# Patient Record
Sex: Female | Born: 1965 | Race: White | Hispanic: No | Marital: Married | Smoking: Never smoker
Health system: Southern US, Community
[De-identification: ages and names within clinical notes are randomized; demographics above are authoritative.]

## PROBLEM LIST (undated history)

## (undated) DIAGNOSIS — E042 Nontoxic multinodular goiter: Secondary | ICD-10-CM

## (undated) DIAGNOSIS — K219 Gastro-esophageal reflux disease without esophagitis: Secondary | ICD-10-CM

## (undated) DIAGNOSIS — R7303 Prediabetes: Secondary | ICD-10-CM

## (undated) DIAGNOSIS — G4730 Sleep apnea, unspecified: Secondary | ICD-10-CM

## (undated) DIAGNOSIS — Z8719 Personal history of other diseases of the digestive system: Secondary | ICD-10-CM

## (undated) DIAGNOSIS — G629 Polyneuropathy, unspecified: Secondary | ICD-10-CM

## (undated) DIAGNOSIS — Z8619 Personal history of other infectious and parasitic diseases: Secondary | ICD-10-CM

## (undated) DIAGNOSIS — R112 Nausea with vomiting, unspecified: Secondary | ICD-10-CM

## (undated) HISTORY — DX: Sleep apnea, unspecified: G47.30

## (undated) HISTORY — DX: Nontoxic multinodular goiter: E04.2

## (undated) HISTORY — DX: Prediabetes: R73.03

## (undated) HISTORY — PX: TONSILLECTOMY AND ADENOIDECTOMY: SUR1326

## (undated) HISTORY — DX: Personal history of other infectious and parasitic diseases: Z86.19

## (undated) HISTORY — PX: OTHER SURGICAL HISTORY: SHX169

## (undated) HISTORY — PX: SALIVARY GLAND SURGERY: SHX768

---

## 1997-01-28 HISTORY — PX: URETHRAL SLING: SHX2621

## 1997-07-15 ENCOUNTER — Ambulatory Visit (HOSPITAL_COMMUNITY): Admission: RE | Admit: 1997-07-15 | Discharge: 1997-07-16 | Payer: Self-pay | Admitting: Urology

## 1998-07-03 ENCOUNTER — Other Ambulatory Visit: Admission: RE | Admit: 1998-07-03 | Discharge: 1998-07-03 | Payer: Self-pay | Admitting: Gynecology

## 1999-07-04 ENCOUNTER — Other Ambulatory Visit: Admission: RE | Admit: 1999-07-04 | Discharge: 1999-07-04 | Payer: Self-pay | Admitting: Gynecology

## 2000-07-07 ENCOUNTER — Other Ambulatory Visit: Admission: RE | Admit: 2000-07-07 | Discharge: 2000-07-07 | Payer: Self-pay | Admitting: Gynecology

## 2000-12-13 ENCOUNTER — Emergency Department (HOSPITAL_COMMUNITY): Admission: EM | Admit: 2000-12-13 | Discharge: 2000-12-13 | Payer: Self-pay | Admitting: Emergency Medicine

## 2001-10-15 ENCOUNTER — Other Ambulatory Visit: Admission: RE | Admit: 2001-10-15 | Discharge: 2001-10-15 | Payer: Self-pay | Admitting: Gynecology

## 2004-12-26 ENCOUNTER — Ambulatory Visit (HOSPITAL_COMMUNITY): Admission: RE | Admit: 2004-12-26 | Discharge: 2004-12-26 | Payer: Self-pay | Admitting: *Deleted

## 2006-01-17 ENCOUNTER — Ambulatory Visit (HOSPITAL_COMMUNITY): Admission: RE | Admit: 2006-01-17 | Discharge: 2006-01-17 | Payer: Self-pay | Admitting: Obstetrics & Gynecology

## 2007-02-17 ENCOUNTER — Ambulatory Visit (HOSPITAL_COMMUNITY): Admission: RE | Admit: 2007-02-17 | Discharge: 2007-02-17 | Payer: Self-pay | Admitting: Obstetrics & Gynecology

## 2007-02-24 ENCOUNTER — Encounter: Admission: RE | Admit: 2007-02-24 | Discharge: 2007-02-24 | Payer: Self-pay | Admitting: Obstetrics & Gynecology

## 2007-06-27 ENCOUNTER — Ambulatory Visit: Payer: Self-pay | Admitting: Family Medicine

## 2008-02-26 ENCOUNTER — Ambulatory Visit (HOSPITAL_COMMUNITY): Admission: RE | Admit: 2008-02-26 | Discharge: 2008-02-26 | Payer: Self-pay | Admitting: Family Medicine

## 2008-03-25 ENCOUNTER — Encounter: Admission: RE | Admit: 2008-03-25 | Discharge: 2008-03-25 | Payer: Self-pay | Admitting: Otolaryngology

## 2008-06-24 ENCOUNTER — Ambulatory Visit: Payer: Self-pay | Admitting: Gynecology

## 2008-06-28 ENCOUNTER — Ambulatory Visit: Payer: Self-pay | Admitting: Gynecology

## 2008-07-12 ENCOUNTER — Ambulatory Visit: Payer: Self-pay | Admitting: Gynecology

## 2008-07-18 ENCOUNTER — Ambulatory Visit: Payer: Self-pay | Admitting: Gynecology

## 2008-07-18 HISTORY — PX: INTRAUTERINE DEVICE INSERTION: SHX323

## 2008-09-02 ENCOUNTER — Encounter: Payer: Self-pay | Admitting: Gynecology

## 2008-09-02 ENCOUNTER — Ambulatory Visit: Payer: Self-pay | Admitting: Gynecology

## 2008-09-02 ENCOUNTER — Other Ambulatory Visit: Admission: RE | Admit: 2008-09-02 | Discharge: 2008-09-02 | Payer: Self-pay | Admitting: Gynecology

## 2008-11-11 ENCOUNTER — Encounter: Payer: Self-pay | Admitting: Gynecology

## 2008-11-11 ENCOUNTER — Other Ambulatory Visit: Admission: RE | Admit: 2008-11-11 | Discharge: 2008-11-11 | Payer: Self-pay | Admitting: Gynecology

## 2008-11-11 ENCOUNTER — Ambulatory Visit: Payer: Self-pay | Admitting: Gynecology

## 2008-11-29 ENCOUNTER — Ambulatory Visit: Payer: Self-pay | Admitting: Gynecology

## 2009-02-28 ENCOUNTER — Ambulatory Visit (HOSPITAL_COMMUNITY): Admission: RE | Admit: 2009-02-28 | Discharge: 2009-02-28 | Payer: Self-pay | Admitting: Family Medicine

## 2009-03-31 ENCOUNTER — Encounter: Admission: RE | Admit: 2009-03-31 | Discharge: 2009-03-31 | Payer: Self-pay | Admitting: Internal Medicine

## 2009-09-05 ENCOUNTER — Ambulatory Visit: Payer: Self-pay | Admitting: Gynecology

## 2009-09-05 ENCOUNTER — Other Ambulatory Visit: Admission: RE | Admit: 2009-09-05 | Discharge: 2009-09-05 | Payer: Self-pay | Admitting: Gynecology

## 2009-09-08 ENCOUNTER — Ambulatory Visit: Payer: Self-pay | Admitting: Gynecology

## 2009-12-20 ENCOUNTER — Ambulatory Visit: Payer: Self-pay | Admitting: Gynecology

## 2009-12-20 ENCOUNTER — Other Ambulatory Visit: Admission: RE | Admit: 2009-12-20 | Discharge: 2009-12-20 | Payer: Self-pay | Admitting: Gynecology

## 2010-02-26 ENCOUNTER — Other Ambulatory Visit: Payer: Self-pay | Admitting: Gynecology

## 2010-02-26 DIAGNOSIS — Z139 Encounter for screening, unspecified: Secondary | ICD-10-CM

## 2010-03-01 ENCOUNTER — Ambulatory Visit (HOSPITAL_COMMUNITY)
Admission: RE | Admit: 2010-03-01 | Discharge: 2010-03-01 | Disposition: A | Discharge status: Home / Self Care | Payer: BC Managed Care – PPO | Source: Ambulatory Visit | Attending: Gynecology | Admitting: Gynecology

## 2010-03-01 DIAGNOSIS — Z139 Encounter for screening, unspecified: Secondary | ICD-10-CM

## 2010-03-01 DIAGNOSIS — Z1231 Encounter for screening mammogram for malignant neoplasm of breast: Secondary | ICD-10-CM

## 2010-09-04 ENCOUNTER — Encounter: Payer: Self-pay | Admitting: Anesthesiology

## 2010-09-11 ENCOUNTER — Other Ambulatory Visit (HOSPITAL_COMMUNITY)
Admission: RE | Admit: 2010-09-11 | Discharge: 2010-09-11 | Disposition: A | Discharge status: Home / Self Care | Payer: BC Managed Care – PPO | Source: Ambulatory Visit | Attending: Gynecology | Admitting: Gynecology

## 2010-09-11 ENCOUNTER — Ambulatory Visit (INDEPENDENT_AMBULATORY_CARE_PROVIDER_SITE_OTHER): Payer: BC Managed Care – PPO | Admitting: Gynecology

## 2010-09-11 ENCOUNTER — Encounter: Payer: Self-pay | Admitting: Gynecology

## 2010-09-11 DIAGNOSIS — Z01419 Encounter for gynecological examination (general) (routine) without abnormal findings: Secondary | ICD-10-CM

## 2010-09-11 DIAGNOSIS — O09299 Supervision of pregnancy with other poor reproductive or obstetric history, unspecified trimester: Secondary | ICD-10-CM

## 2010-09-11 DIAGNOSIS — R635 Abnormal weight gain: Secondary | ICD-10-CM

## 2010-09-11 DIAGNOSIS — Z13220 Encounter for screening for lipoid disorders: Secondary | ICD-10-CM

## 2010-09-11 NOTE — Progress Notes (Signed)
Miranda Hill 02/12/1965 [id number]   History:    45 y.o.  for annual exam she was asymptomatic. Review of her records indicated she was weighing 168 down to 162. She has a Mirena IUD was placed in June of 2010. She has a history of gestational diabetes in the past. Her father was recently diagnosed with insulin-dependent diabetes. Patient with prior history of suburethral sling in 1999 and has done well. Her mammogram was the early part of this year which was normal and she does her monthly self breast examinations. She is having no menstrual cycles with a Mirena IUD in place.  Past medical history,surgical history, family history and social history were all reviewed and documented in the EPIC chart. ROS:  Was performed and pertinent positives and negatives are included in the history.  Exam: chaperone present Filed Vitals:   09/11/10 1503  BP: 128/72   Body mass index is 30.48 kg/(m^2).  General appearance : Well developed well nourished female. Skin grossly normal HEENT: Neck supple, trachea midline Lungs: Clear to auscultation, no rhonchi or wheezes Heart: Regular rate and rhythm, no murmurs or gallops Breast:Examined in sitting and supine position were symmetrical in appearance, no palpable masses, to skin retraction, no nipple inversion, no nipple discharge and no axillary or supraclavicular lymphadenopathy Abdomen: no palpable masses or tenderness Pelvic  Ext/BUS/vagina  normal   Cervix  normal   Uterus  anteverted, normal size, shape and contour, midline and mobile nontender   Adnexa  Without masses or tenderness  Anus and perineum  normal   Rectovaginal  normal sphincter tone without palpated masses or tenderness             Hemoccult not done     Assessment/Plan:  45 y.o. female for annual exam otherwise unremarkable. Due to her past history of gestational diabetes and her father recently diagnosed with insulin-dependent diabetes we will do a random blood sugar today.  Because of her weight issues we'll be checking a TSH and a screening cholesterol a low with her CBC and urinalysis and Pap smear. She was encouraged to continue her monthly self breast examination. We'll see her back in one year or when necessary.    Ok Edwards MD, 4:06 PM 09/11/2010

## 2010-12-05 IMAGING — US US SOFT TISSUE HEAD/NECK
1 series · 14 of 25 positions shown · non-contrast
Comparison: Dictated report from ultrasound of the thyroid from
[HOSPITAL] dated 05/22/2007

CLINICAL DATA: Follow up of thyroid nodules

THYROID ULTRASOUND
TECHNIQUE: Ultrasound examination of the thyroid gland and
adjacent soft tissues was performed.

[Series 1: us soft tissue head/neck · 0.07mm/px · 14 of 34 slices shown]
[im 1/34]
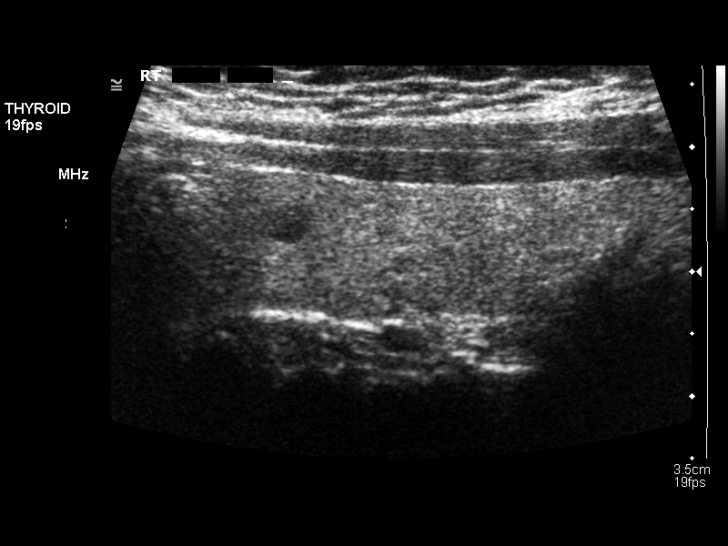
[im 3/34]
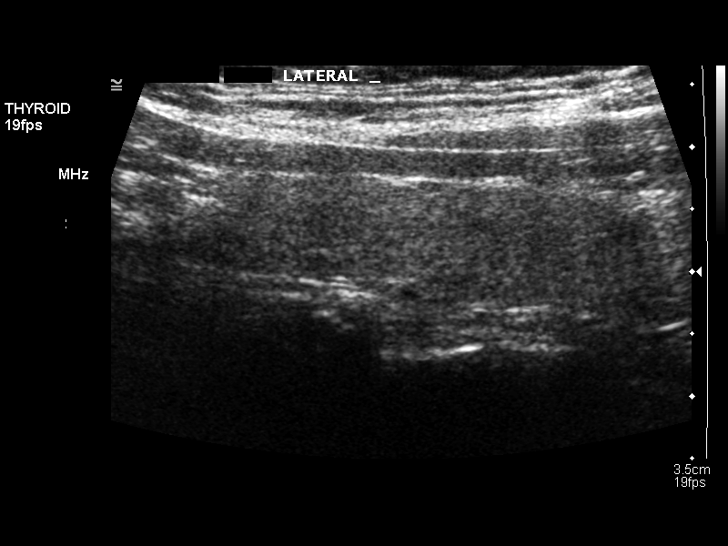
[im 6/34]
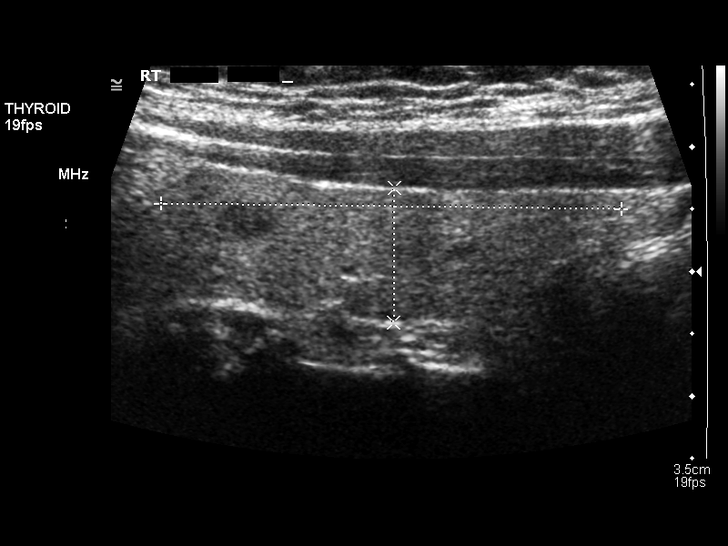
[im 9/34]
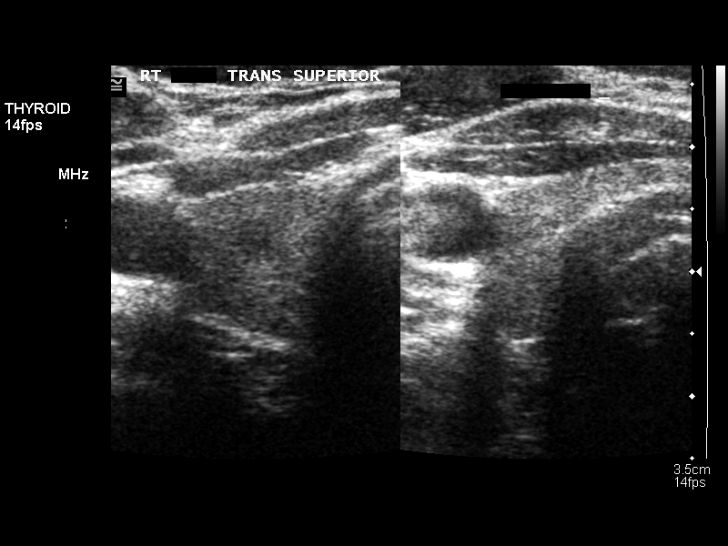
[im 12/34]
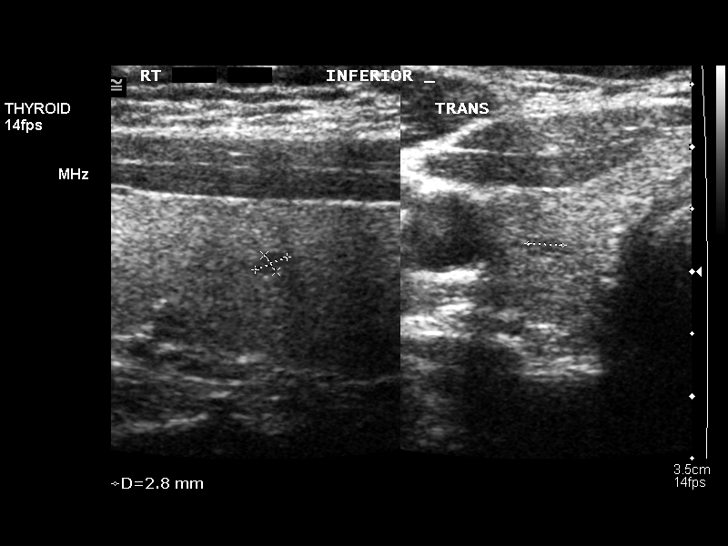
[im 13/34]
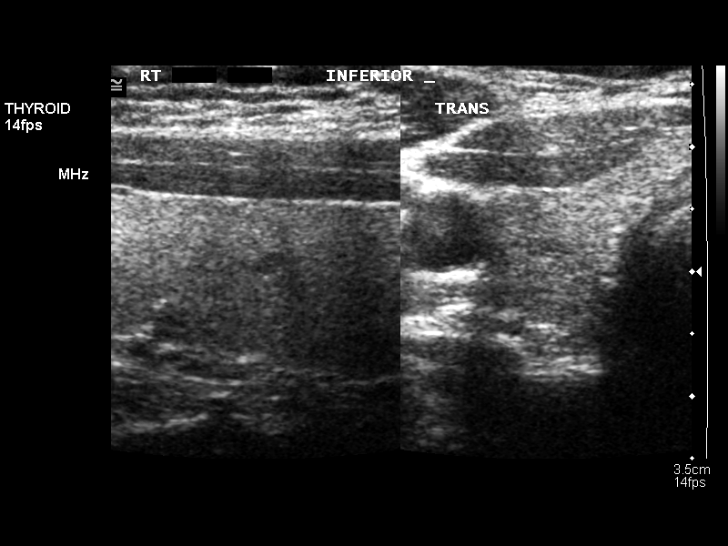
[im 16/34]
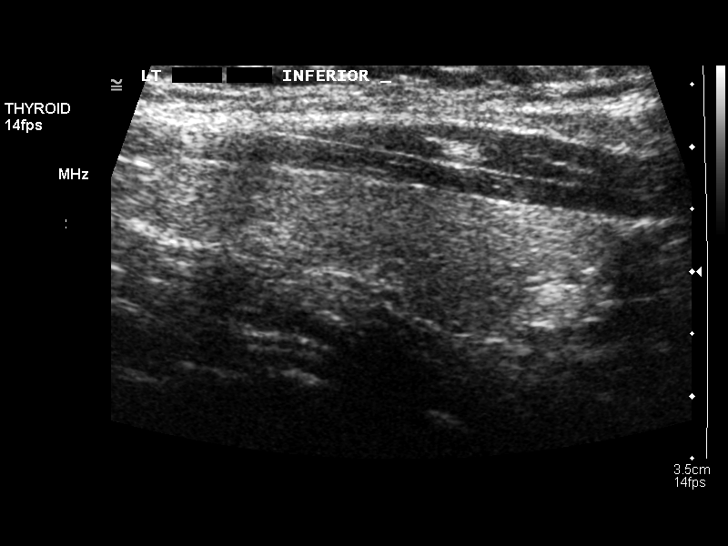
[im 18/34]
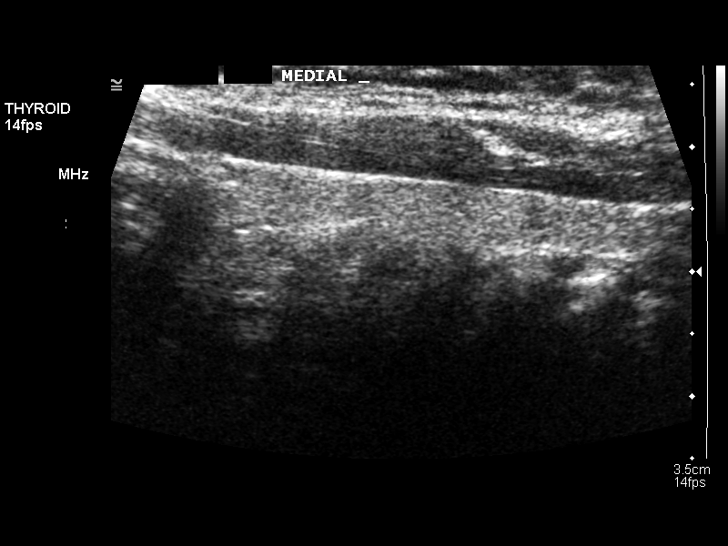
[im 21/34]
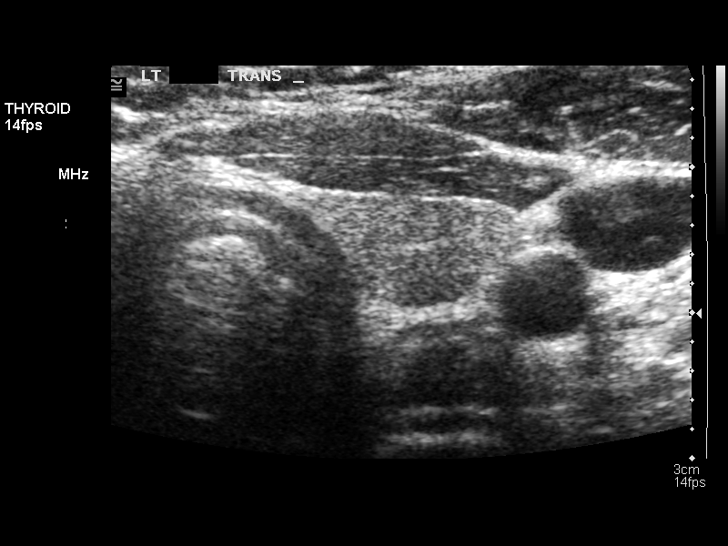
[im 23/34]
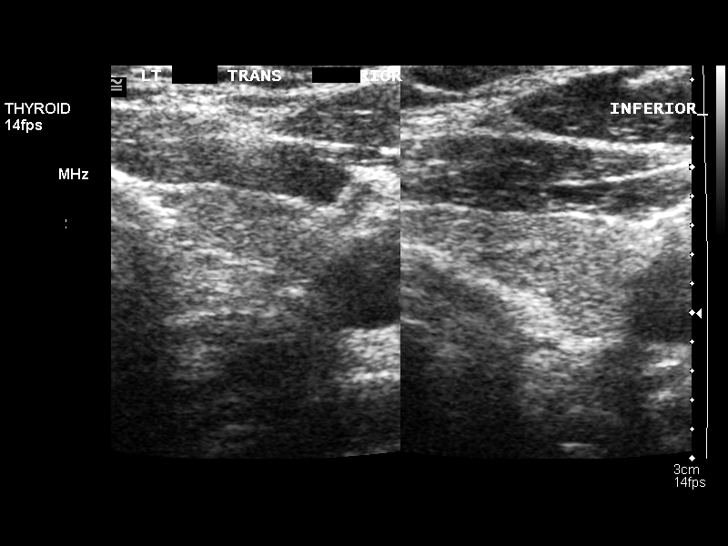
[im 25/34]
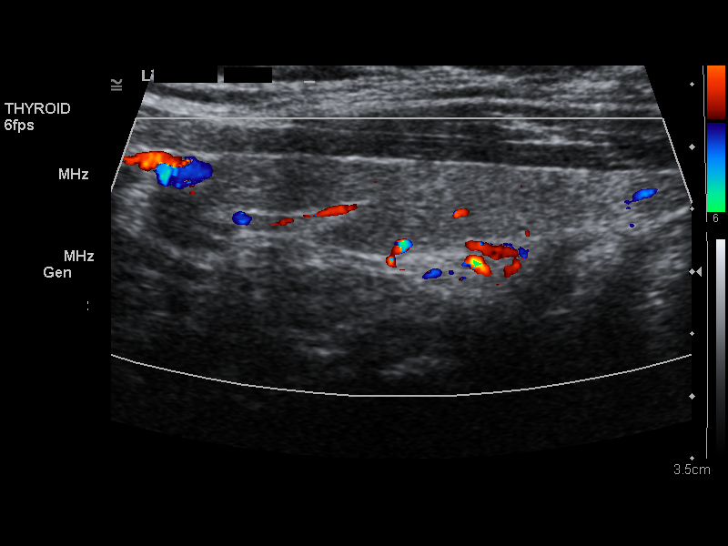
[im 28/34]
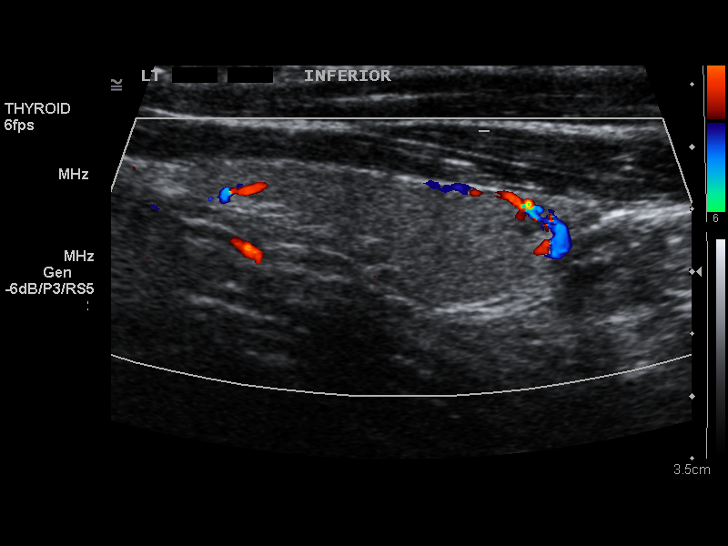
[im 31/34]
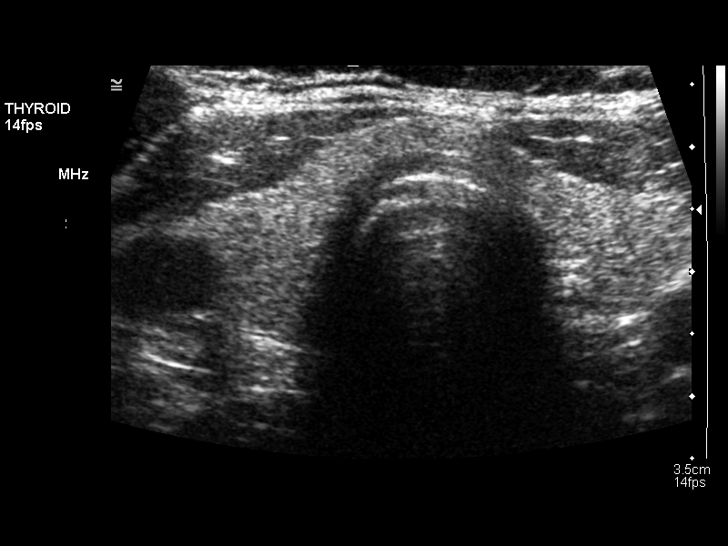
[im 34/34]
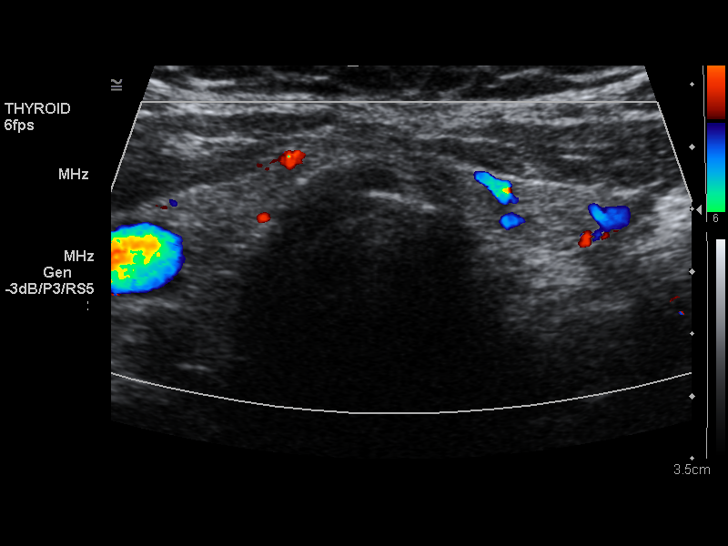

[14 of 25 positions shown; findings below may reference images not displayed]

FINDINGS: The thyroid gland is stable in size.  The right lobe
measures 4.1 cm sagittally with a depth of 1.0 cm and width of
cm.  (Prior measurements of 4.4 x 1.0 x 1.2 centimeter).  The left
lobe currently measures 4.1 x 0.9 x 1.3.  (Prior measurements of
4.2 x 0.8 x 1.4 cm).  The thyroid gland is homogeneous in
echogenicity.  Only small hypoechoic nodules are noted bilaterally
of no more than 5 mm in size.
IMPRESSION: The thyroid gland is normal in size with only small hypoechoic
nodules of no more than 5 mm in diameter present.

## 2011-02-11 ENCOUNTER — Other Ambulatory Visit: Payer: Self-pay | Admitting: Gynecology

## 2011-02-11 DIAGNOSIS — Z1231 Encounter for screening mammogram for malignant neoplasm of breast: Secondary | ICD-10-CM

## 2011-03-12 ENCOUNTER — Ambulatory Visit (HOSPITAL_COMMUNITY)
Admission: RE | Admit: 2011-03-12 | Discharge: 2011-03-12 | Disposition: A | Discharge status: Home / Self Care | Payer: BC Managed Care – PPO | Source: Ambulatory Visit | Attending: Gynecology | Admitting: Gynecology

## 2011-03-12 DIAGNOSIS — Z1231 Encounter for screening mammogram for malignant neoplasm of breast: Secondary | ICD-10-CM

## 2011-09-17 ENCOUNTER — Encounter: Payer: Self-pay | Admitting: Gynecology

## 2011-09-17 ENCOUNTER — Ambulatory Visit (INDEPENDENT_AMBULATORY_CARE_PROVIDER_SITE_OTHER): Payer: BC Managed Care – PPO | Admitting: Gynecology

## 2011-09-17 VITALS — BP 116/78 | Ht 62.25 in | Wt 155.0 lb

## 2011-09-17 DIAGNOSIS — N393 Stress incontinence (female) (male): Secondary | ICD-10-CM

## 2011-09-17 DIAGNOSIS — Z01419 Encounter for gynecological examination (general) (routine) without abnormal findings: Secondary | ICD-10-CM

## 2011-09-17 DIAGNOSIS — R635 Abnormal weight gain: Secondary | ICD-10-CM

## 2011-09-17 DIAGNOSIS — N3946 Mixed incontinence: Secondary | ICD-10-CM | POA: Insufficient documentation

## 2011-09-17 DIAGNOSIS — F3281 Premenstrual dysphoric disorder: Secondary | ICD-10-CM | POA: Insufficient documentation

## 2011-09-17 DIAGNOSIS — N943 Premenstrual tension syndrome: Secondary | ICD-10-CM

## 2011-09-17 DIAGNOSIS — Z8632 Personal history of gestational diabetes: Secondary | ICD-10-CM

## 2011-09-17 LAB — CBC WITH DIFFERENTIAL/PLATELET
Basophils Absolute: 0 10*3/uL (ref 0.0–0.1)
Basophils Relative: 0 % (ref 0–1)
Eosinophils Relative: 2 % (ref 0–5)
HCT: 36.8 % (ref 36.0–46.0)
Hemoglobin: 12.7 g/dL (ref 12.0–15.0)
Lymphocytes Relative: 30 % (ref 12–46)
Lymphs Abs: 2.2 10*3/uL (ref 0.7–4.0)
MCH: 30.1 pg (ref 26.0–34.0)
MCHC: 34.5 g/dL (ref 30.0–36.0)
MCV: 87.2 fL (ref 78.0–100.0)
Monocytes Absolute: 0.5 10*3/uL (ref 0.1–1.0)
Monocytes Relative: 6 % (ref 3–12)
Neutro Abs: 4.3 10*3/uL (ref 1.7–7.7)
Neutrophils Relative %: 62 % (ref 43–77)
RBC: 4.22 MIL/uL (ref 3.87–5.11)
RDW: 13.5 % (ref 11.5–15.5)
WBC: 7.1 10*3/uL (ref 4.0–10.5)

## 2011-09-17 LAB — CHOLESTEROL, TOTAL: Cholesterol: 192 mg/dL (ref 0–200)

## 2011-09-17 LAB — HEMOGLOBIN A1C
Hgb A1c MFr Bld: 5.5 % (ref <5.7)
Mean Plasma Glucose: 111 mg/dL (ref <117)

## 2011-09-17 NOTE — Patient Instructions (Addendum)
Health Maintenance, Females A healthy lifestyle and preventative care can promote health and wellness.  Maintain regular health, dental, and eye exams.   Eat a healthy diet. Foods like vegetables, fruits, whole grains, low-fat dairy products, and lean protein foods contain the nutrients you need without too many calories. Decrease your intake of foods high in solid fats, added sugars, and salt. Get information about a proper diet from your caregiver, if necessary.   Regular physical exercise is one of the most important things you can do for your health. Most adults should get at least 150 minutes of moderate-intensity exercise (any activity that increases your heart rate and causes you to sweat) each week. In addition, most adults need muscle-strengthening exercises on 2 or more days a week.    Maintain a healthy weight. The body mass index (BMI) is a screening tool to identify possible weight problems. It provides an estimate of body fat based on height and weight. Your caregiver can help determine your BMI, and can help you achieve or maintain a healthy weight. For adults 20 years and older:   A BMI below 18.5 is considered underweight.   A BMI of 18.5 to 24.9 is normal.   A BMI of 25 to 29.9 is considered overweight.   A BMI of 30 and above is considered obese.   Maintain normal blood lipids and cholesterol by exercising and minimizing your intake of saturated fat. Eat a balanced diet with plenty of fruits and vegetables. Blood tests for lipids and cholesterol should begin at age 20 and be repeated every 5 years. If your lipid or cholesterol levels are high, you are over 50, or you are a high risk for heart disease, you may need your cholesterol levels checked more frequently.Ongoing high lipid and cholesterol levels should be treated with medicines if diet and exercise are not effective.   If you smoke, find out from your caregiver how to quit. If you do not use tobacco, do not start.    If you are pregnant, do not drink alcohol. If you are breastfeeding, be very cautious about drinking alcohol. If you are not pregnant and choose to drink alcohol, do not exceed 1 drink per day. One drink is considered to be 12 ounces (355 mL) of beer, 5 ounces (148 mL) of wine, or 1.5 ounces (44 mL) of liquor.   Avoid use of street drugs. Do not share needles with anyone. Ask for help if you need support or instructions about stopping the use of drugs.   High blood pressure causes heart disease and increases the risk of stroke. Blood pressure should be checked at least every 1 to 2 years. Ongoing high blood pressure should be treated with medicines, if weight loss and exercise are not effective.   If you are 55 to 47 years old, ask your caregiver if you should take aspirin to prevent strokes.   Diabetes screening involves taking a blood sample to check your fasting blood sugar level. This should be done once every 3 years, after age 45, if you are within normal weight and without risk factors for diabetes. Testing should be considered at a younger age or be carried out more frequently if you are overweight and have at least 1 risk factor for diabetes.   Breast cancer screening is essential preventative care for women. You should practice "breast self-awareness." This means understanding the normal appearance and feel of your breasts and may include breast self-examination. Any changes detected, no matter how   small, should be reported to a caregiver. Women in their 20s and 30s should have a clinical breast exam (CBE) by a caregiver as part of a regular health exam every 1 to 3 years. After age 40, women should have a CBE every year. Starting at age 40, women should consider having a mammogram (breast X-ray) every year. Women who have a family history of breast cancer should talk to their caregiver about genetic screening. Women at a high risk of breast cancer should talk to their caregiver about having  an MRI and a mammogram every year.   The Pap test is a screening test for cervical cancer. Women should have a Pap test starting at age 21. Between ages 21 and 29, Pap tests should be repeated every 2 years. Beginning at age 30, you should have a Pap test every 3 years as long as the past 3 Pap tests have been normal. If you had a hysterectomy for a problem that was not cancer or a condition that could lead to cancer, then you no longer need Pap tests. If you are between ages 65 and 70, and you have had normal Pap tests going back 10 years, you no longer need Pap tests. If you have had past treatment for cervical cancer or a condition that could lead to cancer, you need Pap tests and screening for cancer for at least 20 years after your treatment. If Pap tests have been discontinued, risk factors (such as a new sexual partner) need to be reassessed to determine if screening should be resumed. Some women have medical problems that increase the chance of getting cervical cancer. In these cases, your caregiver may recommend more frequent screening and Pap tests.   The human papillomavirus (HPV) test is an additional test that may be used for cervical cancer screening. The HPV test looks for the virus that can cause the cell changes on the cervix. The cells collected during the Pap test can be tested for HPV. The HPV test could be used to screen women aged 30 years and older, and should be used in women of any age who have unclear Pap test results. After the age of 30, women should have HPV testing at the same frequency as a Pap test.   Colorectal cancer can be detected and often prevented. Most routine colorectal cancer screening begins at the age of 50 and continues through age 75. However, your caregiver may recommend screening at an earlier age if you have risk factors for colon cancer. On a yearly basis, your caregiver may provide home test kits to check for hidden blood in the stool. Use of a small camera at  the end of a tube, to directly examine the colon (sigmoidoscopy or colonoscopy), can detect the earliest forms of colorectal cancer. Talk to your caregiver about this at age 50, when routine screening begins. Direct examination of the colon should be repeated every 5 to 10 years through age 75, unless early forms of pre-cancerous polyps or small growths are found.   Hepatitis C blood testing is recommended for all people born from 1945 through 1965 and any individual with known risks for hepatitis C.   Practice safe sex. Use condoms and avoid high-risk sexual practices to reduce the spread of sexually transmitted infections (STIs). Sexually active women aged 25 and younger should be checked for Chlamydia, which is a common sexually transmitted infection. Older women with new or multiple partners should also be tested for Chlamydia. Testing for other   STIs is recommended if you are sexually active and at increased risk.   Osteoporosis is a disease in which the bones lose minerals and strength with aging. This can result in serious bone fractures. The risk of osteoporosis can be identified using a bone density scan. Women ages 65 and over and women at risk for fractures or osteoporosis should discuss screening with their caregivers. Ask your caregiver whether you should be taking a calcium supplement or vitamin D to reduce the rate of osteoporosis.   Menopause can be associated with physical symptoms and risks. Hormone replacement therapy is available to decrease symptoms and risks. You should talk to your caregiver about whether hormone replacement therapy is right for you.   Use sunscreen with a sun protection factor (SPF) of 30 or greater. Apply sunscreen liberally and repeatedly throughout the day. You should seek shade when your shadow is shorter than you. Protect yourself by wearing long sleeves, pants, a wide-brimmed hat, and sunglasses year round, whenever you are outdoors.   Notify your caregiver  of new moles or changes in moles, especially if there is a change in shape or color. Also notify your caregiver if a mole is larger than the size of a pencil eraser.   Stay current with your immunizations.  Document Released: 07/30/2010 Document Revised: 01/03/2011 Document Reviewed: 07/30/2010 [employer] Patient Information 2012 [employer].  Exercise to Lose Weight Exercise and a healthy diet may help you lose weight. Your doctor may suggest specific exercises. EXERCISE IDEAS AND TIPS  Choose low-cost things you enjoy doing, such as walking, bicycling, or exercising to workout videos.   Take stairs instead of the elevator.   Walk during your lunch break.   Park your car further away from work or school.   Go to a gym or an exercise class.   Start with 5 to 10 minutes of exercise each day. Build up to 30 minutes of exercise 4 to 6 days a week.   Wear shoes with good support and comfortable clothes.   Stretch before and after working out.   Work out until you breathe harder and your heart beats faster.   Drink extra water when you exercise.   Do not do so much that you hurt yourself, feel dizzy, or get very short of breath.  Exercises that burn about 150 calories:  Running 1  miles in 15 minutes.   Playing volleyball for 45 to 60 minutes.   Washing and waxing a car for 45 to 60 minutes.   Playing touch football for 45 minutes.   Walking 1  miles in 35 minutes.   Pushing a stroller 1  miles in 30 minutes.   Playing basketball for 30 minutes.   Raking leaves for 30 minutes.   Bicycling 5 miles in 30 minutes.   Walking 2 miles in 30 minutes.   Dancing for 30 minutes.   Shoveling snow for 15 minutes.   Swimming laps for 20 minutes.   Walking up stairs for 15 minutes.   Bicycling 4 miles in 15 minutes.   Gardening for 30 to 45 minutes.   Jumping rope for 15 minutes.   Washing windows or floors for 45 to 60 minutes.  Document Released: 02/16/2010  Document Revised: 09/26/2010 Document Reviewed: 02/16/2010 [employer] Patient Information 2012 [employer].                                                      Cholesterol Control Diet  Cholesterol levels in your body are determined significantly by your diet. Cholesterol levels may also be related to heart disease. The following material helps to explain this relationship and discusses what you can do to help keep your heart healthy. Not all cholesterol is bad. Low-density lipoprotein (LDL) cholesterol is the "bad" cholesterol. It may cause fatty deposits to build up inside your arteries. High-density lipoprotein (HDL) cholesterol is "good." It helps to remove the "bad" LDL cholesterol from your blood. Cholesterol is a very important risk factor for heart disease. Other risk factors are high blood pressure, smoking, stress, heredity, and weight. The heart muscle gets its supply of blood through the coronary arteries. If your LDL cholesterol is high and your HDL cholesterol is low, you are at risk for having fatty deposits build up in your coronary arteries. This leaves less room through which blood can flow. Without sufficient blood and oxygen, the heart muscle cannot function properly and you may feel chest pains (angina pectoris). When a coronary artery closes up entirely, a part of the heart muscle may die, causing a heart attack (myocardial infarction). CHECKING CHOLESTEROL When your caregiver sends your blood to a lab to be analyzed for cholesterol, a complete lipid (fat) profile may be done. With this test, the total amount of cholesterol and levels of LDL and HDL are determined. Triglycerides are a type of fat that circulates in the blood and can also be used to determine heart disease risk. The list below describes what the numbers should be: Test: Total Cholesterol.  Less than 200 mg/dl.  Test: LDL "bad cholesterol."  Less than 100 mg/dl.   Less than 70 mg/dl if you are at very high risk  of a heart attack or sudden cardiac death.  Test: HDL "good cholesterol."  Greater than 50 mg/dl for women.   Greater than 40 mg/dl for men.  Test: Triglycerides.  Less than 150 mg/dl.  CONTROLLING CHOLESTEROL WITH DIET Although exercise and lifestyle factors are important, your diet is key. That is because certain foods are known to raise cholesterol and others to lower it. The goal is to balance foods for their effect on cholesterol and more importantly, to replace saturated and trans fat with other types of fat, such as monounsaturated fat, polyunsaturated fat, and omega-3 fatty acids. On average, a person should consume no more than 15 to 17 g of saturated fat daily. Saturated and trans fats are considered "bad" fats, and they will raise LDL cholesterol. Saturated fats are primarily found in animal products such as meats, butter, and cream. However, that does not mean you need to sacrifice all your favorite foods. Today, there are good tasting, low-fat, low-cholesterol substitutes for most of the things you like to eat. Choose low-fat or nonfat alternatives. Choose round or loin cuts of red meat, since these types of cuts are lowest in fat and cholesterol. Chicken (without the skin), fish, veal, and ground turkey breast are excellent choices. Eliminate fatty meats, such as hot dogs and salami. Even shellfish have little or no saturated fat. Have a 3 oz (85 g) portion when you eat lean meat, poultry, or fish. Trans fats are also called "partially hydrogenated oils." They are oils that have been scientifically manipulated so that they are solid at room temperature resulting in a longer shelf life and improved taste and texture of foods in which they are added. Trans fats are found in stick margarine, some tub margarines, cookies, crackers, and baked goods.    When baking and cooking, oils are an excellent substitute for butter. The monounsaturated oils are especially beneficial since it is believed they  lower LDL and raise HDL. The oils you should avoid entirely are saturated tropical oils, such as coconut and palm.  Remember to eat liberally from food groups that are naturally free of saturated and trans fat, including fish, fruit, vegetables, beans, grains (barley, rice, couscous, bulgur wheat), and pasta (without cream sauces).  IDENTIFYING FOODS THAT LOWER CHOLESTEROL  Soluble fiber may lower your cholesterol. This type of fiber is found in fruits such as apples, vegetables such as broccoli, potatoes, and carrots, legumes such as beans, peas, and lentils, and grains such as barley. Foods fortified with plant sterols (phytosterol) may also lower cholesterol. You should eat at least 2 g per day of these foods for a cholesterol lowering effect.  Read package labels to identify low-saturated fats, trans fats free, and low-fat foods at the supermarket. Select cheeses that have only 2 to 3 g saturated fat per ounce. Use a heart-healthy tub margarine that is free of trans fats or partially hydrogenated oil. When buying baked goods (cookies, crackers), avoid partially hydrogenated oils. Breads and muffins should be made from whole grains (whole-wheat or whole oat flour, instead of "flour" or "enriched flour"). Buy non-creamy canned soups with reduced salt and no added fats.  FOOD PREPARATION TECHNIQUES  Never deep-fry. If you must fry, either stir-fry, which uses very little fat, or use non-stick cooking sprays. When possible, broil, bake, or roast meats, and steam vegetables. Instead of dressing vegetables with butter or margarine, use lemon and herbs, applesauce and cinnamon (for squash and sweet potatoes), nonfat yogurt, salsa, and low-fat dressings for salads.  LOW-SATURATED FAT / LOW-FAT FOOD SUBSTITUTES Meats / Saturated Fat (g)  Avoid: Steak, marbled (3 oz/85 g) / 11 g   Choose: Steak, lean (3 oz/85 g) / 4 g   Avoid: Hamburger (3 oz/85 g) / 7 g   Choose: Hamburger, lean (3 oz/85 g) / 5 g    Avoid: Ham (3 oz/85 g) / 6 g   Choose: Ham, lean cut (3 oz/85 g) / 2.4 g   Avoid: Chicken, with skin, dark meat (3 oz/85 g) / 4 g   Choose: Chicken, skin removed, dark meat (3 oz/85 g) / 2 g   Avoid: Chicken, with skin, light meat (3 oz/85 g) / 2.5 g   Choose: Chicken, skin removed, light meat (3 oz/85 g) / 1 g  Dairy / Saturated Fat (g)  Avoid: Whole milk (1 cup) / 5 g   Choose: Low-fat milk, 2% (1 cup) / 3 g   Choose: Low-fat milk, 1% (1 cup) / 1.5 g   Choose: Skim milk (1 cup) / 0.3 g   Avoid: Hard cheese (1 oz/28 g) / 6 g   Choose: Skim milk cheese (1 oz/28 g) / 2 to 3 g   Avoid: Cottage cheese, 4% fat (1 cup) / 6.5 g   Choose: Low-fat cottage cheese, 1% fat (1 cup) / 1.5 g   Avoid: Ice cream (1 cup) / 9 g   Choose: Sherbet (1 cup) / 2.5 g   Choose: Nonfat frozen yogurt (1 cup) / 0.3 g   Choose: Frozen fruit bar / trace   Avoid: Whipped cream (1 tbs) / 3.5 g   Choose: Nondairy whipped topping (1 tbs) / 1 g  Condiments / Saturated Fat (g)  Avoid: Mayonnaise (1 tbs) / 2 g   Choose: Low-fat   mayonnaise (1 tbs) / 1 g   Avoid: Butter (1 tbs) / 7 g   Choose: Extra light margarine (1 tbs) / 1 g   Avoid: Coconut oil (1 tbs) / 11.8 g   Choose: Olive oil (1 tbs) / 1.8 g   Choose: Corn oil (1 tbs) / 1.7 g   Choose: Safflower oil (1 tbs) / 1.2 g   Choose: Sunflower oil (1 tbs) / 1.4 g   Choose: Soybean oil (1 tbs) / 2.4 g   Choose: Canola oil (1 tbs) / 1 g  Document Released: 01/14/2005 Document Revised: 09/26/2010 Document Reviewed: 07/05/2010 [employer] Patient Information 2012 [employer].   

## 2011-09-17 NOTE — Progress Notes (Signed)
Miranda Hill 04-06-1965 [id number]   History:    46 y.o.  for annual gyn exam who has a Mirena IUD that was placed in 2010 for menstrual control. Patient states that for several days once a month she gets very moody and depressed. She does have a past history of depression and has a strong family history of depression. Patient currently on no antidepressant agent. Patient also is complaining of persistent stretching or incontinence with jumping sneezing her exercising she wears a pad all the time. In 1999 she had a suburethral sling placed by Dr.Wrenn (urologist). Review of her record indicated that she was weighing 162 and is down to 155 with a BMI of 28.12. Her last Pap smear was in 2011 which was normal. She's had always normal Pap smears.  In 2009 patient underwent an excisional biopsy of the left cervical neck mass by Dr.Teoh which was benign  Past medical history,surgical history, family history and social history were all reviewed and documented in the EPIC chart.  Gynecologic History No LMP recorded. Patient is not currently having periods (Reason: IUD). Contraception: IUD and vasectomy Last Pap: 2011. Results were: normal Last mammogram: 2013. Results were: normal  Obstetric History OB History    Grav Para Term Preterm Abortions TAB SAB Ect Mult Living   3 2   1  1   2      # Outc Date GA Lbr Len/2nd Wgt Sex Del Anes PTL Lv   1 PAR            2 PAR            3 SAB                ROS: A ROS was performed and pertinent positives and negatives are included in the history.  GENERAL: No fevers or chills. HEENT: No change in vision, no earache, sore throat or sinus congestion. NECK: No pain or stiffness. CARDIOVASCULAR: No chest pain or pressure. No palpitations. PULMONARY: No shortness of breath, cough or wheeze. GASTROINTESTINAL: No abdominal pain, nausea, vomiting or diarrhea, melena or bright red blood per rectum. GENITOURINARY: No urinary frequency, urgency, hesitancy or  dysuria. MUSCULOSKELETAL: No joint or muscle pain, no back pain, no recent trauma. DERMATOLOGIC: No rash, no itching, no lesions. ENDOCRINE: No polyuria, polydipsia, no heat or cold intolerance. No recent change in weight. HEMATOLOGICAL: No anemia or easy bruising or bleeding. NEUROLOGIC: No headache, seizures, numbness, tingling or weakness. PSYCHIATRIC: No depression, no loss of interest in normal activity or change in sleep pattern.     Exam: chaperone present  BP 116/78  Ht 5' 2.25" (1.581 m)  Wt 155 lb (70.308 kg)  BMI 28.12 kg/m2  Body mass index is 28.12 kg/(m^2).  General appearance : Well developed well nourished female. No acute distress HEENT: Neck supple, trachea midline, no carotid bruits, no thyroidmegaly Lungs: Clear to auscultation, no rhonchi or wheezes, or rib retractions  Heart: Regular rate and rhythm, no murmurs or gallops Breast:Examined in sitting and supine position were symmetrical in appearance, no palpable masses or tenderness,  no skin retraction, no nipple inversion, no nipple discharge, no skin discoloration, no axillary or supraclavicular lymphadenopathy Abdomen: no palpable masses or tenderness, no rebound or guarding Extremities: no edema or skin discoloration or tenderness  Pelvic:  Bartholin, Urethra, Skene Glands: Within normal limits             Vagina: No gross lesions or discharge  Cervix: No gross lesions or discharge, IUD  string seen  Uterus  anteverted, normal size, shape and consistency, non-tender and mobile  Adnexa  Without masses or tenderness  Anus and perineum  normal   Rectovaginal  normal sphincter tone without palpated masses or tenderness             Hemoccult not done     Assessment/Plan:  46 y.o. female for annual exam with persistent stress urinary incontinence. Patient will need to be followed up by urologist Dr. Annabell Howells who had done her sling procedure back in 1999. She appears to have good supports and did not leak on Valsalva  in the supine position. We did discuss the new Pap smear screening guidelines and she will not need a Pap smear until next year. She was encouraged to do her monthly self breast examination. We discussed importance of calcium vitamin D for osteoporosis prevention. The following labs will be drawn today: Hemoglobin A1c, CBC, total cholesterol, urinalysis, and TSH. She will return back to the office when she returns back from her honeymoon to have her IUD removed since her partner has had a vasectomy. We will then monitor her cycles to see if there back to normal. If she does experience heavy periods we had discussed about in the near future proceeding with an endometrial ablation. She was instructed to continue to do her monthly self breast examination. We discussed importance of calcium vitamin D for osteoporosis prevention along with regular exercise. Literature information on cholesterol-lowering diet as well as on exercise was provided. We did discuss that some of the symptoms such as for depression but she feels for a few days of the month may be attributed to the Mirena IUD levonorgestrel-containing product. We did offer her perhaps to place her on Prozac 10 mg to take 2 weeks on 2 weeks off for her PMDD. She's decided to return back and remove the IUD and monitor the symptoms.    Ok Edwards MD, 3:02 PM 09/17/2011

## 2011-09-18 LAB — URINALYSIS W MICROSCOPIC + REFLEX CULTURE
Bilirubin Urine: NEGATIVE
Casts: NONE SEEN
Crystals: NONE SEEN
Nitrite: NEGATIVE
Protein, ur: NEGATIVE mg/dL
Specific Gravity, Urine: 1.031 — ABNORMAL HIGH (ref 1.005–1.030)
Squamous Epithelial / HPF: NONE SEEN
Urobilinogen, UA: 0.2 mg/dL (ref 0.0–1.0)
pH: 5.5 (ref 5.0–8.0)

## 2011-09-18 LAB — TSH: TSH: 2.256 u[IU]/mL (ref 0.350–4.500)

## 2011-10-18 ENCOUNTER — Ambulatory Visit (INDEPENDENT_AMBULATORY_CARE_PROVIDER_SITE_OTHER): Payer: BC Managed Care – PPO | Admitting: Gynecology

## 2011-10-18 ENCOUNTER — Encounter: Payer: Self-pay | Admitting: Gynecology

## 2011-10-18 VITALS — BP 122/70

## 2011-10-18 DIAGNOSIS — Z30432 Encounter for removal of intrauterine contraceptive device: Secondary | ICD-10-CM

## 2011-10-18 DIAGNOSIS — R4586 Emotional lability: Secondary | ICD-10-CM | POA: Insufficient documentation

## 2011-10-18 DIAGNOSIS — F39 Unspecified mood [affective] disorder: Secondary | ICD-10-CM

## 2011-10-18 NOTE — Progress Notes (Signed)
Patient is a 46 year old who presented to the office today requesting her Mirena IUD be removed. Review of her record indicated that she had placed in 2010. Her husband has had a vasectomy.Patient states that for several days once a month she gets very moody and depressed. She does have a past history of depression and has a strong family history of depression. Patient currently on no antidepressant agent.  Pelvic exam: Bartholin urethra Skene glands: Within normal limits Vagina: No lesions or discharge Cervix: IUD string was visualized it was grasped with a [place] clamp retrieved shown to the patient and discarded.  Assessment/plan: Patient will monitor her menstrual cycle the next few months. She stated her primary physician had done a few years ago and was "perimenopausal". We'll monitor her menstrual cycle the course of the next few months. She will followup with Dr. Annabell Howells urologist who did her sling many years ago since she continues to complain of stress urinary incontinence.

## 2012-03-16 ENCOUNTER — Other Ambulatory Visit: Payer: Self-pay | Admitting: Gynecology

## 2012-03-16 DIAGNOSIS — Z1231 Encounter for screening mammogram for malignant neoplasm of breast: Secondary | ICD-10-CM

## 2012-03-27 ENCOUNTER — Ambulatory Visit (HOSPITAL_COMMUNITY)
Admission: RE | Admit: 2012-03-27 | Discharge: 2012-03-27 | Disposition: A | Discharge status: Home / Self Care | Payer: BC Managed Care – PPO | Source: Ambulatory Visit | Attending: Gynecology | Admitting: Gynecology

## 2012-03-27 DIAGNOSIS — Z1231 Encounter for screening mammogram for malignant neoplasm of breast: Secondary | ICD-10-CM | POA: Insufficient documentation

## 2012-03-31 ENCOUNTER — Other Ambulatory Visit: Payer: Self-pay | Admitting: *Deleted

## 2012-03-31 ENCOUNTER — Other Ambulatory Visit: Payer: Self-pay | Admitting: Gynecology

## 2012-03-31 DIAGNOSIS — R928 Other abnormal and inconclusive findings on diagnostic imaging of breast: Secondary | ICD-10-CM

## 2012-03-31 DIAGNOSIS — N6320 Unspecified lump in the left breast, unspecified quadrant: Secondary | ICD-10-CM

## 2012-04-10 ENCOUNTER — Ambulatory Visit
Admission: RE | Admit: 2012-04-10 | Discharge: 2012-04-10 | Disposition: A | Discharge status: Home / Self Care | Payer: BC Managed Care – PPO | Source: Ambulatory Visit | Attending: Gynecology | Admitting: Gynecology

## 2012-04-10 DIAGNOSIS — R928 Other abnormal and inconclusive findings on diagnostic imaging of breast: Secondary | ICD-10-CM

## 2012-09-17 ENCOUNTER — Encounter: Payer: Self-pay | Admitting: Gynecology

## 2012-09-18 ENCOUNTER — Ambulatory Visit (INDEPENDENT_AMBULATORY_CARE_PROVIDER_SITE_OTHER): Payer: BC Managed Care – PPO | Admitting: Gynecology

## 2012-09-18 ENCOUNTER — Encounter: Payer: Self-pay | Admitting: Gynecology

## 2012-09-18 VITALS — BP 130/80 | Ht 62.25 in | Wt 172.0 lb

## 2012-09-18 DIAGNOSIS — N912 Amenorrhea, unspecified: Secondary | ICD-10-CM

## 2012-09-18 DIAGNOSIS — N951 Menopausal and female climacteric states: Secondary | ICD-10-CM

## 2012-09-18 DIAGNOSIS — Z01419 Encounter for gynecological examination (general) (routine) without abnormal findings: Secondary | ICD-10-CM

## 2012-09-18 DIAGNOSIS — R635 Abnormal weight gain: Secondary | ICD-10-CM

## 2012-09-18 DIAGNOSIS — Z83719 Family history of colon polyps, unspecified: Secondary | ICD-10-CM

## 2012-09-18 DIAGNOSIS — Z8371 Family history of colonic polyps: Secondary | ICD-10-CM

## 2012-09-18 DIAGNOSIS — Z833 Family history of diabetes mellitus: Secondary | ICD-10-CM

## 2012-09-18 LAB — CHOLESTEROL, TOTAL: Cholesterol: 209 mg/dL — ABNORMAL HIGH (ref 0–200)

## 2012-09-18 LAB — CBC WITH DIFFERENTIAL/PLATELET
Eosinophils Absolute: 0.2 10*3/uL (ref 0.0–0.7)
Eosinophils Relative: 2 % (ref 0–5)
Hemoglobin: 13.3 g/dL (ref 12.0–15.0)
Lymphs Abs: 2 10*3/uL (ref 0.7–4.0)
MCH: 29.9 pg (ref 26.0–34.0)
MCV: 87.9 fL (ref 78.0–100.0)
Monocytes Relative: 7 % (ref 3–12)
Neutrophils Relative %: 63 % (ref 43–77)
RBC: 4.45 MIL/uL (ref 3.87–5.11)

## 2012-09-18 LAB — COMPREHENSIVE METABOLIC PANEL
Albumin: 4.3 g/dL (ref 3.5–5.2)
CO2: 31 mEq/L (ref 19–32)
Calcium: 9.3 mg/dL (ref 8.4–10.5)
Chloride: 102 mEq/L (ref 96–112)
Glucose, Bld: 108 mg/dL — ABNORMAL HIGH (ref 70–99)
Sodium: 140 mEq/L (ref 135–145)
Total Bilirubin: 0.6 mg/dL (ref 0.3–1.2)
Total Protein: 7.3 g/dL (ref 6.0–8.3)

## 2012-09-18 LAB — TSH: TSH: 2.671 u[IU]/mL (ref 0.350–4.500)

## 2012-09-18 NOTE — Progress Notes (Signed)
Miranda Hill 1965/05/12 [id number]   History:    47 y.o.  for annual gyn exam who stated that she has not had a menstrual cycle since September 2013 when her brain IUD was removed.her husband has a vasectomy. The patient has no vasomotor symptoms. Patient with prior suburethral sling procedure in 1999 for stress urinary incontinence. The patient stated that her twin sister recently when into the menopause and also was diagnosed with colon polyps just like her mother.  Patient with strong family history of diabetes as follows: The patient herself had gestational diabetes Her son recently was diagnosed with type 1 diabetes on insulin Mother non-insulin-dependent diabetic Father insulin-dependent diabetic  Patient mammogram March 2014 small left breast cyst appears to be benign recommendation followup in one year. Patient with no prior history of abnormal Pap smears. 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 Review of patient's records indicate she was weighing 155 and it's now weighing 172.  Past medical history,surgical history, family history and social history were all reviewed and documented in the EPIC chart.  Gynecologic History No LMP recorded. Patient is not currently having periods (Reason: Perimenopausal). Contraception: vasectomy Last Pap: 2012. Results were: normal Last mammogram: 2014. Results were: see above  Obstetric History OB History  Gravida Para Term Preterm AB SAB TAB Ectopic Multiple Living  3 2   1 1    2     # Outcome Date GA Lbr Len/2nd Weight Sex Delivery Anes PTL Lv  3 SAB           2 PAR           1 PAR                ROS: A ROS was performed and pertinent positives and negatives are included in the history.  GENERAL: No fevers or chills. HEENT: No change in vision, no earache, sore throat or sinus congestion. NECK: No pain or stiffness. CARDIOVASCULAR: No chest pain or pressure. No palpitations. PULMONARY:  No shortness of breath, cough or wheeze. GASTROINTESTINAL: No abdominal pain, nausea, vomiting or diarrhea, melena or bright red blood per rectum. GENITOURINARY: No urinary frequency, urgency, hesitancy or dysuria. MUSCULOSKELETAL: No joint or muscle pain, no back pain, no recent trauma. DERMATOLOGIC: No rash, no itching, no lesions. ENDOCRINE: No polyuria, polydipsia, no heat or cold intolerance. No recent change in weight. HEMATOLOGICAL: No anemia or easy bruising or bleeding. NEUROLOGIC: No headache, seizures, numbness, tingling or weakness. PSYCHIATRIC: No depression, no loss of interest in normal activity or change in sleep pattern.     Exam: chaperone present  BP 130/80  Ht 5' 2.25" (1.581 m)  Wt 172 lb (78.019 kg)  BMI 31.21 kg/m2  Body mass index is 31.21 kg/(m^2).  General appearance : Well developed well nourished female. No acute distress HEENT: Neck supple, trachea midline, no carotid bruits, no thyroidmegaly Lungs: Clear to auscultation, no rhonchi or wheezes, or rib retractions  Heart: Regular rate and rhythm, no murmurs or gallops Breast:Examined in sitting and supine position were symmetrical in appearance, no palpable masses or tenderness,  no skin retraction, no nipple inversion, no nipple discharge, no skin discoloration, no axillary or supraclavicular lymphadenopathy Abdomen: no palpable masses or tenderness, no rebound or guarding Extremities: no edema or skin discoloration or tenderness  Pelvic:  Bartholin, Urethra, Skene Glands: Within normal limits             Vagina: No gross lesions or  discharge  Cervix: No gross lesions or discharge  Uterus  anteverted, normal size, shape and consistency, non-tender and mobile  Adnexa  Without masses or tenderness  Anus and perineum  normal   Rectovaginal  normal sphincter tone without palpated masses or tenderness             Hemoccult course provided     Assessment/Plan:  47 y.o. female for annual exam was strong family  history of colon polyps. Her twin sister as well as her mother were diagnosed with colon polyps. We'll recommend the patient follow up with gastroenterologist for an early colonoscopy screening. Patient also with strong family history of diabetes we'll do a hemoglobin A1c today since she is nonfasting. Additional labs will be as follows: Screen cholesterol, TSH, CBC, urinalysis as well as FSH and prolactin. Patient's twin sister recently went into menopause. No Pap smear done today the new guidelines were discussed. Patient was reminded to smoke to the office the nuchal cords for testing. She was monitored and her monthly breast exam. We discussed importance of regular exercise and calcium and vitamin D for osteoporosis prevention. Literature information on the perimenopause as well as hormone replacement therapy was provided. We'll await further results and manage accordingly. I have informed the patient involved the above tests are normal she would need to set up an appointment to see me for further testing for her amenorrhea.    Ok Edwards MD, 11:47 AM 09/18/2012

## 2012-09-18 NOTE — Patient Instructions (Addendum)
Tetanus, Diphtheria, Pertussis (Tdap) Vaccine What You Need to Know WHY GET VACCINATED? Tetanus, diphtheria and pertussis can be very serious diseases, even for adolescents and adults. Tdap vaccine can protect us from these diseases. TETANUS (Lockjaw) causes painful muscle tightening and stiffness, usually all over the body.  It can lead to tightening of muscles in the head and neck so you can't open your mouth, swallow, or sometimes even breathe. Tetanus kills about 1 out of 5 people who are infected. DIPHTHERIA can cause a thick coating to form in the back of the throat.  It can lead to breathing problems, paralysis, heart failure, and death. PERTUSSIS (Whooping Cough) causes severe coughing spells, which can cause difficulty breathing, vomiting and disturbed sleep.  It can also lead to weight loss, incontinence, and rib fractures. Up to 2 in 100 adolescents and 5 in 100 adults with pertussis are hospitalized or have complications, which could include pneumonia and death. These diseases are caused by bacteria. Diphtheria and pertussis are spread from person to person through coughing or sneezing. Tetanus enters the body through cuts, scratches, or wounds. Before vaccines, the [place] saw as many as 200,000 cases a year of diphtheria and pertussis, and hundreds of cases of tetanus. Since vaccination began, tetanus and diphtheria have dropped by about 99% and pertussis by about 80%. TDAP VACCINE Tdap vaccine can protect adolescents and adults from tetanus, diphtheria, and pertussis. One dose of Tdap is routinely given at age 11 or 12. People who did not get Tdap at that age should get it as soon as possible. Tdap is especially important for health care professionals and anyone having close contact with a baby younger than 12 months. Pregnant women should get a dose of Tdap during every pregnancy, to protect the newborn from pertussis. Infants are most at risk for severe, life-threatening  complications from pertussis. A similar vaccine, called Td, protects from tetanus and diphtheria, but not pertussis. A Td booster should be given every 10 years. Tdap may be given as one of these boosters if you have not already gotten a dose. Tdap may also be given after a severe cut or burn to prevent tetanus infection. Your doctor can give you more information. Tdap may safely be given at the same time as other vaccines. SOME PEOPLE SHOULD NOT GET THIS VACCINE  If you ever had a life-threatening allergic reaction after a dose of any tetanus, diphtheria, or pertussis containing vaccine, OR if you have a severe allergy to any part of this vaccine, you should not get Tdap. Tell your doctor if you have any severe allergies.  If you had a coma, or long or multiple seizures within 7 days after a childhood dose of DTP or DTaP, you should not get Tdap, unless a cause other than the vaccine was found. You can still get Td.  Talk to your doctor if you:  have epilepsy or another nervous system problem,  had severe pain or swelling after any vaccine containing diphtheria, tetanus or pertussis,  ever had Guillain-Barr Syndrome (GBS),  aren't feeling well on the day the shot is scheduled. RISKS OF A VACCINE REACTION With any medicine, including vaccines, there is a chance of side effects. These are usually mild and go away on their own, but serious reactions are also possible. Brief fainting spells can follow a vaccination, leading to injuries from falling. Sitting or lying down for about 15 minutes can help prevent these. Tell your doctor if you feel dizzy or light-headed, or   have vision changes or ringing in the ears. Mild problems following Tdap (Did not interfere with activities)  Pain where the shot was given (about 3 in 4 adolescents or 2 in 3 adults)  Redness or swelling where the shot was given (about 1 person in 5)  Mild fever of at least 100.35F (up to about 1 in 25 adolescents or 1 in  100 adults)  Headache (about 3 or 4 people in 10)  Tiredness (about 1 person in 3 or 4)  Nausea, vomiting, diarrhea, stomach ache (up to 1 in 4 adolescents or 1 in 10 adults)  Chills, body aches, sore joints, rash, swollen glands (uncommon) Moderate problems following Tdap (Interfered with activities, but did not require medical attention)  Pain where the shot was given (about 1 in 5 adolescents or 1 in 100 adults)  Redness or swelling where the shot was given (up to about 1 in 16 adolescents or 1 in 25 adults)  Fever over 102F (about 1 in 100 adolescents or 1 in 250 adults)  Headache (about 3 in 20 adolescents or 1 in 10 adults)  Nausea, vomiting, diarrhea, stomach ache (up to 1 or 3 people in 100)  Swelling of the entire arm where the shot was given (up to about 3 in 100). Severe problems following Tdap (Unable to perform usual activities, required medical attention)  Swelling, severe pain, bleeding and redness in the arm where the shot was given (rare). A severe allergic reaction could occur after any vaccine (estimated less than 1 in a million doses). WHAT IF THERE IS A SERIOUS REACTION? What should I look for?  Look for anything that concerns you, such as signs of a severe allergic reaction, very high fever, or behavior changes. Signs of a severe allergic reaction can include hives, swelling of the face and throat, difficulty breathing, a fast heartbeat, dizziness, and weakness. These would start a few minutes to a few hours after the vaccination. What should I do?  If you think it is a severe allergic reaction or other emergency that can't wait, call 9-1-1 or get the person to the nearest hospital. Otherwise, call your doctor.  Afterward, the reaction should be reported to the "Vaccine Adverse Event Reporting System" (VAERS). Your doctor might file this report, or you can do it yourself through the VAERS web site at www.vaers.LAgents.no, or by calling 1-701 797 1135. VAERS is  only for reporting reactions. They do not give medical advice.  THE NATIONAL VACCINE INJURY COMPENSATION PROGRAM The National Vaccine Injury Compensation Program (VICP) is a federal program that was created to compensate people who may have been injured by certain vaccines. Persons who believe they may have been injured by a vaccine can learn about the program and about filing a claim by calling 1-671-098-8757 or visiting the VICP website at SpiritualWord.at. HOW CAN I LEARN MORE?  Ask your doctor.  Call your local or state health department.  Contact the Centers for Disease Control and Prevention (CDC):  Call 913-476-3139 or visit CDC's website at PicCapture.uy. CDC Tdap Vaccine VIS (06/06/11) Document Released: 07/16/2011 Document Revised: 10/09/2011 Document Reviewed: 07/16/2011 [employer] Patient Information 2014 [place], [place]. Perimenopause Perimenopause is the time when your body begins to move into the menopause (no menstrual period for 12 straight months). It is a natural process. Perimenopause can begin 2 to 8 years before the menopause and usually lasts for one year after the menopause. During this time, your ovaries may or may not produce an egg. The ovaries vary in  their production of estrogen and progesterone hormones each month. This can cause irregular menstrual periods, difficulty in getting pregnant, vaginal bleeding between periods and uncomfortable symptoms. CAUSES  Irregular production of the ovarian hormones, estrogen and progesterone, and not ovulating every month.  Other causes include:  Tumor of the pituitary gland in the brain.  Medical disease that affects the ovaries.  Radiation treatment.  Chemotherapy.  Unknown causes.  Heavy smoking and excessive alcohol intake can bring on perimenopause sooner. SYMPTOMS   Hot flashes.  Night sweats.  Irregular menstrual periods.  Decrease sex drive.  Vaginal  dryness.  Headaches.  Mood swings.  Depression.  Memory problems.  Irritability.  Tiredness.  Weight gain.  Trouble getting pregnant.  The beginning of losing bone cells (osteoporosis).  The beginning of hardening of the arteries (atherosclerosis). DIAGNOSIS  Your caregiver will make a diagnosis by analyzing your age, menstrual history and your symptoms. They will do a physical exam noting any changes in your body, especially your female organs. Female hormone tests may or may not be helpful depending on the amount and when you produce the female hormones. However, other hormone tests may be helpful (ex. thyroid hormone) to rule out other problems. TREATMENT  The decision to treat during the perimenopause should be made by you and your caregiver depending on how the symptoms are affecting you and your life style. There are various treatments available such as:  Treating individual symptoms with a specific medication for that symptom (ex. tranquilizer for depression).  Herbal medications that can help specific symptoms.  Counseling.  Group therapy.  No treatment. HOME CARE INSTRUCTIONS   Before seeing your caregiver, make a list of your menstrual periods (when the occur, how heavy they are, how long between periods and how long they last), your symptoms and when they started.  Take the medication as recommended by your caregiver.  Sleep and rest.  Exercise.  Eat a diet that contains calcium (good for your bones) and soy (acts like estrogen hormone).  Do not smoke.  Avoid alcoholic beverages.  Taking vitamin E may help in certain cases.  Take calcium and vitamin D supplements to help prevent bone loss.  Group therapy is sometimes helpful.  Acupuncture may help in some cases. SEEK MEDICAL CARE IF:   You have any of the above and want to know if it is perimenopause.  You want advice and treatment for any of your symptoms mentioned above.  You need a  referral to a specialist (gynecologist, psychiatrist or psychologist). SEEK IMMEDIATE MEDICAL CARE IF:   You have vaginal bleeding.  Your period lasts longer than 8 days.  You periods are recurring sooner than 21 days.  You have bleeding after intercourse.  You have severe depression.  You have pain when you urinate.  You have severe headaches.  You develop vision problems. Document Released: 02/22/2004 Document Revised: 04/08/2011 Document Reviewed: 11/12/2007 [facility] Patient Information 2014 [place], [place]. Colonoscopy A colonoscopy is an exam to evaluate your entire colon. In this exam, your colon is cleansed. A long fiberoptic tube is inserted through your rectum and into your colon. The fiberoptic scope (endoscope) is a long bundle of enclosed and very flexible fibers. These fibers transmit light to the area examined and send images from that area to your caregiver. Discomfort is usually minimal. You may be given a drug to help you sleep (sedative) during or prior to the procedure. This exam helps to detect lumps (tumors), polyps, inflammation, and areas of bleeding.  Your caregiver may also take a small piece of tissue (biopsy) that will be examined under a microscope. LET YOUR CAREGIVER KNOW ABOUT:   Allergies to food or medicine.  Medicines taken, including vitamins, herbs, eyedrops, over-the-counter medicines, and creams.  Use of steroids (by mouth or creams).  Previous problems with anesthetics or numbing medicines.  History of bleeding problems or blood clots.  Previous surgery.  Other health problems, including diabetes and kidney problems.  Possibility of pregnancy, if this applies. BEFORE THE PROCEDURE   A clear liquid diet may be required for 2 days before the exam.  Ask your caregiver about changing or stopping your regular medications.  Liquid injections (enemas) or laxatives may be required.  A large amount of electrolyte solution may be given to you  to drink over a short period of time. This solution is used to clean out your colon.  You should be present 60 minutes prior to your procedure or as directed by your caregiver. AFTER THE PROCEDURE   If you received a sedative or pain relieving medication, you will need to arrange for someone to drive you home.  Occasionally, there is a little blood passed with the first bowel movement. Do not be concerned. FINDING OUT THE RESULTS OF YOUR TEST Not all test results are available during your visit. If your test results are not back during the visit, make an appointment with your caregiver to find out the results. Do not assume everything is normal if you have not heard from your caregiver or the medical facility. It is important for you to follow up on all of your test results. HOME CARE INSTRUCTIONS   It is not unusual to pass moderate amounts of gas and experience mild abdominal cramping following the procedure. This is due to air being used to inflate your colon during the exam. Walking or a warm pack on your belly (abdomen) may help.  You may resume all normal meals and activities after sedatives and medicines have worn off.  Only take over-the-counter or prescription medicines for pain, discomfort, or fever as directed by your caregiver. Do not use aspirin or blood thinners if a biopsy was taken. Consult your caregiver for medicine usage if biopsies were taken. SEEK IMMEDIATE MEDICAL CARE IF:   You have a fever.  You pass large blood clots or fill a toilet with blood following the procedure. This may also occur 10 to 14 days following the procedure. This is more likely if a biopsy was taken.  You develop abdominal pain that keeps getting worse and cannot be relieved with medicine. Document Released: 01/12/2000 Document Revised: 04/08/2011 Document Reviewed: 08/27/2007 [facility] Patient Information 2014 [place], [place].  Hormone Therapy At menopause, your body begins making less estrogen  and progesterone hormones. This causes the body to stop having menstrual periods. This is because estrogen and progesterone hormones control your periods and menstrual cycle. A lack of estrogen may cause symptoms such as:  Hot flushes (or hot flashes).  Vaginal dryness.  Dry skin.  Loss of sex drive.  Risk of bone loss (osteoporosis). When this happens, you may choose to take hormone therapy to get back the estrogen lost during menopause. When the hormone estrogen is given alone, it is usually referred to as ET (Estrogen Therapy). When the hormone progestin is combined with estrogen, it is generally called HT (Hormone Therapy). This was formerly known as hormone replacement therapy (HRT). Your caregiver can help you make a decision on what will be best for  you. The decision to use HT seems to change often as new studies are done. Many studies do not agree on the benefits of hormone replacement therapy. LIKELY BENEFITS OF HT INCLUDE PROTECTION FROM:  Hot Flushes (also called hot flashes) - A hot flush is a sudden feeling of heat that spreads over the face and body. The skin may redden like a blush. It is connected with sweats and sleep disturbance. Women going through menopause may have hot flushes a few times a month or several times per day depending on the woman.  Osteoporosis (bone loss)- Estrogen helps guard against bone loss. After menopause, a woman's bones slowly lose calcium and become weak and brittle. As a result, bones are more likely to break. The hip, wrist, and spine are affected most often. Hormone therapy can help slow bone loss after menopause. Weight bearing exercise and taking calcium with vitamin D also can help prevent bone loss. There are also medications that your caregiver can prescribe that can help prevent osteoporosis.  Vaginal Dryness - Loss of estrogen causes changes in the vagina. Its lining may become thin and dry. These changes can cause pain and bleeding during  sexual intercourse. Dryness can also lead to infections. This can cause burning and itching. (Vaginal estrogen treatment can help relieve pain, itching, and dryness.)  Urinary Tract Infections are more common after menopause because of lack of estrogen. Some women also develop urinary incontinence because of low estrogen levels in the vagina and bladder.  Possible other benefits of estrogen include a positive effect on mood and short-term memory in women. RISKS AND COMPLICATIONS  Using estrogen alone without progesterone causes the lining of the uterus to grow. This increases the risk of lining of the uterus (endometrial) cancer. Your caregiver should give another hormone called progestin if you have a uterus.  Women who take combined (estrogen and progestin) HT appear to have an increased risk of breast cancer. The risk appears to be small, but increases throughout the time that HT is taken.  Combined therapy also makes the breast tissue slightly denser which makes it harder to read mammograms (breast X-rays).  Combined, estrogen and progesterone therapy can be taken together every day, in which case there may be spotting of blood. HT therapy can be taken cyclically in which case you will have menstrual periods. Cyclically means HT is taken for a set amount of days, then not taken, then this process is repeated.  HT may increase the risk of stroke, heart attack, breast cancer and forming blood clots in your leg.  Transdermal estrogen (estrogen that is absorbed through the skin with a patch or a cream) may have more positive results with:  Cholesterol.  Blood pressure.  Blood clots. Having the following conditions may indicate you should not have HT:  Endometrial cancer.  Liver disease.  Breast cancer.  Heart disease.  History of blood clots.  Stroke. TREATMENT   If you choose to take HT and have a uterus, usually estrogen and progestin are prescribed.  Your caregiver will  help you decide the best way to take the medications.  Possible ways to take estrogen include:  Pills.  Patches.  Gels.  Sprays.  Vaginal estrogen cream, rings and tablets.  It is best to take the lowest dose possible that will help your symptoms and take them for the shortest period of time that you can.  Hormone therapy can help relieve some of the problems (symptoms) that affect women at menopause. Before making  a decision about HT, talk to your caregiver about what is best for you. Be well informed and comfortable with your decisions. HOME CARE INSTRUCTIONS   Follow your caregivers advice when taking the medications.  A Pap test is done to screen for cervical cancer.  The first Pap test should be done at age 65.  Between ages 2 and 26, Pap tests are repeated every 2 years.  Beginning at age 52, you are advised to have a Pap test every 3 years as long as your past 3 Pap tests have been normal.  Some women have medical problems that increase the chance of getting cervical cancer. Talk to your caregiver about these problems. It is especially important to talk to your caregiver if a new problem develops soon after your last Pap test. In these cases, your caregiver may recommend more frequent screening and Pap tests.  The above recommendations are the same for women who have or have not gotten the vaccine for HPV (Human Papillomavirus).  If you had a hysterectomy for a problem that was not a cancer or a condition that could lead to cancer, then you no longer need Pap tests. However, even if you no longer need a Pap test, a regular exam is a good idea to make sure no other problems are starting.   If you are between ages 48 and 35, and you have had normal Pap tests going back 10 years, you no longer need Pap tests. However, even if you no longer need a Pap test, a regular exam is a good idea to make sure no other problems are starting.   If you have had past treatment for  cervical cancer or a condition that could lead to cancer, you need Pap tests and screening for cancer for at least 20 years after your treatment.  If Pap tests have been discontinued, risk factors (such as a new sexual partner) need to be re-assessed to determine if screening should be resumed.  Some women may need screenings more often if they are at high risk for cervical cancer.  Get mammograms done as per the advice of your caregiver. SEEK IMMEDIATE MEDICAL CARE IF:  You develop abnormal vaginal bleeding.  You have pain or swelling in your legs, shortness of breath, or chest pain.  You develop dizziness or headaches.  You have lumps or changes in your breasts or armpits.  You have slurred speech.  You develop weakness or numbness of your arms or legs.  You have pain, burning, or bleeding when urinating.  You develop abdominal pain. Document Released: 10/13/2002 Document Revised: 04/08/2011 Document Reviewed: 01/31/2010 [facility] Patient Information 2014 [place], [place].

## 2012-09-19 LAB — URINALYSIS W MICROSCOPIC + REFLEX CULTURE
Bacteria, UA: NONE SEEN
Bilirubin Urine: NEGATIVE
Glucose, UA: NEGATIVE mg/dL
Hgb urine dipstick: NEGATIVE
Ketones, ur: NEGATIVE mg/dL
Protein, ur: NEGATIVE mg/dL

## 2012-09-21 ENCOUNTER — Other Ambulatory Visit: Payer: Self-pay | Admitting: Gynecology

## 2012-09-21 DIAGNOSIS — E7800 Pure hypercholesterolemia, unspecified: Secondary | ICD-10-CM

## 2012-11-23 ENCOUNTER — Other Ambulatory Visit: Payer: BC Managed Care – PPO

## 2012-11-23 DIAGNOSIS — E7800 Pure hypercholesterolemia, unspecified: Secondary | ICD-10-CM

## 2012-11-23 LAB — LIPID PANEL
Cholesterol: 183 mg/dL (ref 0–200)
Triglycerides: 122 mg/dL (ref <150)

## 2013-03-11 ENCOUNTER — Other Ambulatory Visit: Payer: Self-pay | Admitting: Gynecology

## 2013-03-11 DIAGNOSIS — Z1231 Encounter for screening mammogram for malignant neoplasm of breast: Secondary | ICD-10-CM

## 2013-03-30 ENCOUNTER — Ambulatory Visit (HOSPITAL_COMMUNITY)
Admission: RE | Admit: 2013-03-30 | Discharge: 2013-03-30 | Disposition: A | Discharge status: Home / Self Care | Payer: BC Managed Care – PPO | Source: Ambulatory Visit | Attending: Gynecology | Admitting: Gynecology

## 2013-03-30 DIAGNOSIS — Z1231 Encounter for screening mammogram for malignant neoplasm of breast: Secondary | ICD-10-CM | POA: Insufficient documentation

## 2013-09-20 ENCOUNTER — Encounter: Payer: BC Managed Care – PPO | Admitting: Gynecology

## 2013-09-29 ENCOUNTER — Encounter: Payer: BC Managed Care – PPO | Admitting: Gynecology

## 2013-10-05 ENCOUNTER — Encounter: Payer: Self-pay | Admitting: Women's Health

## 2013-10-05 ENCOUNTER — Other Ambulatory Visit (HOSPITAL_COMMUNITY)
Admission: RE | Admit: 2013-10-05 | Discharge: 2013-10-05 | Disposition: A | Discharge status: Home / Self Care | Payer: BC Managed Care – PPO | Source: Ambulatory Visit | Attending: Women's Health | Admitting: Women's Health

## 2013-10-05 ENCOUNTER — Ambulatory Visit (INDEPENDENT_AMBULATORY_CARE_PROVIDER_SITE_OTHER): Payer: BC Managed Care – PPO | Admitting: Women's Health

## 2013-10-05 ENCOUNTER — Encounter: Payer: BC Managed Care – PPO | Admitting: Gynecology

## 2013-10-05 VITALS — BP 125/80 | Ht 62.25 in | Wt 174.4 lb

## 2013-10-05 DIAGNOSIS — Z01419 Encounter for gynecological examination (general) (routine) without abnormal findings: Secondary | ICD-10-CM | POA: Diagnosis present

## 2013-10-05 DIAGNOSIS — Z1151 Encounter for screening for human papillomavirus (HPV): Secondary | ICD-10-CM | POA: Insufficient documentation

## 2013-10-05 DIAGNOSIS — E079 Disorder of thyroid, unspecified: Secondary | ICD-10-CM

## 2013-10-05 LAB — TSH: TSH: 2.809 u[IU]/mL (ref 0.350–4.500)

## 2013-10-05 LAB — CBC WITH DIFFERENTIAL/PLATELET
BASOS ABS: 0 10*3/uL (ref 0.0–0.1)
Basophils Relative: 0 % (ref 0–1)
EOS PCT: 3 % (ref 0–5)
Eosinophils Absolute: 0.2 10*3/uL (ref 0.0–0.7)
HCT: 39 % (ref 36.0–46.0)
Hemoglobin: 13.2 g/dL (ref 12.0–15.0)
LYMPHS PCT: 27 % (ref 12–46)
Lymphs Abs: 2.1 10*3/uL (ref 0.7–4.0)
MCH: 29.8 pg (ref 26.0–34.0)
MCHC: 33.8 g/dL (ref 30.0–36.0)
MCV: 88 fL (ref 78.0–100.0)
Monocytes Absolute: 0.5 10*3/uL (ref 0.1–1.0)
Monocytes Relative: 7 % (ref 3–12)
NEUTROS ABS: 4.8 10*3/uL (ref 1.7–7.7)
NEUTROS PCT: 63 % (ref 43–77)
PLATELETS: 220 10*3/uL (ref 150–400)
RBC: 4.43 MIL/uL (ref 3.87–5.11)
RDW: 13.7 % (ref 11.5–15.5)
WBC: 7.6 10*3/uL (ref 4.0–10.5)

## 2013-10-05 NOTE — Patient Instructions (Signed)
Health Recommendations for Postmenopausal Women Respected and ongoing research has looked at the most common causes of death, disability, and poor quality of life in postmenopausal women. The causes include heart disease, diseases of blood vessels, diabetes, depression, cancer, and bone loss (osteoporosis). Many things can be done to help lower the chances of developing these and other common problems. CARDIOVASCULAR DISEASE Heart Disease: A heart attack is a medical emergency. Know the signs and symptoms of a heart attack. Below are things women can do to reduce their risk for heart disease.   Do not smoke. If you smoke, quit.  Aim for a healthy weight. Being overweight causes many preventable deaths. Eat a healthy and balanced diet and drink an adequate amount of liquids.  Get moving. Make a commitment to be more physically active. Aim for 30 minutes of activity on most, if not all days of the week.  Eat for heart health. Choose a diet that is low in saturated fat and cholesterol and eliminate trans fat. Include whole grains, vegetables, and fruits. Read and understand the labels on food containers before buying.  Know your numbers. Ask your caregiver to check your blood pressure, cholesterol (total, HDL, LDL, triglycerides) and blood glucose. Work with your caregiver on improving your entire clinical picture.  High blood pressure. Limit or stop your table salt intake (try salt substitute and food seasonings). Avoid salty foods and drinks. Read labels on food containers before buying. Eating well and exercising can help control high blood pressure. STROKE  Stroke is a medical emergency. Stroke may be the result of a blood clot in a blood vessel in the brain or by a brain hemorrhage (bleeding). Know the signs and symptoms of a stroke. To lower the risk of developing a stroke:  Avoid fatty foods.  Quit smoking.  Control your diabetes, blood pressure, and irregular heart rate. THROMBOPHLEBITIS  (BLOOD CLOT) OF THE LEG  Becoming overweight and leading a stationary lifestyle may also contribute to developing blood clots. Controlling your diet and exercising will help lower the risk of developing blood clots. CANCER SCREENING  Breast Cancer: Take steps to reduce your risk of breast cancer.  You should practice "breast self-awareness." This means understanding the normal appearance and feel of your breasts and should include breast self-examination. Any changes detected, no matter how small, should be reported to your caregiver.  After age 40, you should have a clinical breast exam (CBE) every year.  Starting at age 40, you should consider having a mammogram (breast X-ray) every year.  If you have a family history of breast cancer, talk to your caregiver about genetic screening.  If you are at high risk for breast cancer, talk to your caregiver about having an MRI and a mammogram every year.  Intestinal or Stomach Cancer: Tests to consider are a rectal exam, fecal occult blood, sigmoidoscopy, and colonoscopy. Women who are high risk may need to be screened at an earlier age and more often.  Cervical Cancer:  Beginning at age 30, you should have a Pap test every 3 years as long as the past 3 Pap tests have been normal.  If you have had past treatment for cervical cancer or a condition that could lead to cancer, you need Pap tests and screening for cancer for at least 20 years after your treatment.  If you had a hysterectomy for a problem that was not cancer or a condition that could lead to cancer, then you no longer need Pap tests.    If you are between ages 65 and 70, and you have had normal Pap tests going back 10 years, you no longer need Pap tests.  If Pap tests have been discontinued, risk factors (such as a new sexual partner) need to be reassessed to determine if screening should be resumed.  Some medical problems can increase the chance of getting cervical cancer. In these  cases, your caregiver may recommend more frequent screening and Pap tests.  Uterine Cancer: If you have vaginal bleeding after reaching menopause, you should notify your caregiver.  Ovarian Cancer: Other than yearly pelvic exams, there are no reliable tests available to screen for ovarian cancer at this time except for yearly pelvic exams.  Lung Cancer: Yearly chest X-rays can detect lung cancer and should be done on high risk women, such as cigarette smokers and women with chronic lung disease (emphysema).  Skin Cancer: A complete body skin exam should be done at your yearly examination. Avoid overexposure to the sun and ultraviolet light lamps. Use a strong sun block cream when in the sun. All of these things are important for lowering the risk of skin cancer. MENOPAUSE Menopause Symptoms: Hormone therapy products are effective for treating symptoms associated with menopause:  Moderate to severe hot flashes.  Night sweats.  Mood swings.  Headaches.  Tiredness.  Loss of sex drive.  Insomnia.  Other symptoms. Hormone replacement carries certain risks, especially in older women. Women who use or are thinking about using estrogen or estrogen with progestin treatments should discuss that with their caregiver. Your caregiver will help you understand the benefits and risks. The ideal dose of hormone replacement therapy is not known. The Food and Drug Administration (FDA) has concluded that hormone therapy should be used only at the lowest doses and for the shortest amount of time to reach treatment goals.  OSTEOPOROSIS Protecting Against Bone Loss and Preventing Fracture If you use hormone therapy for prevention of bone loss (osteoporosis), the risks for bone loss must outweigh the risk of the therapy. Ask your caregiver about other medications known to be safe and effective for preventing bone loss and fractures. To guard against bone loss or fractures, the following is recommended:  If  you are younger than age 50, take 1000 mg of calcium and at least 600 mg of Vitamin D per day.  If you are older than age 50 but younger than age 70, take 1200 mg of calcium and at least 600 mg of Vitamin D per day.  If you are older than age 70, take 1200 mg of calcium and at least 800 mg of Vitamin D per day. Smoking and excessive alcohol intake increases the risk of osteoporosis. Eat foods rich in calcium and vitamin D and do weight bearing exercises several times a week as your caregiver suggests. DIABETES Diabetes Mellitus: If you have type I or type 2 diabetes, you should keep your blood sugar under control with diet, exercise, and recommended medication. Avoid starchy and fatty foods, and too many sweets. Being overweight can make diabetes control more difficult. COGNITION AND MEMORY Cognition and Memory: Menopausal hormone therapy is not recommended for the prevention of cognitive disorders such as Alzheimer's disease or memory loss.  DEPRESSION  Depression may occur at any age, but it is common in elderly women. This may be because of physical, medical, social (loneliness), or financial problems and needs. If you are experiencing depression because of medical problems and control of symptoms, talk to your caregiver about this. Physical   activity and exercise may help with mood and sleep. Community and volunteer involvement may improve your sense of value and worth. If you have depression and you feel that the problem is getting worse or becoming severe, talk to your caregiver about which treatment options are best for you. ACCIDENTS  Accidents are common and can be serious in elderly woman. Prepare your house to prevent accidents. Eliminate throw rugs, place hand bars in bath, shower, and toilet areas. Avoid wearing high heeled shoes or walking on wet, snowy, and icy areas. Limit or stop driving if you have vision or hearing problems, or if you feel you are unsteady with your movements and  reflexes. HEPATITIS C Hepatitis C is a type of viral infection affecting the liver. It is spread mainly through contact with blood from an infected person. It can be treated, but if left untreated, it can lead to severe liver damage over the years. Many people who are infected do not know that the virus is in their blood. If you are a "baby-boomer", it is recommended that you have one screening test for Hepatitis C. IMMUNIZATIONS  Several immunizations are important to consider having during your senior years, including:   Tetanus, diphtheria, and pertussis booster shot.  Influenza every year before the flu season begins.  Pneumonia vaccine.  Shingles vaccine.  Others, as indicated based on your specific needs. Talk to your caregiver about these. Document Released: 03/08/2005 Document Revised: 05/31/2013 Document Reviewed: 11/02/2007 [employer] Patient Information 2015 [employer]. This information is not intended to replace advice given to you by your health care provider. Make sure you discuss any questions you have with your health care provider.  

## 2013-10-05 NOTE — Progress Notes (Signed)
Miranda Hill [birth year] [id number]    History:    Presents for annual exam.  Postmenopausal/no bleeding no HRT. 2013 amenorrheic, Mirena IUD removed 2014, FSH 100 2014. Normal Pap and mammogram history. Twin sister and mother had benign colon polyps, insurance will not cover colonoscopy until age 49. Lipid panel 202, HDL 56, triglycerides 154, LDL 105, glucose 88 at a health screening for work. History of GDM.  Past medical history, past surgical history, family history and social history were all reviewed and documented in the EPIC chart. Preschool teacher, Tree surgeon. 2 sons 39 and 68 both doing well.  ROS:  A  12 point ROS was performed and pertinent positives and negatives are included.  Exam:  Filed Vitals:   10/05/13 1403  BP: 125/80    General appearance:  Normal Thyroid:  Symmetrical, normal in size, without palpable masses or nodularity. Respiratory  Auscultation:  Clear without wheezing or rhonchi Cardiovascular  Auscultation:  Regular rate, without rubs, murmurs or gallops  Edema/varicosities:  Not grossly evident Abdominal  Soft,nontender, without masses, guarding or rebound.  Liver/spleen:  No organomegaly noted  Hernia:  None appreciated  Skin  Inspection:  Grossly normal   Breasts: Examined lying and sitting.     Right: Without masses, retractions, discharge or axillary adenopathy.     Left: Without masses, retractions, discharge or axillary adenopathy. Gentitourinary   Inguinal/mons:  Normal without inguinal adenopathy  External genitalia:  Normal  BUS/Urethra/Skene's glands:  Normal  Vagina:  Normal  Cervix:  Nabothian cyst at 12:00 position on cervix at OS  Uterus:   normal in size, shape and contour.  Midline and mobile  Adnexa/parametria:     Rt: Without masses or tenderness.   Lt: Without masses or tenderness.  Anus and perineum: Normal  Digital rectal exam: Normal sphincter tone without palpated masses or tenderness  Assessment/Plan:  48 y.o. MWF  G3P2 for annual exam with complaint of weight gain despite exercise.  Postmenopausal/no bleeding/no HRT Stress incontinence/status post sling  Plan: TSH, CBC, UA, Pap with HR HPV typing. Pap normal 2012. New screening guidelines reviewed. SBE's, continue annual screening mammogram, calcium rich diet, vitamin D 2000 daily, regular exercise and working with a Psychologist, educational. Encouraged Weight Watchers for weight loss. Encouraged counseling for marital issues, no infidelity.   Note: This dictation was prepared with Dragon/digital dictation.  Any transcriptional errors that result are unintentional. Harrington Challenger [facility], 2:43 PM 10/05/2013

## 2013-10-06 LAB — URINALYSIS W MICROSCOPIC + REFLEX CULTURE
BACTERIA UA: NONE SEEN
BILIRUBIN URINE: NEGATIVE
Casts: NONE SEEN
Crystals: NONE SEEN
Glucose, UA: NEGATIVE mg/dL
Hgb urine dipstick: NEGATIVE
Ketones, ur: NEGATIVE mg/dL
Leukocytes, UA: NEGATIVE
Nitrite: NEGATIVE
PROTEIN: NEGATIVE mg/dL
Specific Gravity, Urine: 1.028 (ref 1.005–1.030)
UROBILINOGEN UA: 0.2 mg/dL (ref 0.0–1.0)
pH: 6 (ref 5.0–8.0)

## 2013-10-08 LAB — CYTOLOGY - PAP

## 2013-10-14 ENCOUNTER — Telehealth: Payer: Self-pay | Admitting: *Deleted

## 2013-10-14 NOTE — Telephone Encounter (Signed)
Pt called requesting TSH level results, I left on her voicemail normal TSH and to call if questions.

## 2013-10-21 ENCOUNTER — Other Ambulatory Visit: Payer: Self-pay | Admitting: Family Medicine

## 2013-10-21 DIAGNOSIS — E041 Nontoxic single thyroid nodule: Secondary | ICD-10-CM

## 2013-10-27 ENCOUNTER — Ambulatory Visit
Admission: RE | Admit: 2013-10-27 | Discharge: 2013-10-27 | Disposition: A | Discharge status: Home / Self Care | Payer: BC Managed Care – PPO | Source: Ambulatory Visit | Attending: Family Medicine | Admitting: Family Medicine

## 2013-10-27 DIAGNOSIS — E041 Nontoxic single thyroid nodule: Secondary | ICD-10-CM

## 2013-11-29 ENCOUNTER — Encounter: Payer: Self-pay | Admitting: Women's Health

## 2014-01-19 DIAGNOSIS — E042 Nontoxic multinodular goiter: Secondary | ICD-10-CM | POA: Insufficient documentation

## 2014-03-07 ENCOUNTER — Other Ambulatory Visit: Payer: Self-pay | Admitting: Gynecology

## 2014-03-07 DIAGNOSIS — Z1231 Encounter for screening mammogram for malignant neoplasm of breast: Secondary | ICD-10-CM

## 2014-04-13 ENCOUNTER — Ambulatory Visit (HOSPITAL_COMMUNITY)
Admission: RE | Admit: 2014-04-13 | Discharge: 2014-04-13 | Disposition: A | Discharge status: Home / Self Care | Payer: BLUE CROSS/BLUE SHIELD | Source: Ambulatory Visit | Attending: Gynecology | Admitting: Gynecology

## 2014-04-13 DIAGNOSIS — Z1231 Encounter for screening mammogram for malignant neoplasm of breast: Secondary | ICD-10-CM | POA: Diagnosis present

## 2014-12-06 ENCOUNTER — Encounter: Payer: Self-pay | Admitting: Women's Health

## 2014-12-09 ENCOUNTER — Ambulatory Visit (INDEPENDENT_AMBULATORY_CARE_PROVIDER_SITE_OTHER): Payer: BLUE CROSS/BLUE SHIELD | Admitting: Women's Health

## 2014-12-09 ENCOUNTER — Encounter: Payer: Self-pay | Admitting: Women's Health

## 2014-12-09 ENCOUNTER — Other Ambulatory Visit (HOSPITAL_COMMUNITY)
Admission: RE | Admit: 2014-12-09 | Discharge: 2014-12-09 | Disposition: A | Discharge status: Home / Self Care | Payer: BLUE CROSS/BLUE SHIELD | Source: Ambulatory Visit | Attending: Women's Health | Admitting: Women's Health

## 2014-12-09 VITALS — BP 124/80 | Ht 62.0 in | Wt 174.0 lb

## 2014-12-09 DIAGNOSIS — Z13220 Encounter for screening for lipoid disorders: Secondary | ICD-10-CM

## 2014-12-09 DIAGNOSIS — Z1329 Encounter for screening for other suspected endocrine disorder: Secondary | ICD-10-CM | POA: Diagnosis not present

## 2014-12-09 DIAGNOSIS — Z01411 Encounter for gynecological examination (general) (routine) with abnormal findings: Secondary | ICD-10-CM | POA: Diagnosis present

## 2014-12-09 DIAGNOSIS — Z1151 Encounter for screening for human papillomavirus (HPV): Secondary | ICD-10-CM | POA: Diagnosis not present

## 2014-12-09 DIAGNOSIS — Z01419 Encounter for gynecological examination (general) (routine) without abnormal findings: Secondary | ICD-10-CM | POA: Diagnosis not present

## 2014-12-09 LAB — COMPREHENSIVE METABOLIC PANEL
ALT: 23 U/L (ref 6–29)
AST: 20 U/L (ref 10–35)
Albumin: 4.3 g/dL (ref 3.6–5.1)
Alkaline Phosphatase: 95 U/L (ref 33–115)
BUN: 17 mg/dL (ref 7–25)
CHLORIDE: 102 mmol/L (ref 98–110)
CO2: 27 mmol/L (ref 20–31)
CREATININE: 0.71 mg/dL (ref 0.50–1.10)
Calcium: 9.3 mg/dL (ref 8.6–10.2)
Glucose, Bld: 93 mg/dL (ref 65–99)
POTASSIUM: 3.8 mmol/L (ref 3.5–5.3)
SODIUM: 140 mmol/L (ref 135–146)
TOTAL PROTEIN: 7.1 g/dL (ref 6.1–8.1)
Total Bilirubin: 0.8 mg/dL (ref 0.2–1.2)

## 2014-12-09 LAB — CBC WITH DIFFERENTIAL/PLATELET
BASOS PCT: 0 % (ref 0–1)
Basophils Absolute: 0 10*3/uL (ref 0.0–0.1)
EOS ABS: 0.2 10*3/uL (ref 0.0–0.7)
EOS PCT: 2 % (ref 0–5)
HCT: 39.4 % (ref 36.0–46.0)
Hemoglobin: 13.7 g/dL (ref 12.0–15.0)
LYMPHS ABS: 2.2 10*3/uL (ref 0.7–4.0)
Lymphocytes Relative: 29 % (ref 12–46)
MCH: 30 pg (ref 26.0–34.0)
MCHC: 34.8 g/dL (ref 30.0–36.0)
MCV: 86.2 fL (ref 78.0–100.0)
MONO ABS: 0.5 10*3/uL (ref 0.1–1.0)
MONOS PCT: 6 % (ref 3–12)
MPV: 9.9 fL (ref 8.6–12.4)
NEUTROS PCT: 63 % (ref 43–77)
Neutro Abs: 4.9 10*3/uL (ref 1.7–7.7)
PLATELETS: 180 10*3/uL (ref 150–400)
RBC: 4.57 MIL/uL (ref 3.87–5.11)
RDW: 14.1 % (ref 11.5–15.5)
WBC: 7.7 10*3/uL (ref 4.0–10.5)

## 2014-12-09 LAB — LIPID PANEL
CHOL/HDL RATIO: 3.3 ratio (ref <=5.0)
CHOLESTEROL: 179 mg/dL (ref 125–200)
HDL: 54 mg/dL (ref >=46)
LDL CALC: 106 mg/dL (ref <130)
Triglycerides: 93 mg/dL (ref <150)
VLDL: 19 mg/dL (ref <30)

## 2014-12-09 LAB — TSH: TSH: 2.515 u[IU]/mL (ref 0.350–4.500)

## 2014-12-09 NOTE — Progress Notes (Signed)
Miranda RegisterMichelle F Hill 1966/01/13 [id number]    History:    Presents for annual exam.   Postmenopausal/no HRT and no bleeding. Normal Pap and mammogram history. History of GDM. Has had  continued problems with stress incontinence especially with lifting status post sling procedure greater than 20 years ago.  Changes pad 1-2 times daily.  Past medical history, past surgical history, family history and social history were all reviewed and documented in the EPIC chart.  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 2 sons ages 10419 and 50 yo son with Asperger's.  Father heart disease, lung cancer, brain cancer /survivor in his 3080s. Twin sister and mother colon polyps[place]  ROS:  A ROS was performed and pertinent positives and negatives are included.  Exam:  Filed Vitals:   12/09/14 0845  BP: 124/80    General appearance:  Normal Thyroid:  Symmetrical, normal in size, without palpable masses or nodularity. Respiratory  Auscultation:  Clear without wheezing or rhonchi Cardiovascular  Auscultation:  Regular rate, without rubs, murmurs or gallops  Edema/varicosities:  Not grossly evident Abdominal  Soft,nontender, without masses, guarding or rebound.  Liver/spleen:  No organomegaly noted  Hernia:  None appreciated  Skin  Inspection:  Grossly normal   Breasts: Examined lying and sitting.     Right: Without masses, retractions, discharge or axillary adenopathy.     Left: Without masses, retractions, discharge or axillary adenopathy. Gentitourinary   Inguinal/mons:  Normal without inguinal adenopathy  External genitalia:  Normal  BUS/Urethra/Skene's glands:  Normal  Vagina:  Normal  Cervix:  Normal  Uterus:   normal in size, shape and contour.  Midline and mobile  Adnexa/parametria:     Rt: Without masses or tenderness.   Lt: Without masses or tenderness.  Anus and perineum: Normal  Digital rectal exam: Normal sphincter tone without palpated masses or  tenderness  Assessment/Plan:  49 y.o.  MWF G3 P2 for annual exam with complaint of occasional breast tenderness.  Postmenopausal/no HRT/no bleeding  stress incontinence  Status post sling History of GDM  Hypercholesterolemia on Lipitor-primary care manages labs/meds - requests FLP    Plan: SBE's,  Continue annual screening mammogram,regular exercise, calcium rich diet, vitamin D 2000 daily encouraged. FLP, UA, Pap with HR HPV typing, new screening guidelines reviewed. Declines referral to urologist. Reviewed importance of weight loss, decreasing abdominal weight which may help. Screening colonoscopy instructed to schedule.     Harrington ChallengerYOUNG,Miranda J [facility]owahWHNP, 10:48 AM 12/09/2014

## 2014-12-09 NOTE — Patient Instructions (Signed)

## 2014-12-10 LAB — URINALYSIS W MICROSCOPIC + REFLEX CULTURE
Bacteria, UA: NONE SEEN [HPF]
Bilirubin Urine: NEGATIVE
Casts: NONE SEEN [LPF]
Crystals: NONE SEEN [HPF]
Glucose, UA: NEGATIVE
Hgb urine dipstick: NEGATIVE
Ketones, ur: NEGATIVE
Leukocytes, UA: NEGATIVE
Nitrite: NEGATIVE
Protein, ur: NEGATIVE
Specific Gravity, Urine: 1.02 (ref 1.001–1.035)
WBC, UA: NONE SEEN WBC/HPF
Yeast: NONE SEEN [HPF]
pH: 6 (ref 5.0–8.0)

## 2014-12-11 LAB — URINE CULTURE: Colony Count: 30000

## 2014-12-12 LAB — CYTOLOGY - PAP

## 2015-03-20 ENCOUNTER — Other Ambulatory Visit: Payer: Self-pay

## 2015-03-20 DIAGNOSIS — Z1231 Encounter for screening mammogram for malignant neoplasm of breast: Secondary | ICD-10-CM

## 2015-04-14 ENCOUNTER — Ambulatory Visit
Admission: RE | Admit: 2015-04-14 | Discharge: 2015-04-14 | Disposition: A | Discharge status: Home / Self Care | Payer: BLUE CROSS/BLUE SHIELD | Source: Ambulatory Visit

## 2015-04-14 DIAGNOSIS — Z1231 Encounter for screening mammogram for malignant neoplasm of breast: Secondary | ICD-10-CM

## 2015-07-04 DIAGNOSIS — M5137 Other intervertebral disc degeneration, lumbosacral region: Secondary | ICD-10-CM | POA: Diagnosis not present

## 2015-07-04 DIAGNOSIS — M9903 Segmental and somatic dysfunction of lumbar region: Secondary | ICD-10-CM | POA: Diagnosis not present

## 2015-07-04 DIAGNOSIS — M9905 Segmental and somatic dysfunction of pelvic region: Secondary | ICD-10-CM | POA: Diagnosis not present

## 2015-07-04 DIAGNOSIS — Q72811 Congenital shortening of right lower limb: Secondary | ICD-10-CM | POA: Diagnosis not present

## 2015-07-12 DIAGNOSIS — M9903 Segmental and somatic dysfunction of lumbar region: Secondary | ICD-10-CM | POA: Diagnosis not present

## 2015-07-12 DIAGNOSIS — M5137 Other intervertebral disc degeneration, lumbosacral region: Secondary | ICD-10-CM | POA: Diagnosis not present

## 2015-07-12 DIAGNOSIS — Q72811 Congenital shortening of right lower limb: Secondary | ICD-10-CM | POA: Diagnosis not present

## 2015-07-12 DIAGNOSIS — M9905 Segmental and somatic dysfunction of pelvic region: Secondary | ICD-10-CM | POA: Diagnosis not present

## 2015-07-13 DIAGNOSIS — M9903 Segmental and somatic dysfunction of lumbar region: Secondary | ICD-10-CM | POA: Diagnosis not present

## 2015-07-13 DIAGNOSIS — M5137 Other intervertebral disc degeneration, lumbosacral region: Secondary | ICD-10-CM | POA: Diagnosis not present

## 2015-07-13 DIAGNOSIS — Q72811 Congenital shortening of right lower limb: Secondary | ICD-10-CM | POA: Diagnosis not present

## 2015-07-13 DIAGNOSIS — M9905 Segmental and somatic dysfunction of pelvic region: Secondary | ICD-10-CM | POA: Diagnosis not present

## 2015-07-20 DIAGNOSIS — M47816 Spondylosis without myelopathy or radiculopathy, lumbar region: Secondary | ICD-10-CM | POA: Diagnosis not present

## 2015-08-28 ENCOUNTER — Telehealth: Payer: Self-pay | Admitting: *Deleted

## 2015-08-28 NOTE — Telephone Encounter (Signed)
Pt called c/o that her husband smelled fishy odor, I advised husband schedule OV to see his PCP. Pt said she will relay.

## 2015-11-03 DIAGNOSIS — J209 Acute bronchitis, unspecified: Secondary | ICD-10-CM | POA: Diagnosis not present

## 2015-12-12 ENCOUNTER — Other Ambulatory Visit: Payer: Self-pay | Admitting: Women's Health

## 2015-12-12 ENCOUNTER — Ambulatory Visit (INDEPENDENT_AMBULATORY_CARE_PROVIDER_SITE_OTHER): Payer: BLUE CROSS/BLUE SHIELD | Admitting: Women's Health

## 2015-12-12 ENCOUNTER — Encounter: Payer: Self-pay | Admitting: Women's Health

## 2015-12-12 VITALS — BP 121/83 | Ht 63.0 in | Wt 180.4 lb

## 2015-12-12 DIAGNOSIS — N390 Urinary tract infection, site not specified: Secondary | ICD-10-CM | POA: Diagnosis not present

## 2015-12-12 DIAGNOSIS — Z1329 Encounter for screening for other suspected endocrine disorder: Secondary | ICD-10-CM

## 2015-12-12 DIAGNOSIS — Z01419 Encounter for gynecological examination (general) (routine) without abnormal findings: Secondary | ICD-10-CM | POA: Diagnosis not present

## 2015-12-12 DIAGNOSIS — E559 Vitamin D deficiency, unspecified: Secondary | ICD-10-CM | POA: Diagnosis not present

## 2015-12-12 LAB — COMPREHENSIVE METABOLIC PANEL
ALT: 32 U/L — AB (ref 6–29)
AST: 25 U/L (ref 10–35)
Albumin: 4.2 g/dL (ref 3.6–5.1)
Alkaline Phosphatase: 99 U/L (ref 33–130)
BILIRUBIN TOTAL: 0.7 mg/dL (ref 0.2–1.2)
BUN: 13 mg/dL (ref 7–25)
CO2: 31 mmol/L (ref 20–31)
CREATININE: 0.74 mg/dL (ref 0.50–1.05)
Calcium: 8.8 mg/dL (ref 8.6–10.4)
Chloride: 104 mmol/L (ref 98–110)
GLUCOSE: 99 mg/dL (ref 65–99)
Potassium: 3.8 mmol/L (ref 3.5–5.3)
SODIUM: 140 mmol/L (ref 135–146)
Total Protein: 6.9 g/dL (ref 6.1–8.1)

## 2015-12-12 LAB — CBC WITH DIFFERENTIAL/PLATELET
BASOS PCT: 0 %
Basophils Absolute: 0 cells/uL (ref 0–200)
EOS PCT: 2 %
Eosinophils Absolute: 156 cells/uL (ref 15–500)
HCT: 38.5 % (ref 35.0–45.0)
HEMOGLOBIN: 12.9 g/dL (ref 11.7–15.5)
LYMPHS ABS: 2184 {cells}/uL (ref 850–3900)
LYMPHS PCT: 28 %
MCH: 29.3 pg (ref 27.0–33.0)
MCHC: 33.5 g/dL (ref 32.0–36.0)
MCV: 87.5 fL (ref 80.0–100.0)
MPV: 10.1 fL (ref 7.5–12.5)
Monocytes Absolute: 468 cells/uL (ref 200–950)
Monocytes Relative: 6 %
NEUTROS PCT: 64 %
Neutro Abs: 4992 cells/uL (ref 1500–7800)
Platelets: 197 10*3/uL (ref 140–400)
RBC: 4.4 MIL/uL (ref 3.80–5.10)
RDW: 13.8 % (ref 11.0–15.0)
WBC: 7.8 10*3/uL (ref 3.8–10.8)

## 2015-12-12 LAB — TSH: TSH: 1.63 mIU/L

## 2015-12-12 NOTE — Patient Instructions (Signed)
Colonoscopy  Dr Carlean Purl  [phone]  Health Maintenance, Female Introduction Adopting a healthy lifestyle and getting preventive care can go a long way to promote health and wellness. Talk with your health care provider about what schedule of regular examinations is right for you. This is a good chance for you to check in with your provider about disease prevention and staying healthy. In between checkups, there are plenty of things you can do on your own. Experts have done a lot of research about which lifestyle changes and preventive measures are most likely to keep you healthy. Ask your health care provider for more information. Weight and diet Eat a healthy diet  Be sure to include plenty of vegetables, fruits, low-fat dairy products, and lean protein.  Do not eat a lot of foods high in solid fats, added sugars, or salt.  Get regular exercise. This is one of the most important things you can do for your health.  Most adults should exercise for at least 150 minutes each week. The exercise should increase your heart rate and make you sweat (moderate-intensity exercise).  Most adults should also do strengthening exercises at least twice a week. This is in addition to the moderate-intensity exercise. Maintain a healthy weight  Body mass index (BMI) is a measurement that can be used to identify possible weight problems. It estimates body fat based on height and weight. Your health care provider can help determine your BMI and help you achieve or maintain a healthy weight.  For females 51 years of age and older:  A BMI below 18.5 is considered underweight.  A BMI of 18.5 to 24.9 is normal.  A BMI of 25 to 29.9 is considered overweight.  A BMI of 30 and above is considered obese. Watch levels of cholesterol and blood lipids  You should start having your blood tested for lipids and cholesterol at 51 years of age, then have this test every 5 years.  You may need to have your cholesterol  levels checked more often if:  Your lipid or cholesterol levels are high.  You are older than 51 years of age.  You are at high risk for heart disease. Cancer screening Lung Cancer  Lung cancer screening is recommended for adults 51-80 years old who are at high risk for lung cancer because of a history of smoking.  A yearly low-dose CT scan of the lungs is recommended for people who:  Currently smoke.  Have quit within the past 15 years.  Have at least a 30-pack-year history of smoking. A pack year is smoking an average of one pack of cigarettes a day for 1 year.  Yearly screening should continue until it has been 15 years since you quit.  Yearly screening should stop if you develop a health problem that would prevent you from having lung cancer treatment. Breast Cancer  Practice breast self-awareness. This means understanding how your breasts normally appear and feel.  It also means doing regular breast self-exams. Let your health care provider know about any changes, no matter how small.  If you are in your 51s, you should have a clinical breast exam (CBE) by a health care provider every 1-3 years as part of a regular health exam.  If you are 51 or older, have a CBE every year. Also consider having a breast X-ray (mammogram) every year.  If you have a family history of breast cancer, talk to your health care provider about genetic screening.  If you  are at high risk for breast cancer, talk to your health care provider about having an MRI and a mammogram every year.  Breast cancer gene (BRCA) assessment is recommended for women who have family members with BRCA-related cancers. BRCA-related cancers include:  Breast.  Ovarian.  Tubal.  Peritoneal cancers.  Results of the assessment will determine the need for genetic counseling and BRCA1 and BRCA2 testing. Cervical Cancer  Your health care provider may recommend that you be screened regularly for cancer of the  pelvic organs (ovaries, uterus, and vagina). This screening involves a pelvic examination, including checking for microscopic changes to the surface of your cervix (Pap test). You may be encouraged to have this screening done every 3 years, beginning at age 51.  For women ages 9-65, health care providers may recommend pelvic exams and Pap testing every 3 years, or they may recommend the Pap and pelvic exam, combined with testing for human papilloma virus (HPV), every 5 years. Some types of HPV increase your risk of cervical cancer. Testing for HPV may also be done on women of any age with unclear Pap test results.  Other health care providers may not recommend any screening for nonpregnant women who are considered low risk for pelvic cancer and who do not have symptoms. Ask your health care provider if a screening pelvic exam is right for you.  If you have had past treatment for cervical cancer or a condition that could lead to cancer, you need Pap tests and screening for cancer for at least 20 years after your treatment. If Pap tests have been discontinued, your risk factors (such as having a new sexual partner) need to be reassessed to determine if screening should resume. Some women have medical problems that increase the chance of getting cervical cancer. In these cases, your health care provider may recommend more frequent screening and Pap tests. Colorectal Cancer  This type of cancer can be detected and often prevented.  Routine colorectal cancer screening usually begins at 51 years of age and continues through 51 years of age.  Your health care provider may recommend screening at an earlier age if you have risk factors for colon cancer.  Your health care provider may also recommend using home test kits to check for hidden blood in the stool.  A small camera at the end of a tube can be used to examine your colon directly (sigmoidoscopy or colonoscopy). This is done to check for the earliest  forms of colorectal cancer.  Routine screening usually begins at age 51.  Direct examination of the colon should be repeated every 5-10 years through 51 years of age. However, you may need to be screened more often if early forms of precancerous polyps or small growths are found. Skin Cancer  Check your skin from head to toe regularly.  Tell your health care provider about any new moles or changes in moles, especially if there is a change in a mole's shape or color.  Also tell your health care provider if you have a mole that is larger than the size of a pencil eraser.  Always use sunscreen. Apply sunscreen liberally and repeatedly throughout the day.  Protect yourself by wearing long sleeves, pants, a wide-brimmed hat, and sunglasses whenever you are outside. Heart disease, diabetes, and high blood pressure  High blood pressure causes heart disease and increases the risk of stroke. High blood pressure is more likely to develop in:  People who have blood pressure in the high end  of the normal range (130-139/85-89 mm Hg).  People who are overweight or obese.  People who are African American.  If you are 64-85 years of age, have your blood pressure checked every 3-5 years. If you are 29 years of age or older, have your blood pressure checked every year. You should have your blood pressure measured twice-once when you are at a hospital or clinic, and once when you are not at a hospital or clinic. Record the average of the two measurements. To check your blood pressure when you are not at a hospital or clinic, you can use:  An automated blood pressure machine at a pharmacy.  A home blood pressure monitor.  If you are between 36 years and 6 years old, ask your health care provider if you should take aspirin to prevent strokes.  Have regular diabetes screenings. This involves taking a blood sample to check your fasting blood sugar level.  If you are at a normal weight and have a low  risk for diabetes, have this test once every three years after 51 years of age.  If you are overweight and have a high risk for diabetes, consider being tested at a younger age or more often. Preventing infection Hepatitis B  If you have a higher risk for hepatitis B, you should be screened for this virus. You are considered at high risk for hepatitis B if:  You were born in a country where hepatitis B is common. Ask your health care provider which countries are considered high risk.  Your parents were born in a high-risk country, and you have not been immunized against hepatitis B (hepatitis B vaccine).  You have HIV or AIDS.  You use needles to inject street drugs.  You live with someone who has hepatitis B.  You have had sex with someone who has hepatitis B.  You get hemodialysis treatment.  You take certain medicines for conditions, including cancer, organ transplantation, and autoimmune conditions. Hepatitis C  Blood testing is recommended for:  Everyone born from [birth year] through 1966.  Anyone with known risk factors for hepatitis C. Sexually transmitted infections (STIs)  You should be screened for sexually transmitted infections (STIs) including gonorrhea and chlamydia if:  You are sexually active and are younger than 51 years of age.  You are older than 52 years of age and your health care provider tells you that you are at risk for this type of infection.  Your sexual activity has changed since you were last screened and you are at an increased risk for chlamydia or gonorrhea. Ask your health care provider if you are at risk.  If you do not have HIV, but are at risk, it may be recommended that you take a prescription medicine daily to prevent HIV infection. This is called pre-exposure prophylaxis (PrEP). You are considered at risk if:  You are sexually active and do not regularly use condoms or know the HIV status of your partner(s).  You take drugs by  injection.  You are sexually active with a partner who has HIV. Talk with your health care provider about whether you are at high risk of being infected with HIV. If you choose to begin PrEP, you should first be tested for HIV. You should then be tested every 3 months for as long as you are taking PrEP. Pregnancy  If you are premenopausal and you may become pregnant, ask your health care provider about preconception counseling.  If you may become pregnant, take  400 to 800 micrograms (mcg) of folic acid every day.  If you want to prevent pregnancy, talk to your health care provider about birth control (contraception). Osteoporosis and menopause  Osteoporosis is a disease in which the bones lose minerals and strength with aging. This can result in serious bone fractures. Your risk for osteoporosis can be identified using a bone density scan.  If you are 4 years of age or older, or if you are at risk for osteoporosis and fractures, ask your health care provider if you should be screened.  Ask your health care provider whether you should take a calcium or vitamin D supplement to lower your risk for osteoporosis.  Menopause may have certain physical symptoms and risks.  Hormone replacement therapy may reduce some of these symptoms and risks. Talk to your health care provider about whether hormone replacement therapy is right for you. Follow these instructions at home:  Schedule regular health, dental, and eye exams.  Stay current with your immunizations.  Do not use any tobacco products including cigarettes, chewing tobacco, or electronic cigarettes.  If you are pregnant, do not drink alcohol.  If you are breastfeeding, limit how much and how often you drink alcohol.  Limit alcohol intake to no more than 1 drink per day for nonpregnant women. One drink equals 12 ounces of beer, 5 ounces of wine, or 1 ounces of hard liquor.  Do not use street drugs.  Do not share needles.  Ask  your health care provider for help if you need support or information about quitting drugs.  Tell your health care provider if you often feel depressed.  Tell your health care provider if you have ever been abused or do not feel safe at home. This information is not intended to replace advice given to you by your health care provider. Make sure you discuss any questions you have with your health care provider. Document Released: 07/30/2010 Document Revised: 06/22/2015 Document Reviewed: 10/18/2014  2017 [employer]vier

## 2015-12-12 NOTE — Progress Notes (Signed)
Miranda Hill 02-05-1965 [id number]    History:    Presents for annual exam.  Postmenopausal on no HRT with no bleeding. Normal Pap and mammogram history. History of GDM diet only. Had a bladder sling greater than 20 years ago wears pads daily for stress incontinence. Difficulty losing weight despite exercise and diet. Mother diagnosed with colon cancer this past year currently on chemotherapy.  Past medical history, past surgical history, family history and social history were all reviewed and documented in the EPIC chart. Geologist, engineeringTeacher assistant for pre-K. 51 year old son type 1 diabetes and Asperger's, 51 year old son [employer] [place]. Father died last year with lung and brain cancer. Has a twin sister.  ROS:  A ROS was performed and pertinent positives and negatives are included.  Exam:  Vitals:   12/12/15 1411  BP: 121/83  Weight: 180 lb 6.4 oz (81.8 kg)  Height: 5\' 3"  (1.6 m)   Body mass index is 31.96 kg/m.   General appearance:  Normal Thyroid:  Symmetrical, normal in size, without palpable masses or nodularity. Respiratory  Auscultation:  Clear without wheezing or rhonchi Cardiovascular  Auscultation:  Regular rate, without rubs, murmurs or gallops  Edema/varicosities:  Not grossly evident Abdominal  Soft,nontender, without masses, guarding or rebound.  Liver/spleen:  No organomegaly noted  Hernia:  None appreciated  Skin  Inspection:  Grossly normal   Breasts: Examined lying and sitting.     Right: Without masses, retractions, discharge or axillary adenopathy.     Left: Without masses, retractions, discharge or axillary adenopathy. Gentitourinary   Inguinal/mons:  Normal without inguinal adenopathy  External genitalia:  Normal  BUS/Urethra/Skene's glands:  Normal  Vagina:  Normal  Cervix:  Normal Half centimeter affixed polyp at os  Uterus:   normal in size, shape and contour.  Midline and mobile  Adnexa/parametria:     Rt: Without masses or  tenderness.   Lt: Without masses or tenderness.  Anus and perineum: Normal  Digital rectal exam: Normal sphincter tone without palpated masses or tenderness  Assessment/Plan:  50 y.o. MWF G3 P2 for annual exam.    Postmenopausal on no HRT with no bleeding Bladder sling greater than 20 years ago/stress incontinence Mother diagnosed with colon cancer this year  Plan: SBE's, annual screening mammogram, calcium rich diet, vitamin D 1000 daily encouraged. Reviewed importance of regular daily exercise, fall prevention. CBC, TSH, CMP, lipid panel, vitamin D, UA, Pap normal 2016, new screening guidelines reviewed. Instructed to schedule screening colonoscopy, [facility] information given and reviewed.    Harrington ChallengerYOUNG,NANCY J [facility] WilliamsburgWHNP, 2:35 PM 12/12/2015

## 2015-12-13 ENCOUNTER — Other Ambulatory Visit: Payer: Self-pay | Admitting: Women's Health

## 2015-12-13 LAB — URINALYSIS W MICROSCOPIC + REFLEX CULTURE
Bilirubin Urine: NEGATIVE
CASTS: NONE SEEN [LPF]
CRYSTALS: NONE SEEN [HPF]
GLUCOSE, UA: NEGATIVE
HGB URINE DIPSTICK: NEGATIVE
Ketones, ur: NEGATIVE
Nitrite: NEGATIVE
PROTEIN: NEGATIVE
RBC / HPF: NONE SEEN RBC/HPF (ref <=2)
SQUAMOUS EPITHELIAL / LPF: NONE SEEN [HPF] (ref <=5)
Specific Gravity, Urine: 1.016 (ref 1.001–1.035)
YEAST: NONE SEEN [HPF]
pH: 6 (ref 5.0–8.0)

## 2015-12-13 LAB — VITAMIN D 25 HYDROXY (VIT D DEFICIENCY, FRACTURES): Vit D, 25-Hydroxy: 28 ng/mL — ABNORMAL LOW (ref 30–100)

## 2015-12-13 MED ORDER — VITAMIN D (ERGOCALCIFEROL) 1.25 MG (50000 UNIT) PO CAPS: 50000.0000 [IU] | ORAL_CAPSULE | ORAL | 0 refills | Status: DC

## 2015-12-14 ENCOUNTER — Encounter: Payer: Self-pay | Admitting: Women's Health

## 2015-12-14 ENCOUNTER — Other Ambulatory Visit: Payer: Self-pay | Admitting: Women's Health

## 2015-12-14 ENCOUNTER — Ambulatory Visit (INDEPENDENT_AMBULATORY_CARE_PROVIDER_SITE_OTHER): Payer: BLUE CROSS/BLUE SHIELD | Admitting: Women's Health

## 2015-12-14 VITALS — BP 148/80 | Ht 63.0 in | Wt 180.0 lb

## 2015-12-14 DIAGNOSIS — R350 Frequency of micturition: Secondary | ICD-10-CM | POA: Diagnosis not present

## 2015-12-14 DIAGNOSIS — N3001 Acute cystitis with hematuria: Secondary | ICD-10-CM | POA: Diagnosis not present

## 2015-12-14 LAB — URINALYSIS W MICROSCOPIC + REFLEX CULTURE
Bilirubin Urine: NEGATIVE
CASTS: NONE SEEN [LPF]
Crystals: NONE SEEN [HPF]
Glucose, UA: NEGATIVE
KETONES UR: NEGATIVE
LEUKOCYTES UA: NEGATIVE
NITRITE: NEGATIVE
PH: 5.5 (ref 5.0–8.0)
Protein, ur: NEGATIVE
YEAST: NONE SEEN [HPF]

## 2015-12-14 MED ORDER — SULFAMETHOXAZOLE-TRIMETHOPRIM 800-160 MG PO TABS: 1.0000 | ORAL_TABLET | Freq: Two times a day (BID) | ORAL | 0 refills | Status: DC

## 2015-12-14 NOTE — Patient Instructions (Signed)

## 2015-12-14 NOTE — Progress Notes (Signed)
Presents with complaint of increased urinary frequency, pressure with urination, scant amount of urine with frequency, nocturia 5-6 last night, symptoms started yesterday. States noted blood in the urine this morning. Denies vaginal discharge, back pain, fever. Postmenopausal on no HRT with no bleeding.  Exam: Appears well. No CVAT. Abdomen obese, soft, nontender. External genitalia within normal limits, speculum exam no discharge or blood noted, a manual no CMT or adnexal tenderness. Slight discomfort pressure at suprapubic area. UA: +2 blood, negative leukocytes, 0-5 WBCs, 10-20 rbc's, few bacteria  Probable UTI with hematuria  Plan: Septra twice daily for 3 days #6. UTI prevention discussed. Recheck clean-catch UA in 2 weeks for hematuria resolution. Urine culture pending.

## 2015-12-15 LAB — URINE CULTURE

## 2015-12-16 LAB — URINE CULTURE

## 2016-02-26 ENCOUNTER — Encounter: Payer: Self-pay | Admitting: Women's Health

## 2016-03-05 ENCOUNTER — Other Ambulatory Visit: Payer: Self-pay | Admitting: Women's Health

## 2016-03-05 DIAGNOSIS — Z1231 Encounter for screening mammogram for malignant neoplasm of breast: Secondary | ICD-10-CM

## 2016-03-09 ENCOUNTER — Other Ambulatory Visit: Payer: Self-pay | Admitting: Women's Health

## 2016-04-15 ENCOUNTER — Ambulatory Visit
Admission: RE | Admit: 2016-04-15 | Discharge: 2016-04-15 | Disposition: A | Discharge status: Home / Self Care | Payer: BLUE CROSS/BLUE SHIELD | Source: Ambulatory Visit | Attending: Women's Health | Admitting: Women's Health

## 2016-04-15 ENCOUNTER — Encounter: Payer: Self-pay | Admitting: Women's Health

## 2016-04-15 DIAGNOSIS — Z1231 Encounter for screening mammogram for malignant neoplasm of breast: Secondary | ICD-10-CM

## 2016-05-01 DIAGNOSIS — E041 Nontoxic single thyroid nodule: Secondary | ICD-10-CM | POA: Diagnosis not present

## 2016-05-01 DIAGNOSIS — Z800 Family history of malignant neoplasm of digestive organs: Secondary | ICD-10-CM | POA: Diagnosis not present

## 2016-05-15 ENCOUNTER — Other Ambulatory Visit: Payer: Self-pay | Admitting: Family Medicine

## 2016-05-15 DIAGNOSIS — E041 Nontoxic single thyroid nodule: Secondary | ICD-10-CM

## 2016-05-24 ENCOUNTER — Ambulatory Visit
Admission: RE | Admit: 2016-05-24 | Discharge: 2016-05-24 | Disposition: A | Discharge status: Home / Self Care | Payer: BLUE CROSS/BLUE SHIELD | Source: Ambulatory Visit | Attending: Family Medicine | Admitting: Family Medicine

## 2016-05-24 DIAGNOSIS — E042 Nontoxic multinodular goiter: Secondary | ICD-10-CM | POA: Diagnosis not present

## 2016-05-24 DIAGNOSIS — E041 Nontoxic single thyroid nodule: Secondary | ICD-10-CM

## 2016-05-31 DIAGNOSIS — Z800 Family history of malignant neoplasm of digestive organs: Secondary | ICD-10-CM | POA: Diagnosis not present

## 2016-05-31 DIAGNOSIS — D126 Benign neoplasm of colon, unspecified: Secondary | ICD-10-CM | POA: Diagnosis not present

## 2016-05-31 DIAGNOSIS — Z1211 Encounter for screening for malignant neoplasm of colon: Secondary | ICD-10-CM | POA: Diagnosis not present

## 2016-06-04 DIAGNOSIS — D126 Benign neoplasm of colon, unspecified: Secondary | ICD-10-CM | POA: Diagnosis not present

## 2016-06-12 ENCOUNTER — Encounter: Payer: Self-pay | Admitting: Gynecology

## 2017-02-12 ENCOUNTER — Encounter: Payer: BLUE CROSS/BLUE SHIELD | Admitting: Women's Health

## 2017-02-17 ENCOUNTER — Encounter: Payer: Self-pay | Admitting: Women's Health

## 2017-02-17 ENCOUNTER — Ambulatory Visit: Payer: BLUE CROSS/BLUE SHIELD | Admitting: Women's Health

## 2017-02-17 VITALS — BP 122/80 | Ht 63.0 in | Wt 181.0 lb

## 2017-02-17 DIAGNOSIS — Z01419 Encounter for gynecological examination (general) (routine) without abnormal findings: Secondary | ICD-10-CM | POA: Diagnosis not present

## 2017-02-17 NOTE — Progress Notes (Signed)
Almon RegisterMichelle F Mihalic 1965-08-20 [id number]    History:    Presents for annual exam.  Postmenopausal on no HRT with no bleeding. Normal Pap and mammogram history. History of a bladder sling greater than 20 years ago with good relief of symptoms. 2018 3 benign colon polyps 3 year follow-up. Mother colon cancer survivor. Father deceased from lung and brain cancer. Has a twin sister that also had colon polyps.   Past medical history, past surgical history, family history and social history were all reviewed and documented in the EPIC chart. Preschool teacher, [employer] program. 53 year old son with diabetes and Asperger's, 53 year old son senior at [employer]'s father committed suicide several months ago. History of GDM diet only.  ROS:  A ROS was performed and pertinent positives and negatives are included.  Exam:  Vitals:   02/17/17 1154  BP: 122/80  Weight: 181 lb (82.1 kg)  Height: 5\' 3"  (1.6 m)   Body mass index is 32.06 kg/m.   General appearance:  Normal Thyroid:  Symmetrical, normal in size, without palpable masses or nodularity. Respiratory  Auscultation:  Clear without wheezing or rhonchi Cardiovascular  Auscultation:  Regular rate, without rubs, murmurs or gallops  Edema/varicosities:  Not grossly evident Abdominal  Soft,nontender, without masses, guarding or rebound.  Liver/spleen:  No organomegaly noted  Hernia:  None appreciated  Skin  Inspection:  Grossly normal   Breasts: Examined lying and sitting.     Right: Without masses, retractions, discharge or axillary adenopathy.     Left: Without masses, retractions, discharge or axillary adenopathy. Gentitourinary   Inguinal/mons:  Normal without inguinal adenopathy  External genitalia:  Normal  BUS/Urethra/Skene's glands:  Normal  Vagina:  Normal  Cervix:  Normal  Uterus:  normal in size, shape and contour.  Midline and mobile  Adnexa/parametria:     Rt: Without masses or tenderness.   Lt: Without masses or  tenderness.  Anus and perineum: Normal  Digital rectal exam: Normal sphincter tone without palpated masses or tenderness  Assessment/Plan:  52 y.o. MWF G3 P2 for annual exam with no complaints.  Postmenopausal/no HRT/no bleeding Obesity  Plan: Reviewed importance of increasing exercise and decreasing calories. SBE's, continue annual screening mammogram, calcium rich diet, vitamin D 2000 daily encouraged. CBC, glucose, Pap with HR HPV typing, new screening guidelines reviewed. Instructed to come next year fasting for lipid panel.    Harrington Challengerancy J Darlis Wragg [facility] HospitalWHNP, 1:19 PM 02/17/2017

## 2017-02-17 NOTE — Patient Instructions (Addendum)
Health Maintenance for Postmenopausal Women Menopause is a normal process in which your reproductive ability comes to an end. This process happens gradually over a span of months to years, usually between the ages of 22 and 9. Menopause is complete when you have missed 12 consecutive menstrual periods. It is important to talk with your health care provider about some of the most common conditions that affect postmenopausal women, such as heart disease, cancer, and bone loss (osteoporosis). Adopting a healthy lifestyle and getting preventive care can help to promote your health and wellness. Those actions can also lower your chances of developing some of these common conditions. What should I know about menopause? During menopause, you may experience a number of symptoms, such as:  Moderate-to-severe hot flashes.  Night sweats.  Decrease in sex drive.  Mood swings.  Headaches.  Tiredness.  Irritability.  Memory problems.  Insomnia.  Choosing to treat or not to treat menopausal changes is an individual decision that you make with your health care provider. What should I know about hormone replacement therapy and supplements? Hormone therapy products are effective for treating symptoms that are associated with menopause, such as hot flashes and night sweats. Hormone replacement carries certain risks, especially as you become older. If you are thinking about using estrogen or estrogen with progestin treatments, discuss the benefits and risks with your health care provider. What should I know about heart disease and stroke? Heart disease, heart attack, and stroke become more likely as you age. This may be due, in part, to the hormonal changes that your body experiences during menopause. These can affect how your body processes dietary fats, triglycerides, and cholesterol. Heart attack and stroke are both medical emergencies. There are many things that you can do to help prevent heart disease  and stroke:  Have your blood pressure checked at least every 1-2 years. High blood pressure causes heart disease and increases the risk of stroke.  If you are 53-22 years old, ask your health care provider if you should take aspirin to prevent a heart attack or a stroke.  Do not use any tobacco products, including cigarettes, chewing tobacco, or electronic cigarettes. If you need help quitting, ask your health care provider.  It is important to eat a healthy diet and maintain a healthy weight. ? Be sure to include plenty of vegetables, fruits, low-fat dairy products, and lean protein. ? Avoid eating foods that are high in solid fats, added sugars, or salt (sodium).  Get regular exercise. This is one of the most important things that you can do for your health. ? Try to exercise for at least 150 minutes each week. The type of exercise that you do should increase your heart rate and make you sweat. This is known as moderate-intensity exercise. ? Try to do strengthening exercises at least twice each week. Do these in addition to the moderate-intensity exercise.  Know your numbers.Ask your health care provider to check your cholesterol and your blood glucose. Continue to have your blood tested as directed by your health care provider.  What should I know about cancer screening? There are several types of cancer. Take the following steps to reduce your risk and to catch any cancer development as early as possible. Breast Cancer  Practice breast self-awareness. ? This means understanding how your breasts normally appear and feel. ? It also means doing regular breast self-exams. Let your health care provider know about any changes, no matter how small.  If you are 40  or older, have a clinician do a breast exam (clinical breast exam or CBE) every year. Depending on your age, family history, and medical history, it may be recommended that you also have a yearly breast X-ray (mammogram).  If you  have a family history of breast cancer, talk with your health care provider about genetic screening.  If you are at high risk for breast cancer, talk with your health care provider about having an MRI and a mammogram every year.  Breast cancer (BRCA) gene test is recommended for women who have family members with BRCA-related cancers. Results of the assessment will determine the need for genetic counseling and BRCA1 and for BRCA2 testing. BRCA-related cancers include these types: ? Breast. This occurs in males or females. ? Ovarian. ? Tubal. This may also be called fallopian tube cancer. ? Cancer of the abdominal or pelvic lining (peritoneal cancer). ? Prostate. ? Pancreatic.  Cervical, Uterine, and Ovarian Cancer Your health care provider may recommend that you be screened regularly for cancer of the pelvic organs. These include your ovaries, uterus, and vagina. This screening involves a pelvic exam, which includes checking for microscopic changes to the surface of your cervix (Pap test).  For women ages 21-65, health care providers may recommend a pelvic exam and a Pap test every three years. For women ages 79-65, they may recommend the Pap test and pelvic exam, combined with testing for human papilloma virus (HPV), every five years. Some types of HPV increase your risk of cervical cancer. Testing for HPV may also be done on women of any age who have unclear Pap test results.  Other health care providers may not recommend any screening for nonpregnant women who are considered low risk for pelvic cancer and have no symptoms. Ask your health care provider if a screening pelvic exam is right for you.  If you have had past treatment for cervical cancer or a condition that could lead to cancer, you need Pap tests and screening for cancer for at least 20 years after your treatment. If Pap tests have been discontinued for you, your risk factors (such as having a new sexual partner) need to be  reassessed to determine if you should start having screenings again. Some women have medical problems that increase the chance of getting cervical cancer. In these cases, your health care provider may recommend that you have screening and Pap tests more often.  If you have a family history of uterine cancer or ovarian cancer, talk with your health care provider about genetic screening.  If you have vaginal bleeding after reaching menopause, tell your health care provider.  There are currently no reliable tests available to screen for ovarian cancer.  Lung Cancer Lung cancer screening is recommended for adults 69-62 years old who are at high risk for lung cancer because of a history of smoking. A yearly low-dose CT scan of the lungs is recommended if you:  Currently smoke.  Have a history of at least 30 pack-years of smoking and you currently smoke or have quit within the past 15 years. A pack-year is smoking an average of one pack of cigarettes per day for one year.  Yearly screening should:  Continue until it has been 15 years since you quit.  Stop if you develop a health problem that would prevent you from having lung cancer treatment.  Colorectal Cancer  This type of cancer can be detected and can often be prevented.  Routine colorectal cancer screening usually begins at  age 42 and continues through age 45.  If you have risk factors for colon cancer, your health care provider may recommend that you be screened at an earlier age.  If you have a family history of colorectal cancer, talk with your health care provider about genetic screening.  Your health care provider may also recommend using home test kits to check for hidden blood in your stool.  A small camera at the end of a tube can be used to examine your colon directly (sigmoidoscopy or colonoscopy). This is done to check for the earliest forms of colorectal cancer.  Direct examination of the colon should be repeated every  5-10 years until age 71. However, if early forms of precancerous polyps or small growths are found or if you have a family history or genetic risk for colorectal cancer, you may need to be screened more often.  Skin Cancer  Check your skin from head to toe regularly.  Monitor any moles. Be sure to tell your health care provider: ? About any new moles or changes in moles, especially if there is a change in a mole's shape or color. ? If you have a mole that is larger than the size of a pencil eraser.  If any of your family members has a history of skin cancer, especially at a Harwood Nall age, talk with your health care provider about genetic screening.  Always use sunscreen. Apply sunscreen liberally and repeatedly throughout the day.  Whenever you are outside, protect yourself by wearing long sleeves, pants, a wide-brimmed hat, and sunglasses.  What should I know about osteoporosis? Osteoporosis is a condition in which bone destruction happens more quickly than new bone creation. After menopause, you may be at an increased risk for osteoporosis. To help prevent osteoporosis or the bone fractures that can happen because of osteoporosis, the following is recommended:  If you are 46-71 years old, get at least 1,000 mg of calcium and at least 600 mg of vitamin D per day.  If you are older than age 55 but younger than age 65, get at least 1,200 mg of calcium and at least 600 mg of vitamin D per day.  If you are older than age 54, get at least 1,200 mg of calcium and at least 800 mg of vitamin D per day.  Smoking and excessive alcohol intake increase the risk of osteoporosis. Eat foods that are rich in calcium and vitamin D, and do weight-bearing exercises several times each week as directed by your health care provider. What should I know about how menopause affects my mental health? Depression may occur at any age, but it is more common as you become older. Common symptoms of depression  include:  Low or sad mood.  Changes in sleep patterns.  Changes in appetite or eating patterns.  Feeling an overall lack of motivation or enjoyment of activities that you previously enjoyed.  Frequent crying spells.  Talk with your health care provider if you think that you are experiencing depression. What should I know about immunizations? It is important that you get and maintain your immunizations. These include:  Tetanus, diphtheria, and pertussis (Tdap) booster vaccine.  Influenza every year before the flu season begins.  Pneumonia vaccine.  Shingles vaccine.  Your health care provider may also recommend other immunizations. This information is not intended to replace advice given to you by your health care provider. Make sure you discuss any questions you have with your health care provider. Document Released: 03/08/2005  Document Revised: 08/04/2015 Document Reviewed: 10/18/2014 Elsevier Interactive Patient Education  2018 [employer]  Health Maintenance for Postmenopausal Women Menopause is a normal process in which your reproductive ability comes to an end. This process happens gradually over a span of months to years, usually between the ages of 48 and 55. Menopause is complete when you have missed 12 consecutive menstrual periods. It is important to talk with your health care provider about some of the most common conditions that affect postmenopausal women, such as heart disease, cancer, and bone loss (osteoporosis). Adopting a healthy lifestyle and getting preventive care can help to promote your health and wellness. Those actions can also lower your chances of developing some of these common conditions. What should I know about menopause? During menopause, you may experience a number of symptoms, such as:  Moderate-to-severe hot flashes.  Night sweats.  Decrease in sex drive.  Mood swings.  Headaches.  Tiredness.  Irritability.  Memory  problems.  Insomnia.  Choosing to treat or not to treat menopausal changes is an individual decision that you make with your health care provider. What should I know about hormone replacement therapy and supplements? Hormone therapy products are effective for treating symptoms that are associated with menopause, such as hot flashes and night sweats. Hormone replacement carries certain risks, especially as you become older. If you are thinking about using estrogen or estrogen with progestin treatments, discuss the benefits and risks with your health care provider. What should I know about heart disease and stroke? Heart disease, heart attack, and stroke become more likely as you age. This may be due, in part, to the hormonal changes that your body experiences during menopause. These can affect how your body processes dietary fats, triglycerides, and cholesterol. Heart attack and stroke are both medical emergencies. There are many things that you can do to help prevent heart disease and stroke:  Have your blood pressure checked at least every 1-2 years. High blood pressure causes heart disease and increases the risk of stroke.  If you are 55-79 years old, ask your health care provider if you should take aspirin to prevent a heart attack or a stroke.  Do not use any tobacco products, including cigarettes, chewing tobacco, or electronic cigarettes. If you need help quitting, ask your health care provider.  It is important to eat a healthy diet and maintain a healthy weight. ? Be sure to include plenty of vegetables, fruits, low-fat dairy products, and lean protein. ? Avoid eating foods that are high in solid fats, added sugars, or salt (sodium).  Get regular exercise. This is one of the most important things that you can do for your health. ? Try to exercise for at least 150 minutes each week. The type of exercise that you do should increase your heart rate and make you sweat. This is known as  moderate-intensity exercise. ? Try to do strengthening exercises at least twice each week. Do these in addition to the moderate-intensity exercise.  Know your numbers.Ask your health care provider to check your cholesterol and your blood glucose. Continue to have your blood tested as directed by your health care provider.  What should I know about cancer screening? There are several types of cancer. Take the following steps to reduce your risk and to catch any cancer development as early as possible. Breast Cancer  Practice breast self-awareness. ? This means understanding how your breasts normally appear and feel. ? It also means doing regular breast self-exams. Let your   health care provider know about any changes, no matter how small.  If you are 40 or older, have a clinician do a breast exam (clinical breast exam or CBE) every year. Depending on your age, family history, and medical history, it may be recommended that you also have a yearly breast X-ray (mammogram).  If you have a family history of breast cancer, talk with your health care provider about genetic screening.  If you are at high risk for breast cancer, talk with your health care provider about having an MRI and a mammogram every year.  Breast cancer (BRCA) gene test is recommended for women who have family members with BRCA-related cancers. Results of the assessment will determine the need for genetic counseling and BRCA1 and for BRCA2 testing. BRCA-related cancers include these types: ? Breast. This occurs in males or females. ? Ovarian. ? Tubal. This may also be called fallopian tube cancer. ? Cancer of the abdominal or pelvic lining (peritoneal cancer). ? Prostate. ? Pancreatic.  Cervical, Uterine, and Ovarian Cancer Your health care provider may recommend that you be screened regularly for cancer of the pelvic organs. These include your ovaries, uterus, and vagina. This screening involves a pelvic exam, which  includes checking for microscopic changes to the surface of your cervix (Pap test).  For women ages 21-65, health care providers may recommend a pelvic exam and a Pap test every three years. For women ages 30-65, they may recommend the Pap test and pelvic exam, combined with testing for human papilloma virus (HPV), every five years. Some types of HPV increase your risk of cervical cancer. Testing for HPV may also be done on women of any age who have unclear Pap test results.  Other health care providers may not recommend any screening for nonpregnant women who are considered low risk for pelvic cancer and have no symptoms. Ask your health care provider if a screening pelvic exam is right for you.  If you have had past treatment for cervical cancer or a condition that could lead to cancer, you need Pap tests and screening for cancer for at least 20 years after your treatment. If Pap tests have been discontinued for you, your risk factors (such as having a new sexual partner) need to be reassessed to determine if you should start having screenings again. Some women have medical problems that increase the chance of getting cervical cancer. In these cases, your health care provider may recommend that you have screening and Pap tests more often.  If you have a family history of uterine cancer or ovarian cancer, talk with your health care provider about genetic screening.  If you have vaginal bleeding after reaching menopause, tell your health care provider.  There are currently no reliable tests available to screen for ovarian cancer.  Lung Cancer Lung cancer screening is recommended for adults 55-80 years old who are at high risk for lung cancer because of a history of smoking. A yearly low-dose CT scan of the lungs is recommended if you:  Currently smoke.  Have a history of at least 30 pack-years of smoking and you currently smoke or have quit within the past 15 years. A pack-year is smoking an  average of one pack of cigarettes per day for one year.  Yearly screening should:  Continue until it has been 15 years since you quit.  Stop if you develop a health problem that would prevent you from having lung cancer treatment.  Colorectal Cancer  This type of cancer   can be detected and can often be prevented.  Routine colorectal cancer screening usually begins at age 93 and continues through age 26.  If you have risk factors for colon cancer, your health care provider may recommend that you be screened at an earlier age.  If you have a family history of colorectal cancer, talk with your health care provider about genetic screening.  Your health care provider may also recommend using home test kits to check for hidden blood in your stool.  A small camera at the end of a tube can be used to examine your colon directly (sigmoidoscopy or colonoscopy). This is done to check for the earliest forms of colorectal cancer.  Direct examination of the colon should be repeated every 5-10 years until age 38. However, if early forms of precancerous polyps or small growths are found or if you have a family history or genetic risk for colorectal cancer, you may need to be screened more often.  Skin Cancer  Check your skin from head to toe regularly.  Monitor any moles. Be sure to tell your health care provider: ? About any new moles or changes in moles, especially if there is a change in a mole's shape or color. ? If you have a mole that is larger than the size of a pencil eraser.  If any of your family members has a history of skin cancer, especially at a Krish Bailly age, talk with your health care provider about genetic screening.  Always use sunscreen. Apply sunscreen liberally and repeatedly throughout the day.  Whenever you are outside, protect yourself by wearing long sleeves, pants, a wide-brimmed hat, and sunglasses.  What should I know about osteoporosis? Osteoporosis is a condition in  which bone destruction happens more quickly than new bone creation. After menopause, you may be at an increased risk for osteoporosis. To help prevent osteoporosis or the bone fractures that can happen because of osteoporosis, the following is recommended:  If you are 11-31 years old, get at least 1,000 mg of calcium and at least 600 mg of vitamin D per day.  If you are older than age 66 but younger than age 45, get at least 1,200 mg of calcium and at least 600 mg of vitamin D per day.  If you are older than age 10, get at least 1,200 mg of calcium and at least 800 mg of vitamin D per day.  Smoking and excessive alcohol intake increase the risk of osteoporosis. Eat foods that are rich in calcium and vitamin D, and do weight-bearing exercises several times each week as directed by your health care provider. What should I know about how menopause affects my mental health? Depression may occur at any age, but it is more common as you become older. Common symptoms of depression include:  Low or sad mood.  Changes in sleep patterns.  Changes in appetite or eating patterns.  Feeling an overall lack of motivation or enjoyment of activities that you previously enjoyed.  Frequent crying spells.  Talk with your health care provider if you think that you are experiencing depression. What should I know about immunizations? It is important that you get and maintain your immunizations. These include:  Tetanus, diphtheria, and pertussis (Tdap) booster vaccine.  Influenza every year before the flu season begins.  Pneumonia vaccine.  Shingles vaccine.  Your health care provider may also recommend other immunizations. This information is not intended to replace advice given to you by your health care provider. Make  sure you discuss any questions you have with your health care provider. Document Released: 03/08/2005 Document Revised: 08/04/2015 Document Reviewed: 10/18/2014 Elsevier Interactive  Patient Education  2018 [employer].

## 2017-02-17 NOTE — Addendum Note (Signed)
Addended by: Tito DineBONHAM, KIM A on: 02/17/2017 02:17 PM   Modules accepted: Orders

## 2017-02-19 ENCOUNTER — Other Ambulatory Visit: Payer: Self-pay | Admitting: Women's Health

## 2017-02-19 DIAGNOSIS — E7800 Pure hypercholesterolemia, unspecified: Secondary | ICD-10-CM

## 2017-02-19 DIAGNOSIS — R7309 Other abnormal glucose: Secondary | ICD-10-CM

## 2017-02-19 LAB — PAP, TP IMAGING W/ HPV RNA, RFLX HPV TYPE 16,18/45: HPV DNA HIGH RISK: NOT DETECTED

## 2017-02-19 LAB — CBC WITH DIFFERENTIAL/PLATELET
BASOS ABS: 50 {cells}/uL (ref 0–200)
BASOS PCT: 0.6 %
EOS ABS: 199 {cells}/uL (ref 15–500)
Eosinophils Relative: 2.4 %
HCT: 38.7 % (ref 35.0–45.0)
Hemoglobin: 13.2 g/dL (ref 11.7–15.5)
Lymphs Abs: 2299 cells/uL (ref 850–3900)
MCH: 29.7 pg (ref 27.0–33.0)
MCHC: 34.1 g/dL (ref 32.0–36.0)
MCV: 87.2 fL (ref 80.0–100.0)
MONOS PCT: 6.9 %
MPV: 11 fL (ref 7.5–12.5)
NEUTROS PCT: 62.4 %
Neutro Abs: 5179 cells/uL (ref 1500–7800)
PLATELETS: 177 10*3/uL (ref 140–400)
RBC: 4.44 10*6/uL (ref 3.80–5.10)
RDW: 12.9 % (ref 11.0–15.0)
TOTAL LYMPHOCYTE: 27.7 %
WBC: 8.3 10*3/uL (ref 3.8–10.8)
WBCMIX: 573 {cells}/uL (ref 200–950)

## 2017-02-19 LAB — HEMOGLOBIN A1C
EAG (MMOL/L): 6.5 (calc)
HEMOGLOBIN A1C: 5.7 %{Hb} — AB (ref <5.7)
Mean Plasma Glucose: 117 (calc)

## 2017-02-19 LAB — TEST AUTHORIZATION

## 2017-02-19 LAB — GLUCOSE, RANDOM: GLUCOSE: 141 mg/dL — AB (ref 65–99)

## 2017-03-04 ENCOUNTER — Other Ambulatory Visit: Payer: Self-pay | Admitting: Women's Health

## 2017-03-04 DIAGNOSIS — Z1231 Encounter for screening mammogram for malignant neoplasm of breast: Secondary | ICD-10-CM

## 2017-04-01 DIAGNOSIS — J329 Chronic sinusitis, unspecified: Secondary | ICD-10-CM | POA: Diagnosis not present

## 2017-04-02 DIAGNOSIS — Z111 Encounter for screening for respiratory tuberculosis: Secondary | ICD-10-CM | POA: Diagnosis not present

## 2017-04-08 DIAGNOSIS — R599 Enlarged lymph nodes, unspecified: Secondary | ICD-10-CM | POA: Diagnosis not present

## 2017-04-08 DIAGNOSIS — J329 Chronic sinusitis, unspecified: Secondary | ICD-10-CM | POA: Diagnosis not present

## 2017-04-17 ENCOUNTER — Encounter: Payer: Self-pay | Admitting: Women's Health

## 2017-04-17 ENCOUNTER — Ambulatory Visit
Admission: RE | Admit: 2017-04-17 | Discharge: 2017-04-17 | Disposition: A | Discharge status: Home / Self Care | Payer: BLUE CROSS/BLUE SHIELD | Source: Ambulatory Visit | Attending: Women's Health | Admitting: Women's Health

## 2017-04-17 DIAGNOSIS — Z1231 Encounter for screening mammogram for malignant neoplasm of breast: Secondary | ICD-10-CM | POA: Diagnosis not present

## 2017-10-09 ENCOUNTER — Encounter: Payer: Self-pay | Admitting: Podiatry

## 2017-10-09 ENCOUNTER — Ambulatory Visit: Payer: BLUE CROSS/BLUE SHIELD | Admitting: Podiatry

## 2017-10-09 ENCOUNTER — Ambulatory Visit (INDEPENDENT_AMBULATORY_CARE_PROVIDER_SITE_OTHER): Payer: BLUE CROSS/BLUE SHIELD

## 2017-10-09 VITALS — BP 140/88 | HR 85 | Resp 16

## 2017-10-09 DIAGNOSIS — M7662 Achilles tendinitis, left leg: Secondary | ICD-10-CM | POA: Diagnosis not present

## 2017-10-09 DIAGNOSIS — M779 Enthesopathy, unspecified: Secondary | ICD-10-CM

## 2017-10-09 MED ORDER — DICLOFENAC SODIUM 75 MG PO TBEC: 75.0000 mg | DELAYED_RELEASE_TABLET | Freq: Two times a day (BID) | ORAL | 2 refills | Status: DC

## 2017-10-09 NOTE — Progress Notes (Signed)
   Subjective:    Patient ID: Miranda Hill, female    DOB: 05-02-1965, 52 y.o.   MRN: [id number]  HPI    Review of Systems  All other systems reviewed and are negative.      Objective:   Physical Exam        Assessment & Plan:

## 2017-10-09 NOTE — Patient Instructions (Signed)

## 2017-10-11 NOTE — Progress Notes (Signed)
Subjective:   Patient ID: Miranda RegisterMichelle F Jakel, female   DOB: 52 y.o.   MRN: [id number]   HPI patient presents with significant discomfort in the back of the left heel stating its been hurting her for a while and worse recently and the patient was wearing a certain type of boot which seems to aggravate it.  Patient does feel like there is a lump back there that is gotten somewhat better than that the pain remains and patient does not smoke and likes to be active   Review of Systems  All other systems reviewed and are negative.       Objective:  Physical Exam  Constitutional: She appears well-developed and well-nourished.  Cardiovascular: Intact distal pulses.  Pulmonary/Chest: Effort normal.  Musculoskeletal: Normal range of motion.  Neurological: She is alert.  Skin: Skin is warm.  Nursing note and vitals reviewed.   Neurovascular status found to be intact muscle strength is adequate range of motion within normal limits with patient noted to have exquisite discomfort posterior aspect left heel at the muscle tendon junction with insertional pain that is not present currently.  Patient has moderate equinus condition which is also contributory to the problem and was found to have good digital perfusion and is well oriented x3     Assessment:  Acute posterior Achilles tendinitis left at the muscle tendon junction     Plan:  H&P condition reviewed and discussed that this is a pre-tear like environment under.do not recommend anything aggressive but I do think we need to allow it to rest with consideration of long-term for physical therapy or some kind of topical therapy.  At this point I applied air fracture walker with instructions on usage along with heat ice therapy and gradual stretching exercises and will be seen back again in approximately 4 weeks and may require MRI at this  X-ray indicates no indications of posterior spur with some thickness of the soft tissue at the muscle tendon  junction

## 2017-12-30 ENCOUNTER — Other Ambulatory Visit: Payer: Self-pay | Admitting: Podiatry

## 2017-12-30 MED ORDER — DICLOFENAC SODIUM 75 MG PO TBEC: 75.0000 mg | DELAYED_RELEASE_TABLET | Freq: Two times a day (BID) | ORAL | 2 refills | Status: DC

## 2018-01-14 DIAGNOSIS — J018 Other acute sinusitis: Secondary | ICD-10-CM | POA: Diagnosis not present

## 2018-01-14 DIAGNOSIS — R030 Elevated blood-pressure reading, without diagnosis of hypertension: Secondary | ICD-10-CM | POA: Diagnosis not present

## 2018-01-22 DIAGNOSIS — H10023 Other mucopurulent conjunctivitis, bilateral: Secondary | ICD-10-CM | POA: Diagnosis not present

## 2018-01-22 DIAGNOSIS — J018 Other acute sinusitis: Secondary | ICD-10-CM | POA: Diagnosis not present

## 2018-02-18 ENCOUNTER — Encounter: Payer: BLUE CROSS/BLUE SHIELD | Admitting: Women's Health

## 2018-02-18 ENCOUNTER — Ambulatory Visit (INDEPENDENT_AMBULATORY_CARE_PROVIDER_SITE_OTHER): Payer: BLUE CROSS/BLUE SHIELD | Admitting: Women's Health

## 2018-02-18 ENCOUNTER — Encounter: Payer: Self-pay | Admitting: Women's Health

## 2018-02-18 VITALS — BP 124/80 | Ht 63.0 in | Wt 184.0 lb

## 2018-02-18 DIAGNOSIS — Z01419 Encounter for gynecological examination (general) (routine) without abnormal findings: Secondary | ICD-10-CM

## 2018-02-18 DIAGNOSIS — R7309 Other abnormal glucose: Secondary | ICD-10-CM | POA: Diagnosis not present

## 2018-02-18 NOTE — Progress Notes (Signed)
Miranda Hill 1965-12-15 [id number]    History:    Presents for annual exam.  Postmenopausal on no HRT with no bleeding.  Normal Pap and mammogram history.  History of a bladder suspension greater than 20 years ago with good relief.  2017- colon polyp 5-year follow-up.  History of benign thyroid nodule.  History of GDM.  Past medical history, past surgical history, family history and social history were all reviewed and documented in the EPIC chart.  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  2 sons oldest son doing well with type 1 diabetes Asperger,  good job, younger son graduated from [employer] in Public relations account executiveengineering doing well.  Children's father committed suicide 2019.  Mother colon cancer survivor.  Father deceased from lung and brain cancer.  Bought a camper this past year-enjoying.  ROS:  A ROS was performed and pertinent positives and negatives are included.  Exam:  Vitals:   02/18/18 1527  BP: 124/80  Weight: 184 lb (83.5 kg)  Height: 5\' 3"  (1.6 m)   Body mass index is 32.59 kg/m.   General appearance:  Normal Thyroid:  Symmetrical, normal in size, without palpable masses or nodularity. Respiratory  Auscultation:  Clear without wheezing or rhonchi Cardiovascular  Auscultation:  Regular rate, without rubs, murmurs or gallops  Edema/varicosities:  Not grossly evident Abdominal  Soft,nontender, without masses, guarding or rebound.  Liver/spleen:  No organomegaly noted  Hernia:  None appreciated  Skin  Inspection:  Grossly normal   Breasts: Examined lying and sitting.     Right: Without masses, retractions, discharge or axillary adenopathy.     Left: Without masses, retractions, discharge or axillary adenopathy. Gentitourinary   Inguinal/mons:  Normal without inguinal adenopathy  External genitalia:  Normal  BUS/Urethra/Skene's glands:  Normal  Vagina:  Normal  Cervix:  Normal  Uterus:  normal in size, shape and contour.  Midline and mobile  Adnexa/parametria:     Rt: Without masses or  tenderness.   Lt: Without masses or tenderness.  Anus and perineum: Normal  Digital rectal exam: Normal sphincter tone without palpated masses or tenderness  Assessment/Plan:  53 y.o. MWF G3 P2 for annual exam with no complaints.  Postmenopausal on no HRT with no bleeding Obesity History of anxiety/depression on no medication  Plan: SBEs, continue annual 3D screening mammogram, calcium rich foods, vitamin D 2000 daily encouraged.  Reviewed importance of regular weightbearing/balance exercise, yoga encouraged.  Aware of need to decrease simple carbs/calories.  Home safety, fall prevention discussed.  CBC, CMP, hemoglobin A1c, Pap normal 2019, new screening guidelines reviewed.  Instructed to come fasting next year we will check fasting lipid panel (states hard to return to office for blood work.)   Harrington Challengerancy J  [facility]uthWHNP, 4:09 PM 02/18/2018

## 2018-02-18 NOTE — Patient Instructions (Signed)
Health Maintenance for Postmenopausal Women Menopause is a normal process in which your reproductive ability comes to an end. This process happens gradually over a span of months to years, usually between the ages of 62 and 89. Menopause is complete when you have missed 12 consecutive menstrual periods. It is important to talk with your health care provider about some of the most common conditions that affect postmenopausal women, such as heart disease, cancer, and bone loss (osteoporosis). Adopting a healthy lifestyle and getting preventive care can help to promote your health and wellness. Those actions can also lower your chances of developing some of these common conditions. What should I know about menopause? During menopause, you may experience a number of symptoms, such as:  Moderate-to-severe hot flashes.  Night sweats.  Decrease in sex drive.  Mood swings.  Headaches.  Tiredness.  Irritability.  Memory problems.  Insomnia. Choosing to treat or not to treat menopausal changes is an individual decision that you make with your health care provider. What should I know about hormone replacement therapy and supplements? Hormone therapy products are effective for treating symptoms that are associated with menopause, such as hot flashes and night sweats. Hormone replacement carries certain risks, especially as you become older. If you are thinking about using estrogen or estrogen with progestin treatments, discuss the benefits and risks with your health care provider. What should I know about heart disease and stroke? Heart disease, heart attack, and stroke become more likely as you age. This may be due, in part, to the hormonal changes that your body experiences during menopause. These can affect how your body processes dietary fats, triglycerides, and cholesterol. Heart attack and stroke are both medical emergencies. There are many things that you can do to help prevent heart disease  and stroke:  Have your blood pressure checked at least every 1-2 years. High blood pressure causes heart disease and increases the risk of stroke.  If you are 79-72 years old, ask your health care provider if you should take aspirin to prevent a heart attack or a stroke.  Do not use any tobacco products, including cigarettes, chewing tobacco, or electronic cigarettes. If you need help quitting, ask your health care provider.  It is important to eat a healthy diet and maintain a healthy weight. ? Be sure to include plenty of vegetables, fruits, low-fat dairy products, and lean protein. ? Avoid eating foods that are high in solid fats, added sugars, or salt (sodium).  Get regular exercise. This is one of the most important things that you can do for your health. ? Try to exercise for at least 150 minutes each week. The type of exercise that you do should increase your heart rate and make you sweat. This is known as moderate-intensity exercise. ? Try to do strengthening exercises at least twice each week. Do these in addition to the moderate-intensity exercise.  Know your numbers.Ask your health care provider to check your cholesterol and your blood glucose. Continue to have your blood tested as directed by your health care provider.  What should I know about cancer screening? There are several types of cancer. Take the following steps to reduce your risk and to catch any cancer development as early as possible. Breast Cancer  Practice breast self-awareness. ? This means understanding how your breasts normally appear and feel. ? It also means doing regular breast self-exams. Let your health care provider know about any changes, no matter how small.  If you are 40 or  older, have a clinician do a breast exam (clinical breast exam or CBE) every year. Depending on your age, family history, and medical history, it may be recommended that you also have a yearly breast X-ray (mammogram).  If you  have a family history of breast cancer, talk with your health care provider about genetic screening.  If you are at high risk for breast cancer, talk with your health care provider about having an MRI and a mammogram every year.  Breast cancer (BRCA) gene test is recommended for women who have family members with BRCA-related cancers. Results of the assessment will determine the need for genetic counseling and BRCA1 and for BRCA2 testing. BRCA-related cancers include these types: ? Breast. This occurs in males or females. ? Ovarian. ? Tubal. This may also be called fallopian tube cancer. ? Cancer of the abdominal or pelvic lining (peritoneal cancer). ? Prostate. ? Pancreatic. Cervical, Uterine, and Ovarian Cancer Your health care provider may recommend that you be screened regularly for cancer of the pelvic organs. These include your ovaries, uterus, and vagina. This screening involves a pelvic exam, which includes checking for microscopic changes to the surface of your cervix (Pap test).  For women ages 21-65, health care providers may recommend a pelvic exam and a Pap test every three years. For women ages 39-65, they may recommend the Pap test and pelvic exam, combined with testing for human papilloma virus (HPV), every five years. Some types of HPV increase your risk of cervical cancer. Testing for HPV may also be done on women of any age who have unclear Pap test results.  Other health care providers may not recommend any screening for nonpregnant women who are considered low risk for pelvic cancer and have no symptoms. Ask your health care provider if a screening pelvic exam is right for you.  If you have had past treatment for cervical cancer or a condition that could lead to cancer, you need Pap tests and screening for cancer for at least 20 years after your treatment. If Pap tests have been discontinued for you, your risk factors (such as having a new sexual partner) need to be reassessed  to determine if you should start having screenings again. Some women have medical problems that increase the chance of getting cervical cancer. In these cases, your health care provider may recommend that you have screening and Pap tests more often.  If you have a family history of uterine cancer or ovarian cancer, talk with your health care provider about genetic screening.  If you have vaginal bleeding after reaching menopause, tell your health care provider.  There are currently no reliable tests available to screen for ovarian cancer. Lung Cancer Lung cancer screening is recommended for adults 57-50 years old who are at high risk for lung cancer because of a history of smoking. A yearly low-dose CT scan of the lungs is recommended if you:  Currently smoke.  Have a history of at least 30 pack-years of smoking and you currently smoke or have quit within the past 15 years. A pack-year is smoking an average of one pack of cigarettes per day for one year. Yearly screening should:  Continue until it has been 15 years since you quit.  Stop if you develop a health problem that would prevent you from having lung cancer treatment. Colorectal Cancer  This type of cancer can be detected and can often be prevented.  Routine colorectal cancer screening usually begins at age 12 and continues through  age 75.  If you have risk factors for colon cancer, your health care provider may recommend that you be screened at an earlier age.  If you have a family history of colorectal cancer, talk with your health care provider about genetic screening.  Your health care provider may also recommend using home test kits to check for hidden blood in your stool.  A small camera at the end of a tube can be used to examine your colon directly (sigmoidoscopy or colonoscopy). This is done to check for the earliest forms of colorectal cancer.  Direct examination of the colon should be repeated every 5-10 years until  age 75. However, if early forms of precancerous polyps or small growths are found or if you have a family history or genetic risk for colorectal cancer, you may need to be screened more often. Skin Cancer  Check your skin from head to toe regularly.  Monitor any moles. Be sure to tell your health care provider: ? About any new moles or changes in moles, especially if there is a change in a mole's shape or color. ? If you have a mole that is larger than the size of a pencil eraser.  If any of your family members has a history of skin cancer, especially at a  age, talk with your health care provider about genetic screening.  Always use sunscreen. Apply sunscreen liberally and repeatedly throughout the day.  Whenever you are outside, protect yourself by wearing long sleeves, pants, a wide-brimmed hat, and sunglasses. What should I know about osteoporosis? Osteoporosis is a condition in which bone destruction happens more quickly than new bone creation. After menopause, you may be at an increased risk for osteoporosis. To help prevent osteoporosis or the bone fractures that can happen because of osteoporosis, the following is recommended:  If you are 19-50 years old, get at least 1,000 mg of calcium and at least 600 mg of vitamin D per day.  If you are older than age 50 but er than age 70, get at least 1,200 mg of calcium and at least 600 mg of vitamin D per day.  If you are older than age 70, get at least 1,200 mg of calcium and at least 800 mg of vitamin D per day. Smoking and excessive alcohol intake increase the risk of osteoporosis. Eat foods that are rich in calcium and vitamin D, and do weight-bearing exercises several times each week as directed by your health care provider. What should I know about how menopause affects my mental health? Depression may occur at any age, but it is more common as you become older. Common symptoms of depression include:  Low or sad  mood.  Changes in sleep patterns.  Changes in appetite or eating patterns.  Feeling an overall lack of motivation or enjoyment of activities that you previously enjoyed.  Frequent crying spells. Talk with your health care provider if you think that you are experiencing depression. What should I know about immunizations? It is important that you get and maintain your immunizations. These include:  Tetanus, diphtheria, and pertussis (Tdap) booster vaccine.  Influenza every year before the flu season begins.  Pneumonia vaccine.  Shingles vaccine. Your health care provider may also recommend other immunizations. This information is not intended to replace advice given to you by your health care provider. Make sure you discuss any questions you have with your health care provider. Document Released: 03/08/2005 Document Revised: 08/04/2015 Document Reviewed: 10/18/2014 Elsevier Interactive Patient Education    2019 [place].

## 2018-02-19 LAB — COMPREHENSIVE METABOLIC PANEL
AG RATIO: 1.5 (calc) (ref 1.0–2.5)
ALT: 22 U/L (ref 6–29)
AST: 15 U/L (ref 10–35)
Albumin: 4.3 g/dL (ref 3.6–5.1)
Alkaline phosphatase (APISO): 89 U/L (ref 33–130)
BUN: 17 mg/dL (ref 7–25)
CALCIUM: 9.3 mg/dL (ref 8.6–10.4)
CO2: 26 mmol/L (ref 20–32)
Chloride: 105 mmol/L (ref 98–110)
Creat: 0.78 mg/dL (ref 0.50–1.05)
Globulin: 2.9 g/dL (calc) (ref 1.9–3.7)
Glucose, Bld: 98 mg/dL (ref 65–99)
Potassium: 3.7 mmol/L (ref 3.5–5.3)
Sodium: 143 mmol/L (ref 135–146)
Total Bilirubin: 0.6 mg/dL (ref 0.2–1.2)
Total Protein: 7.2 g/dL (ref 6.1–8.1)

## 2018-02-19 LAB — CBC WITH DIFFERENTIAL/PLATELET
ABSOLUTE MONOCYTES: 728 {cells}/uL (ref 200–950)
BASOS PCT: 0.6 %
Basophils Absolute: 58 cells/uL (ref 0–200)
EOS ABS: 233 {cells}/uL (ref 15–500)
Eosinophils Relative: 2.4 %
HCT: 39.5 % (ref 35.0–45.0)
Hemoglobin: 13.2 g/dL (ref 11.7–15.5)
Lymphs Abs: 2406 cells/uL (ref 850–3900)
MCH: 29.7 pg (ref 27.0–33.0)
MCHC: 33.4 g/dL (ref 32.0–36.0)
MCV: 88.8 fL (ref 80.0–100.0)
MPV: 11.1 fL (ref 7.5–12.5)
Monocytes Relative: 7.5 %
Neutro Abs: 6276 cells/uL (ref 1500–7800)
Neutrophils Relative %: 64.7 %
Platelets: 184 10*3/uL (ref 140–400)
RBC: 4.45 10*6/uL (ref 3.80–5.10)
RDW: 12.9 % (ref 11.0–15.0)
Total Lymphocyte: 24.8 %
WBC: 9.7 10*3/uL (ref 3.8–10.8)

## 2018-02-19 LAB — HEMOGLOBIN A1C
Hgb A1c MFr Bld: 5.8 % of total Hgb — ABNORMAL HIGH (ref <5.7)
Mean Plasma Glucose: 120 (calc)
eAG (mmol/L): 6.6 (calc)

## 2018-02-20 DIAGNOSIS — J019 Acute sinusitis, unspecified: Secondary | ICD-10-CM | POA: Diagnosis not present

## 2018-03-06 ENCOUNTER — Other Ambulatory Visit: Payer: Self-pay | Admitting: Women's Health

## 2018-03-06 DIAGNOSIS — Z1231 Encounter for screening mammogram for malignant neoplasm of breast: Secondary | ICD-10-CM

## 2018-04-20 ENCOUNTER — Ambulatory Visit: Payer: BLUE CROSS/BLUE SHIELD

## 2018-05-25 ENCOUNTER — Ambulatory Visit: Payer: BLUE CROSS/BLUE SHIELD

## 2018-07-03 DIAGNOSIS — R7303 Prediabetes: Secondary | ICD-10-CM | POA: Diagnosis not present

## 2018-07-03 DIAGNOSIS — R221 Localized swelling, mass and lump, neck: Secondary | ICD-10-CM | POA: Diagnosis not present

## 2018-07-03 DIAGNOSIS — K219 Gastro-esophageal reflux disease without esophagitis: Secondary | ICD-10-CM | POA: Diagnosis not present

## 2018-07-03 DIAGNOSIS — E041 Nontoxic single thyroid nodule: Secondary | ICD-10-CM | POA: Diagnosis not present

## 2018-07-14 ENCOUNTER — Other Ambulatory Visit: Payer: Self-pay

## 2018-07-14 ENCOUNTER — Ambulatory Visit
Admission: RE | Admit: 2018-07-14 | Discharge: 2018-07-14 | Disposition: A | Discharge status: Home / Self Care | Payer: BC Managed Care – PPO | Source: Ambulatory Visit | Attending: Women's Health | Admitting: Women's Health

## 2018-07-14 DIAGNOSIS — Z1231 Encounter for screening mammogram for malignant neoplasm of breast: Secondary | ICD-10-CM | POA: Diagnosis not present

## 2018-07-30 DIAGNOSIS — G4733 Obstructive sleep apnea (adult) (pediatric): Secondary | ICD-10-CM | POA: Diagnosis not present

## 2018-08-03 DIAGNOSIS — G4733 Obstructive sleep apnea (adult) (pediatric): Secondary | ICD-10-CM | POA: Diagnosis not present

## 2018-08-14 DIAGNOSIS — G4733 Obstructive sleep apnea (adult) (pediatric): Secondary | ICD-10-CM | POA: Diagnosis not present

## 2018-09-14 DIAGNOSIS — G4733 Obstructive sleep apnea (adult) (pediatric): Secondary | ICD-10-CM | POA: Diagnosis not present

## 2018-10-14 ENCOUNTER — Other Ambulatory Visit: Payer: Self-pay

## 2018-10-14 ENCOUNTER — Telehealth: Payer: Self-pay

## 2018-10-14 NOTE — Telephone Encounter (Signed)
Patient called c/o milky breast disharge and the nipple was painful yesterday. I recommended office visit for breast exam. She was in a store and declined me transferring her to appt desk stating she will call back when she is out of the store.

## 2018-10-15 ENCOUNTER — Ambulatory Visit: Payer: BC Managed Care – PPO | Admitting: Gynecology

## 2018-10-15 ENCOUNTER — Encounter: Payer: Self-pay | Admitting: Gynecology

## 2018-10-15 VITALS — BP 124/78

## 2018-10-15 DIAGNOSIS — N643 Galactorrhea not associated with childbirth: Secondary | ICD-10-CM | POA: Diagnosis not present

## 2018-10-15 DIAGNOSIS — G4733 Obstructive sleep apnea (adult) (pediatric): Secondary | ICD-10-CM | POA: Diagnosis not present

## 2018-10-15 NOTE — Progress Notes (Signed)
    Miranda Hill [birth year] [id number]        53 y.o.  [id number] presents noticing a milky discharge from her left nipple yesterday.  Had a little bit of tingling preceding this.  Symptoms have resolved since first noticing.  Mammogram 06/2018 normal  Past medical history,surgical history, problem list, medications, allergies, family history and social history were all reviewed and documented in the EPIC chart.  Directed ROS with pertinent positives and negatives documented in the history of present illness/assessment and plan.  Exam: Caryn Bee assistant Vitals:   10/15/18 1411  BP: 124/78   General appearance:  Normal Both breasts examined lying and sitting right without masses retractions discharge adenopathy.  Left with scant amount of milky discharge with squeezing.  No masses retractions adenopathy  Assessment/Plan:  53 y.o. [id number] with scant amount of galactorrhea left breast.  Exam otherwise normal.  Recent mammogram normal.  Recommend baseline prolactin.  If normal then plan expectant management.  If increases in amount or ever discolored/bloody patient knows to follow-up for reevaluation.    Anastasio Auerbach MD, 2:34 PM 10/15/2018

## 2018-10-15 NOTE — Patient Instructions (Signed)
Office will follow-up with you with the blood test results

## 2018-10-16 LAB — PROLACTIN: Prolactin: 5.3 ng/mL

## 2018-10-21 ENCOUNTER — Encounter: Payer: Self-pay | Admitting: Gynecology

## 2018-10-22 ENCOUNTER — Telehealth: Payer: Self-pay

## 2018-10-22 NOTE — Telephone Encounter (Signed)
Patient called for Prolactin result. Advised normal.  Per office note advised patient " If normal then plan expectant management.  If increases in amount or ever discolored/bloody patient knows to follow-up for reevaluation."

## 2018-11-03 DIAGNOSIS — G4733 Obstructive sleep apnea (adult) (pediatric): Secondary | ICD-10-CM | POA: Diagnosis not present

## 2018-11-14 DIAGNOSIS — G4733 Obstructive sleep apnea (adult) (pediatric): Secondary | ICD-10-CM | POA: Diagnosis not present

## 2018-12-15 DIAGNOSIS — G4733 Obstructive sleep apnea (adult) (pediatric): Secondary | ICD-10-CM | POA: Diagnosis not present

## 2019-01-09 DIAGNOSIS — T7805XA Anaphylactic reaction due to tree nuts and seeds, initial encounter: Secondary | ICD-10-CM | POA: Diagnosis not present

## 2019-01-14 DIAGNOSIS — G4733 Obstructive sleep apnea (adult) (pediatric): Secondary | ICD-10-CM | POA: Diagnosis not present

## 2019-02-14 DIAGNOSIS — G4733 Obstructive sleep apnea (adult) (pediatric): Secondary | ICD-10-CM | POA: Diagnosis not present

## 2019-02-19 ENCOUNTER — Other Ambulatory Visit: Payer: Self-pay

## 2019-02-22 ENCOUNTER — Ambulatory Visit: Payer: BC Managed Care – PPO | Admitting: Women's Health

## 2019-02-22 ENCOUNTER — Other Ambulatory Visit: Payer: Self-pay

## 2019-02-22 ENCOUNTER — Encounter: Payer: Self-pay | Admitting: Women's Health

## 2019-02-22 VITALS — BP 124/80 | Ht 63.0 in | Wt 192.0 lb

## 2019-02-22 DIAGNOSIS — Z01419 Encounter for gynecological examination (general) (routine) without abnormal findings: Secondary | ICD-10-CM

## 2019-02-22 NOTE — Patient Instructions (Signed)
Good to see you today! Sorry for the inconvience Health Maintenance for Postmenopausal Women Menopause is a normal process in which your ability to get pregnant comes to an end. This process happens slowly over many months or years, usually between the ages of 107 and 22. Menopause is complete when you have missed your menstrual periods for 12 months. It is important to talk with your health care provider about some of the most common conditions that affect women after menopause (postmenopausal women). These include heart disease, cancer, and bone loss (osteoporosis). Adopting a healthy lifestyle and getting preventive care can help to promote your health and wellness. The actions you take can also lower your chances of developing some of these common conditions. What should I know about menopause? During menopause, you may get a number of symptoms, such as:  Hot flashes. These can be moderate or severe.  Night sweats.  Decrease in sex drive.  Mood swings.  Headaches.  Tiredness.  Irritability.  Memory problems.  Insomnia. Choosing to treat or not to treat these symptoms is a decision that you make with your health care provider. Do I need hormone replacement therapy?  Hormone replacement therapy is effective in treating symptoms that are caused by menopause, such as hot flashes and night sweats.  Hormone replacement carries certain risks, especially as you become older. If you are thinking about using estrogen or estrogen with progestin, discuss the benefits and risks with your health care provider. What is my risk for heart disease and stroke? The risk of heart disease, heart attack, and stroke increases as you age. One of the causes may be a change in the body's hormones during menopause. This can affect how your body uses dietary fats, triglycerides, and cholesterol. Heart attack and stroke are medical emergencies. There are many things that you can do to help prevent heart disease  and stroke. Watch your blood pressure  High blood pressure causes heart disease and increases the risk of stroke. This is more likely to develop in people who have high blood pressure readings, are of African descent, or are overweight.  Have your blood pressure checked: ? Every 3-5 years if you are 63-8 years of age. ? Every year if you are 66 years old or older. Eat a healthy diet   Eat a diet that includes plenty of vegetables, fruits, low-fat dairy products, and lean protein.  Do not eat a lot of foods that are high in solid fats, added sugars, or sodium. Get regular exercise Get regular exercise. This is one of the most important things you can do for your health. Most adults should:  Try to exercise for at least 150 minutes each week. The exercise should increase your heart rate and make you sweat (moderate-intensity exercise).  Try to do strengthening exercises at least twice each week. Do these in addition to the moderate-intensity exercise.  Spend less time sitting. Even light physical activity can be beneficial. Other tips  Work with your health care provider to achieve or maintain a healthy weight.  Do not use any products that contain nicotine or tobacco, such as cigarettes, e-cigarettes, and chewing tobacco. If you need help quitting, ask your health care provider.  Know your numbers. Ask your health care provider to check your cholesterol and your blood sugar (glucose). Continue to have your blood tested as directed by your health care provider. Do I need screening for cancer? Depending on your health history and family history, you may need to have  cancer screening at different stages of your life. This may include screening for:  Breast cancer.  Cervical cancer.  Lung cancer.  Colorectal cancer. What is my risk for osteoporosis? After menopause, you may be at increased risk for osteoporosis. Osteoporosis is a condition in which bone destruction happens more  quickly than new bone creation. To help prevent osteoporosis or the bone fractures that can happen because of osteoporosis, you may take the following actions:  If you are 66-29 years old, get at least 1,000 mg of calcium and at least 600 mg of vitamin D per day.  If you are older than age 68 but younger than age 41, get at least 1,200 mg of calcium and at least 600 mg of vitamin D per day.  If you are older than age 59, get at least 1,200 mg of calcium and at least 800 mg of vitamin D per day. Smoking and drinking excessive alcohol increase the risk of osteoporosis. Eat foods that are rich in calcium and vitamin D, and do weight-bearing exercises several times each week as directed by your health care provider. How does menopause affect my mental health? Depression may occur at any age, but it is more common as you become older. Common symptoms of depression include:  Low or sad mood.  Changes in sleep patterns.  Changes in appetite or eating patterns.  Feeling an overall lack of motivation or enjoyment of activities that you previously enjoyed.  Frequent crying spells. Talk with your health care provider if you think that you are experiencing depression. General instructions See your health care provider for regular wellness exams and vaccines. This may include:  Scheduling regular health, dental, and eye exams.  Getting and maintaining your vaccines. These include: ? Influenza vaccine. Get this vaccine each year before the flu season begins. ? Pneumonia vaccine. ? Shingles vaccine. ? Tetanus, diphtheria, and pertussis (Tdap) booster vaccine. Your health care provider may also recommend other immunizations. Tell your health care provider if you have ever been abused or do not feel safe at home. Summary  Menopause is a normal process in which your ability to get pregnant comes to an end.  This condition causes hot flashes, night sweats, decreased interest in sex, mood swings,  headaches, or lack of sleep.  Treatment for this condition may include hormone replacement therapy.  Take actions to keep yourself healthy, including exercising regularly, eating a healthy diet, watching your weight, and checking your blood pressure and blood sugar levels.  Get screened for cancer and depression. Make sure that you are up to date with all your vaccines. This information is not intended to replace advice given to you by your health care provider. Make sure you discuss any questions you have with your health care provider. Document Revised: 01/07/2018 Document Reviewed: 01/07/2018 Elsevier Patient Education  2020 [employer].

## 2019-02-22 NOTE — Progress Notes (Signed)
Miranda Hill 12-29-1965 [id number]    History:    Presents for annual exam.  Postmenopausal on no HRT with no bleeding.  Normal Pap and mammogram history.  Bladder suspension greater than 20 years ago.  09/2018 scant amount of galactorrhea prolactin 5, none since.  May 31, 2015 benign colon polyps 5-year follow-up, mother colon cancer survivor.  Primary care manages anxiety and depression and labs.  History of GDM and slightly elevated hemoglobin A1c.  Past medical history, past surgical history, family history and social history were all reviewed and documented in the EPIC chart.  Preschool teacher.  Father deceased lung and brain cancer.  Oldest son diabetes and Asperger  doing well,  IT work,  younger son Art gallery manager.  Their father died of suicide 05/30/17.  ROS:  A ROS was performed and pertinent positives and negatives are included.  Exam:  Vitals:   02/22/19 1407  BP: 124/80  Weight: 192 lb (87.1 kg)  Height: 5\' 3"  (1.6 m)   Body mass index is 34.01 kg/m.   General appearance:  Normal Thyroid:  Symmetrical, normal in size, without palpable masses or nodularity. Respiratory  Auscultation:  Clear without wheezing or rhonchi Cardiovascular  Auscultation:  Regular rate, without rubs, murmurs or gallops  Edema/varicosities:  Not grossly evident Abdominal  Soft,nontender, without masses, guarding or rebound.  Liver/spleen:  No organomegaly noted  Hernia:  None appreciated  Skin  Inspection:  Grossly normal   Breasts: Examined lying and sitting.     Right: Without masses, retractions, discharge or axillary adenopathy.     Left: Without masses, retractions, discharge or axillary adenopathy. Gentitourinary   Inguinal/mons:  Normal without inguinal adenopathy  External genitalia:  Normal  BUS/Urethra/Skene's glands:  Normal  Vagina:  Normal  Cervix:  Normal  Uterus:  normal in size, shape and contour.  Midline and mobile  Adnexa/parametria:     Rt: Without masses or  tenderness.   Lt: Without masses or tenderness.  Anus and perineum: Normal  Digital rectal exam: Normal sphincter tone without palpated masses or tenderness  Assessment/Plan:  54 y.o. MWF G3 P2 for annual exam with no complaints.  Postmenopausal/no HRT/no bleeding 31-May-2015 benign colon polyps 5-year follow-up Anxiety/depression, thyroid nodules, GERD -slightly elevated hemoglobin A1c primary care managing  Plan: SBEs, continue annual 3D screening mammogram, calcium rich foods, vitamin D 2000 IUs daily encouraged.  Reviewed importance of increasing exercise and decreasing calories/carbs.  Self-care, leisure activities encouraged.  Struggling with weight and weight gain, weight watchers encouraged.  Pap normal 05-30-17, new screening guidelines reviewed.2020 [facility], 5:23 PM 02/22/2019

## 2019-03-17 DIAGNOSIS — G4733 Obstructive sleep apnea (adult) (pediatric): Secondary | ICD-10-CM | POA: Diagnosis not present

## 2019-04-06 DIAGNOSIS — D485 Neoplasm of uncertain behavior of skin: Secondary | ICD-10-CM | POA: Diagnosis not present

## 2019-04-06 DIAGNOSIS — L718 Other rosacea: Secondary | ICD-10-CM | POA: Diagnosis not present

## 2019-04-06 DIAGNOSIS — L821 Other seborrheic keratosis: Secondary | ICD-10-CM | POA: Diagnosis not present

## 2019-04-06 DIAGNOSIS — L439 Lichen planus, unspecified: Secondary | ICD-10-CM | POA: Diagnosis not present

## 2019-04-06 DIAGNOSIS — L812 Freckles: Secondary | ICD-10-CM | POA: Diagnosis not present

## 2019-04-14 DIAGNOSIS — G4733 Obstructive sleep apnea (adult) (pediatric): Secondary | ICD-10-CM | POA: Diagnosis not present

## 2019-06-02 ENCOUNTER — Other Ambulatory Visit: Payer: Self-pay | Admitting: Women's Health

## 2019-06-02 DIAGNOSIS — Z1231 Encounter for screening mammogram for malignant neoplasm of breast: Secondary | ICD-10-CM

## 2019-07-15 ENCOUNTER — Other Ambulatory Visit: Payer: Self-pay

## 2019-07-15 ENCOUNTER — Ambulatory Visit
Admission: RE | Admit: 2019-07-15 | Discharge: 2019-07-15 | Disposition: A | Discharge status: Home / Self Care | Payer: BC Managed Care – PPO | Source: Ambulatory Visit | Attending: *Deleted | Admitting: *Deleted

## 2019-07-15 DIAGNOSIS — Z1231 Encounter for screening mammogram for malignant neoplasm of breast: Secondary | ICD-10-CM | POA: Diagnosis not present

## 2019-11-30 DIAGNOSIS — Z20822 Contact with and (suspected) exposure to covid-19: Secondary | ICD-10-CM | POA: Diagnosis not present

## 2019-11-30 DIAGNOSIS — Z03818 Encounter for observation for suspected exposure to other biological agents ruled out: Secondary | ICD-10-CM | POA: Diagnosis not present

## 2019-12-28 IMAGING — MG DIGITAL SCREENING BILATERAL MAMMOGRAM WITH CAD
4 series · 4 of 4 positions shown · non-contrast
Comparison: Previous exam(s).

CLINICAL DATA: Screening.

EXAM:
DIGITAL SCREENING BILATERAL MAMMOGRAM WITH CAD

[R CC]
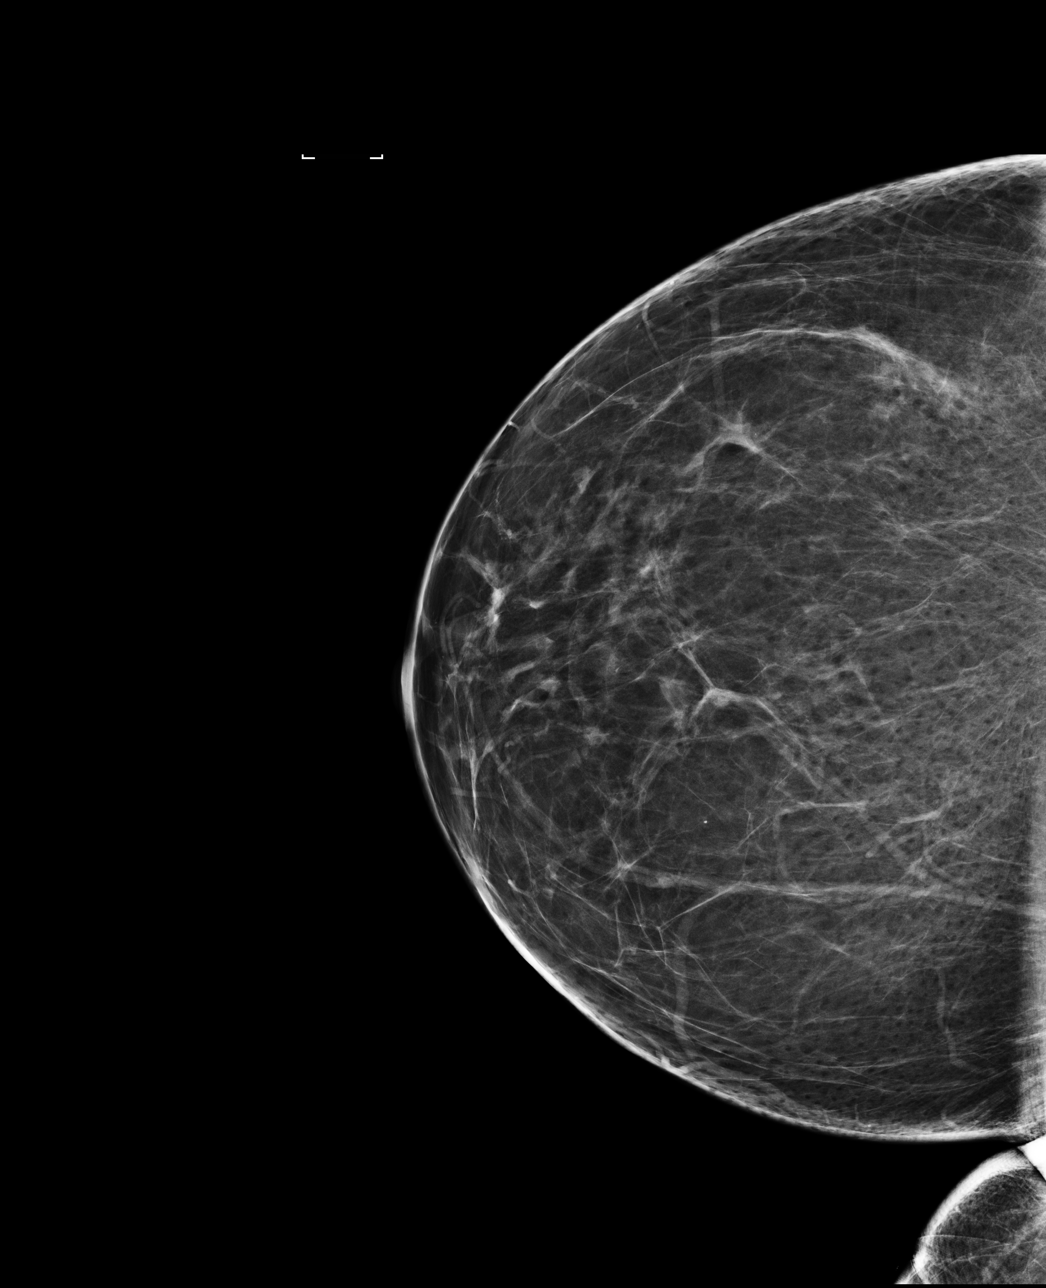

[R MLO]
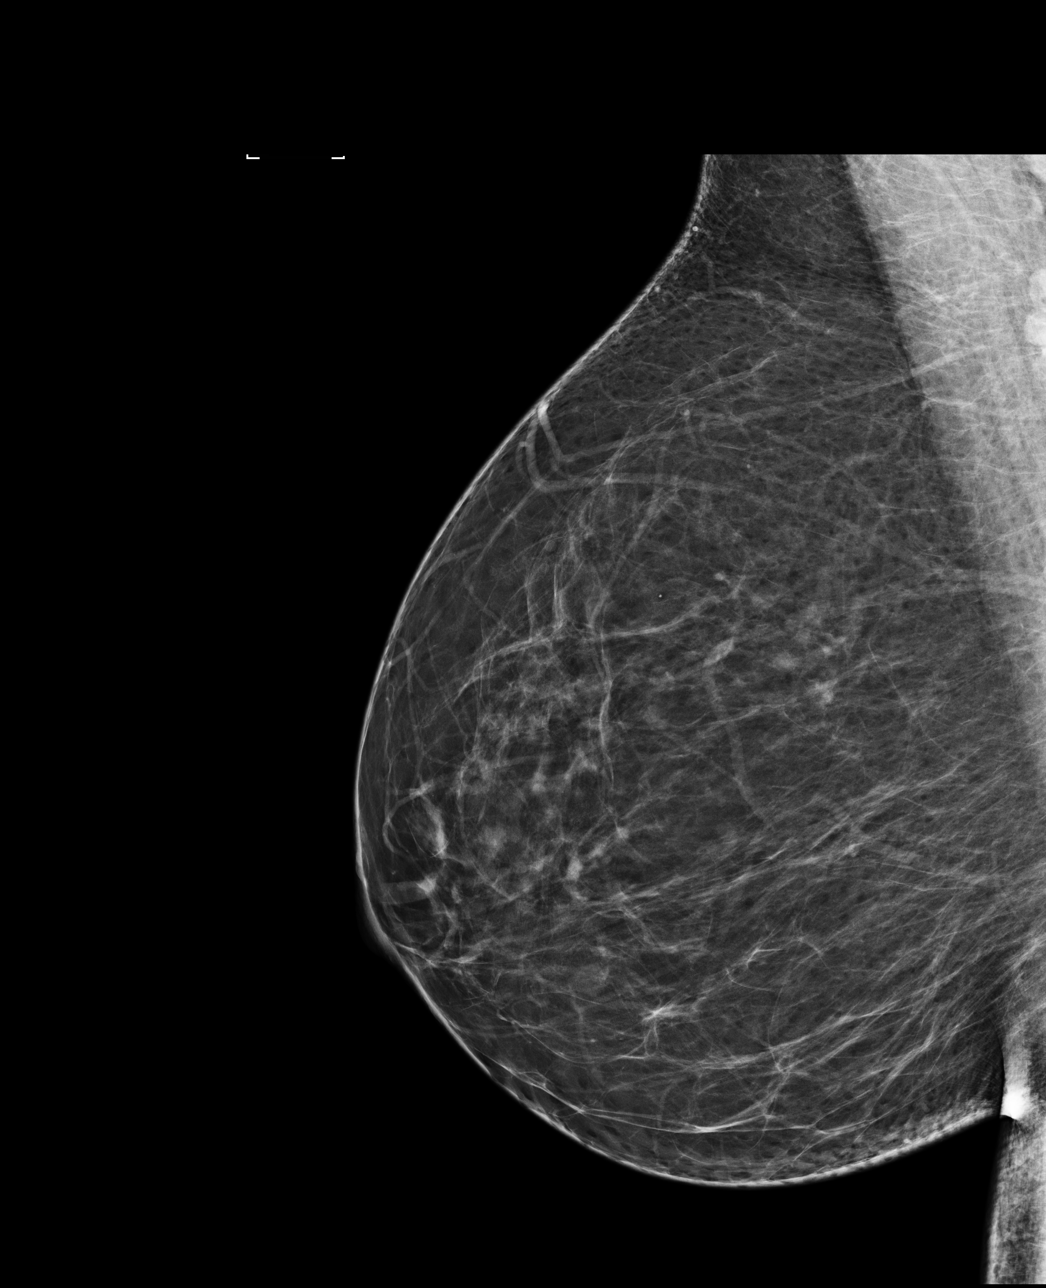

[L MLO]
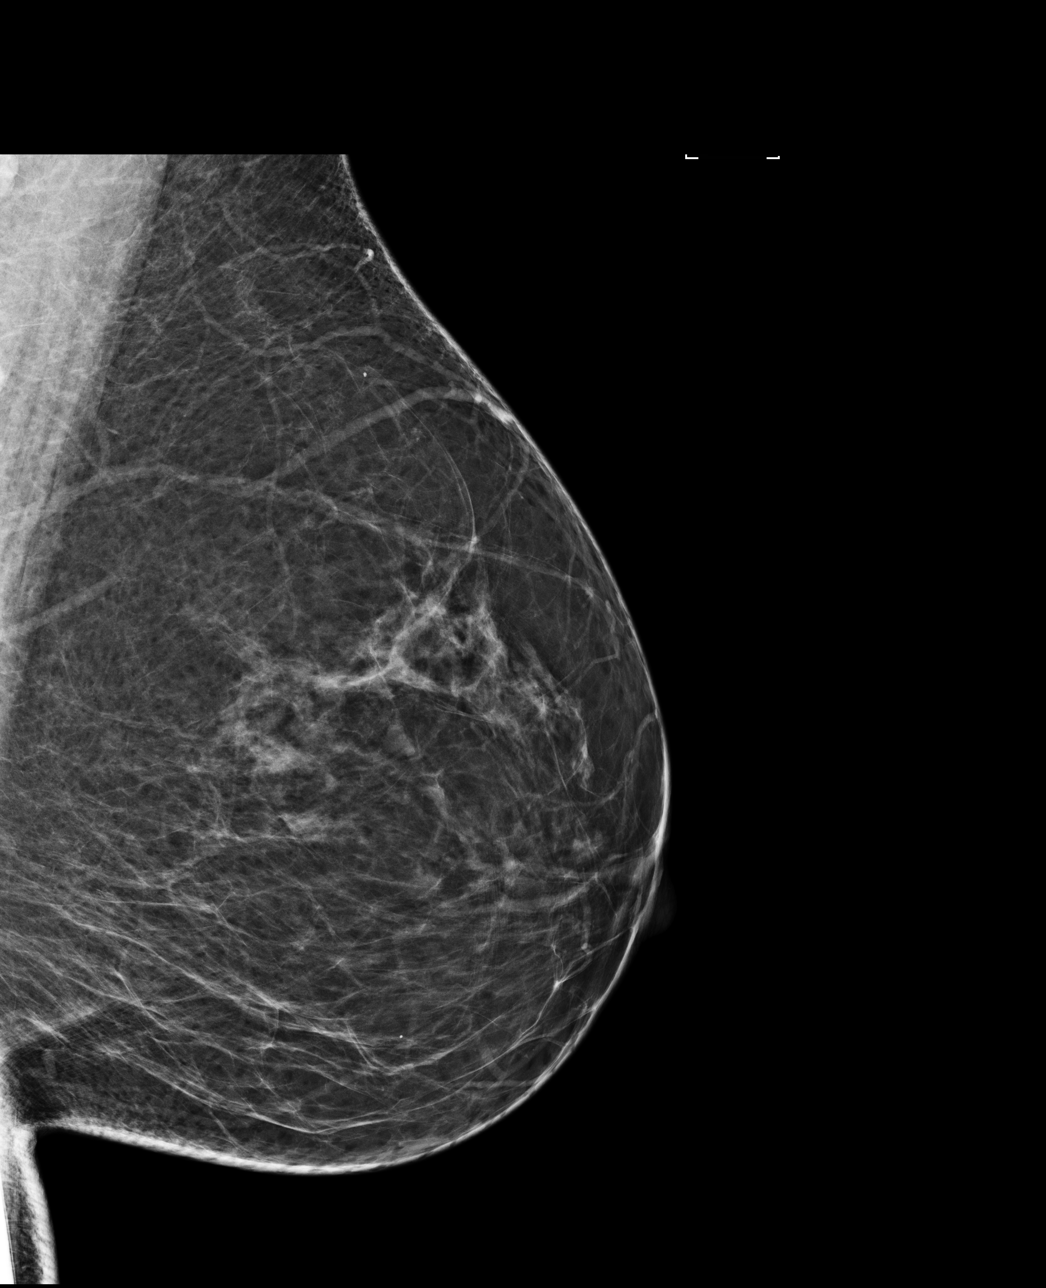

[L CC]
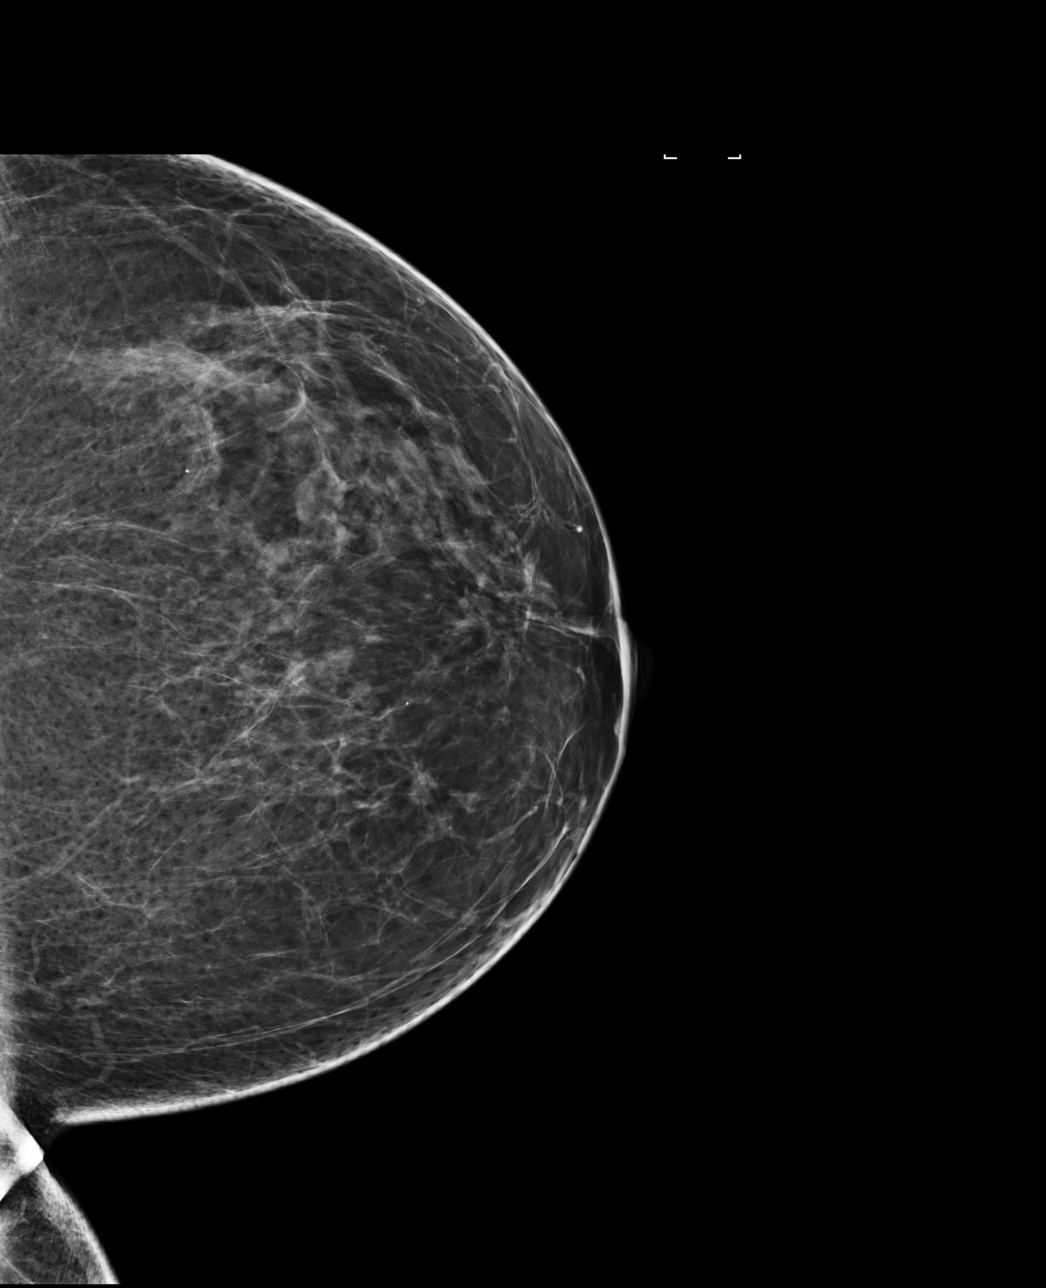

[4 of 4 positions shown; findings below may reference images not displayed]

ACR Breast Density Category b: There are scattered areas of
fibroglandular density.
FINDINGS: There are no findings suspicious for malignancy. Images were
processed with CAD.
IMPRESSION: No mammographic evidence of malignancy. A result letter of this
screening mammogram will be mailed directly to the patient.

RECOMMENDATION:
Screening mammogram in one year. (Code:AS-G-LCT)

BI-RADS CATEGORY  1: Negative.

## 2020-02-22 NOTE — Progress Notes (Signed)
55 y.o. [id number] Married White or Caucasian female here for annual exam.   Pre-school teacher    Feels like cervix has dropped with in the last year No problems with sex, no pain  No LMP recorded. Patient is postmenopausal.          Sexually active: Yes.    The current method of family planning is vasectomy.    Exercising: Yes.    has bowflex that she uses when she can stair-stepper (about 1 hour per week) Smoker:  no  Health Maintenance: Pap:  02-17-17 neg HPV HR neg History of abnormal Pap:  Yes (a long time ago) MMG:  07-17-2019 category b density birads 1:neg Colonoscopy: cant remember date- polyps BMD:  none TDaP: 2014 Gardasil:  none Covid-19: pfizer x2 and booster Hep C testing: none Screening Labs: Will return fasting   reports that she has never smoked. She has never used smokeless tobacco. She reports previous alcohol use. She reports that she does not use drugs.  Past Medical History:  Diagnosis Date  . History of chlamydia    1990  . Sleep apnea     Past Surgical History:  Procedure Laterality Date  . INTRAUTERINE DEVICE INSERTION  07/18/2008   MIRENA  . SALIVARY GLAND SURGERY     ABSCESS  . URETHRAL SLING  1999   WREN    Current Outpatient Medications  Medication Sig Dispense Refill  . Multiple Vitamin (MULTIVITAMIN) tablet Take 1 tablet by mouth daily.    Marland Kitchen loratadine (CLARITIN) 10 MG tablet Take 10 mg by mouth daily. (Patient not taking: Reported on 02/25/2020)     No current facility-administered medications for this visit.    Family History  Problem Relation Age of Onset  . Hypertension Mother   . Diabetes Mother   . Colon polyps Mother   . Colon cancer Mother   . Hypertension Father   . Cancer Father        lung and brain  . Diabetes Father   . Other Sister        hypoglycemic  . Colon polyps Sister   . Depression Brother   . Cancer Maternal Grandmother        UTERINE    Review of Systems  Constitutional: Negative.   HENT: Negative.    Eyes: Negative.   Respiratory: Negative.   Cardiovascular: Negative.   Gastrointestinal: Negative.   Endocrine: Negative.   Genitourinary: Negative.   Musculoskeletal: Negative.   Skin: Negative.     Exam:   BP 136/84   Pulse 96   Resp 20   Ht 5' 2.25" (1.581 m)   Wt 190 lb (86.2 kg)   BMI 34.47 kg/m   Height: 5' 2.25" (158.1 cm)  General appearance: alert, cooperative and appears stated age, no acute distress Head: Normocephalic, without obvious abnormality Neck: no adenopathy, thyroid enlarged Lungs: clear to auscultation bilaterally Breasts: Normal to palpation without dominant masses, Taught monthly breast self examination Heart: regular rate and rhythm Abdomen: soft, non-tender; no masses,  no organomegaly Extremities: extremities normal, no edema Skin: No rashes or lesions Lymph nodes: Cervical, supraclavicular, and axillary nodes normal. No abnormal inguinal nodes palpated Neurologic: Grossly normal   Pelvic: External genitalia:  no lesions              Urethra:  normal appearing urethra with no masses, tenderness or lesions              Bartholins and Skenes: normal  Vagina: normal appearing vagina, appropriate for age, normal appearing discharge, no lesions              Cervix: neg cervical motion tenderness, no visible lesions, cervical polyp             Bimanual Exam:   Uterus:  normal size, contour, position, consistency, mobility, non-tender              Adnexa: no mass, fullness, tenderness                 Kim, RN Chaperone was present for exam.  A:  Well woman exam  Cervical polyp  Uterine prolapse    P:   Pap :due 2024  Mammogram:due 06/2020  Labs:future orders placed  Medications: no new  Pt to return for polyp removal (future orders placed for routine labs, pt not fasting today)  Pt to continue monitor symptoms of prolapse, at this time does not desire intervention

## 2020-02-24 ENCOUNTER — Encounter: Payer: BC Managed Care – PPO | Admitting: Nurse Practitioner

## 2020-02-25 ENCOUNTER — Other Ambulatory Visit: Payer: Self-pay

## 2020-02-25 ENCOUNTER — Encounter: Payer: Self-pay | Admitting: Nurse Practitioner

## 2020-02-25 ENCOUNTER — Ambulatory Visit (INDEPENDENT_AMBULATORY_CARE_PROVIDER_SITE_OTHER): Payer: BC Managed Care – PPO | Admitting: Nurse Practitioner

## 2020-02-25 VITALS — BP 136/84 | HR 96 | Resp 20 | Ht 62.25 in | Wt 190.0 lb

## 2020-02-25 DIAGNOSIS — Z01419 Encounter for gynecological examination (general) (routine) without abnormal findings: Secondary | ICD-10-CM | POA: Diagnosis not present

## 2020-02-25 DIAGNOSIS — N841 Polyp of cervix uteri: Secondary | ICD-10-CM

## 2020-02-25 DIAGNOSIS — N814 Uterovaginal prolapse, unspecified: Secondary | ICD-10-CM

## 2020-02-25 NOTE — Patient Instructions (Addendum)
Preventing Unhealthy Weight Gain, Adult Staying at a healthy weight is important to your overall health. When fat builds up in your body, you may become overweight or obese. Being overweight or obese increases your risk of developing certain health problems, such as heart disease, diabetes, sleeping problems, joint problems, and some types of cancer. Unhealthy weight gain is often the result of making unhealthy food choices or not getting enough exercise. You can make changes to your lifestyle to prevent obesity and stay as healthy as possible. What nutrition changes can be made?  Eat only as much as your body needs. To do this: ? Pay attention to signs that you are hungry or full. Stop eating as soon as you feel full. ? If you feel hungry, try drinking water first before eating. Drink enough water so your urine is clear or pale yellow. ? Eat smaller portions. Pay attention to portion sizes when eating out. ? Look at serving sizes on food labels. Most foods contain more than one serving per container. ? Eat the recommended number of calories for your gender and activity level. For most active people, a daily total of 2,000 calories is appropriate. If you are trying to lose weight or are not very active, you may need to eat fewer calories. Talk with your health care provider or a diet and nutrition specialist (dietitian) about how many calories you need each day.  Choose healthy foods, such as: ? Fruits and vegetables. At each meal, try to fill at least half of your plate with fruits and vegetables. ? Whole grains, such as whole-wheat bread, brown rice, and quinoa. ? Lean meats, such as chicken or fish. ? Other healthy proteins, such as beans, eggs, or tofu. ? Healthy fats, such as nuts, seeds, fatty fish, and olive oil. ? Low-fat or fat-free dairy products.  Check food labels, and avoid food and drinks that: ? Are high in calories. ? Have added sugar. ? Are high in sodium. ? Have  saturated fats or trans fats.  Cook foods in healthier ways, such as by baking, broiling, or grilling.  Make a meal plan for the week, and shop with a grocery list to help you stay on track with your purchases. Try to avoid going to the grocery store when you are hungry.  When grocery shopping, try to shop around the outside of the store first, where the fresh foods are. Doing this helps you to avoid prepackaged foods, which can be high in sugar, salt (sodium), and fat.   What lifestyle changes can be made?  Exercise for 30 or more minutes on 5 or more days each week. Exercising may include brisk walking, yard work, biking, running, swimming, and team sports like basketball and soccer. Ask your health care provider which exercises are safe for you.  Do muscle-strengthening activities, such as lifting weights or using resistance bands, on 2 or more days a week.  Do not use any products that contain nicotine or tobacco, such as cigarettes and e-cigarettes. If you need help quitting, ask your health care provider.  Limit alcohol intake to no more than 1 drink a day for nonpregnant women and 2 drinks a day for men. One drink equals 12 oz of beer, 5 oz of wine, or 1 oz of hard liquor.  Try to get 7-9 hours of sleep each night.   What other changes can be made?  Keep a food and activity journal to keep track of: ? What you ate and  how many calories you had. Remember to count the calories in sauces, dressings, and side dishes. ? Whether you were active, and what exercises you did. ? Your calorie, weight, and activity goals.  Check your weight regularly. Track any changes. If you notice you have gained weight, make changes to your diet or activity routine.  Avoid taking weight-loss medicines or supplements. Talk to your health care provider before starting any new medicine or supplement.  Talk to your health care provider before trying any new diet or exercise plan. Why are these changes  important? Eating healthy, staying active, and having healthy habits can help you to prevent obesity. Those changes also:  Help you manage stress and emotions.  Help you connect with friends and family.  Improve your self-esteem.  Improve your sleep.  Prevent long-term health problems. What can happen if changes are not made? Being obese or overweight can cause you to develop joint or bone problems, which can make it hard for you to stay active or do activities you enjoy. Being obese or overweight also puts stress on your heart and lungs and can lead to health problems like diabetes, heart disease, and some cancers. Where to find more information Talk with your health care provider or a dietitian about healthy eating and healthy lifestyle choices. You may also find information from:  [place] Department of Agriculture, MyPlate: https://ball-collins.biz/  American Heart Association: www.heart.org  Centers for Disease Control and Prevention: FootballExhibition.com.br Summary  Staying at a healthy weight is important to your overall health. It helps you to prevent certain diseases and health problems, such as heart disease, diabetes, joint problems, sleep disorders, and some types of cancer.  Being obese or overweight can cause you to develop joint or bone problems, which can make it hard for you to stay active or do activities you enjoy.  You can prevent unhealthy weight gain by eating a healthy diet, exercising regularly, not smoking, limiting alcohol, and getting enough sleep.  Talk with your health care provider or a dietitian for guidance about healthy eating and healthy lifestyle choices. This information is not intended to replace advice given to you by your health care provider. Make sure you discuss any questions you have with your health care provider. Document Revised: 05/13/2019 Document Reviewed: 05/13/2019 Elsevier Patient Education  2021 Elsevier Inc.   Pelvic Organ Prolapse Pelvic organ  prolapse is a condition in women that involves the stretching, bulging, or dropping of pelvic organs into an abnormal position, past the opening of the vagina. It happens when the muscles and tissues that surround and support pelvic structures become weak or stretched. Pelvic organ prolapse can involve the:  Vagina (vaginal prolapse).  Uterus (uterine prolapse).  Bladder (cystocele).  Rectum (rectocele).  Intestines (enterocele). When organs other than the vagina are involved, they often bulge into the vagina or protrude from the vagina, depending on how severe the prolapse is. What are the causes? This condition may be caused by:  Pregnancy, labor, and childbirth.  Past pelvic surgery.  Lower levels of the hormone estrogen due to menopause.  Consistently lifting more than 50 lb (23 kg).  Obesity.  Long-term difficulty passing stool (chronic constipation).  Long-term, or chronic, cough.  Fluid buildup in the abdomen due to certain conditions. What are the signs or symptoms? Symptoms of this condition include:  Leaking a little urine (loss of bladder control) when you cough, sneeze, strain, and exercise (stress incontinence). This may be worse immediately after childbirth. It may gradually  improve over time.  Feeling pressure in your pelvis or vagina. This pressure may increase when you cough or when you are passing stool.  A bulge that protrudes from the opening of your vagina.  Difficulty passing urine or stool.  Pain in your lower back.  Pain or discomfort during sex, or decreased interest in sex.  Repeated bladder infections (urinary tract infections).  Difficulty inserting a tampon. In some people, this condition causes no symptoms. How is this diagnosed? This condition may be diagnosed based on a vaginal and rectal exam. During the exam, you may be asked to cough and strain while you are lying down, sitting, and standing up. Your health care provider will  determine if other tests are required, such as bladder function tests. How is this treated? Treatment for this condition may depend on your symptoms. Treatment may include:  Lifestyle changes, such as drinking plenty of fluids and eating foods that are high in fiber.  Emptying your bladder at scheduled times (bladder training therapy). This can help reduce or avoid urinary incontinence.  Estrogen. This may help mild prolapse by increasing the strength and tone of pelvic floor muscles.  Kegel exercises. These may help mild cases of prolapse by strengthening and tightening the muscles of the pelvic floor.  A soft, flexible device that helps support the vaginal walls and keep pelvic organs in place (pessary). This is inserted into your vagina by your health care provider.  Surgery. This is often the only form of treatment for severe prolapse. Follow these instructions at home: Eating and drinking  Avoid drinking beverages that contain caffeine or alcohol.  Increase your intake of high-fiber foods to decrease constipation and straining during bowel movements. Activity  Lose weight if recommended by your health care provider.  Avoid heavy lifting and straining with exercise and work. Do not hold your breath when you perform mild to moderate lifting and exercise activities. Limit your activities as directed by your health care provider.  Do Kegel exercises as directed by your health care provider. To do this: ? Squeeze your pelvic floor muscles tight. You should feel a tight lift in your rectal area and a tightness in your vaginal area. Keep your stomach, buttocks, and legs relaxed. ? Hold the muscles tight for up to 10 seconds. Then relax your muscles. ? Repeat this exercise 50 times a day, or as much as told by your health care provider. Continue to do this exercise for at least 4-6 weeks, or for as long as told by your health care provider. General instructions  Take over-the-counter  and prescription medicines only as told by your health care provider.  Wear a sanitary pad or adult diapers if you have urinary incontinence.  If you have a pessary, take care of it as told by your health care provider.  Keep all follow-up visits. This is important. Contact a health care provider if you:  Have symptoms that interfere with your daily activities or sex life.  Need medicine to help with the discomfort.  Notice bleeding from your vagina that is not related to your menstrual period.  Have a fever.  Have pain or bleeding when you urinate.  Have bleeding when you pass stool.  Pass urine when you have sex.  Have chronic constipation.  Have a pessary that falls out.  Have a foul-smelling vaginal discharge.  Have an unusual, low pain in your abdomen. Get help right away if you:  Cannot pass urine. Summary  Pelvic organ prolapse  is the stretching, bulging, or dropping of pelvic organs into an abnormal position. It happens when the muscles and tissues that surround and support pelvic structures become weak or stretched.  When organs other than the vagina are involved, they often bulge into the vagina or protrude from it, depending on how severe the prolapse is.  In most cases, this condition needs to be treated only if it produces symptoms. Treatment may include lifestyle changes, estrogen, Kegel exercises, pessary insertion, or surgery.  Avoid heavy lifting and straining with exercise and work. Do not hold your breath when you perform mild to moderate lifting and exercise activities. Limit your activities as directed by your health care provider. This information is not intended to replace advice given to you by your health care provider. Make sure you discuss any questions you have with your health care provider. Document Revised: 07/12/2019 Document Reviewed: 07/12/2019 Elsevier Patient Education  2021 Elsevier Inc.  Health Maintenance for Postmenopausal  Women Menopause is a normal process in which your ability to get pregnant comes to an end. This process happens slowly over many months or years, usually between the ages of 45 and 58. Menopause is complete when you have missed your menstrual periods for 12 months. It is important to talk with your health care provider about some of the most common conditions that affect women after menopause (postmenopausal women). These include heart disease, cancer, and bone loss (osteoporosis). Adopting a healthy lifestyle and getting preventive care can help to promote your health and wellness. The actions you take can also lower your chances of developing some of these common conditions. What should I know about menopause? During menopause, you may get a number of symptoms, such as:  Hot flashes. These can be moderate or severe.  Night sweats.  Decrease in sex drive.  Mood swings.  Headaches.  Tiredness.  Irritability.  Memory problems.  Insomnia. Choosing to treat or not to treat these symptoms is a decision that you make with your health care provider. Do I need hormone replacement therapy?  Hormone replacement therapy is effective in treating symptoms that are caused by menopause, such as hot flashes and night sweats.  Hormone replacement carries certain risks, especially as you become older. If you are thinking about using estrogen or estrogen with progestin, discuss the benefits and risks with your health care provider. What is my risk for heart disease and stroke? The risk of heart disease, heart attack, and stroke increases as you age. One of the causes may be a change in the body's hormones during menopause. This can affect how your body uses dietary fats, triglycerides, and cholesterol. Heart attack and stroke are medical emergencies. There are many things that you can do to help prevent heart disease and stroke. Watch your blood pressure  High blood pressure causes heart disease and  increases the risk of stroke. This is more likely to develop in people who have high blood pressure readings, are of African descent, or are overweight.  Have your blood pressure checked: ? Every 3-5 years if you are 69-56 years of age. ? Every year if you are 81 years old or older. Eat a healthy diet  Eat a diet that includes plenty of vegetables, fruits, low-fat dairy products, and lean protein.  Do not eat a lot of foods that are high in solid fats, added sugars, or sodium.   Get regular exercise Get regular exercise. This is one of the most important things you can do for  your health. Most adults should:  Try to exercise for at least 150 minutes each week. The exercise should increase your heart rate and make you sweat (moderate-intensity exercise).  Try to do strengthening exercises at least twice each week. Do these in addition to the moderate-intensity exercise.  Spend less time sitting. Even light physical activity can be beneficial. Other tips  Work with your health care provider to achieve or maintain a healthy weight.  Do not use any products that contain nicotine or tobacco, such as cigarettes, e-cigarettes, and chewing tobacco. If you need help quitting, ask your health care provider.  Know your numbers. Ask your health care provider to check your cholesterol and your blood sugar (glucose). Continue to have your blood tested as directed by your health care provider. Do I need screening for cancer? Depending on your health history and family history, you may need to have cancer screening at different stages of your life. This may include screening for:  Breast cancer.  Cervical cancer.  Lung cancer.  Colorectal cancer. What is my risk for osteoporosis? After menopause, you may be at increased risk for osteoporosis. Osteoporosis is a condition in which bone destruction happens more quickly than new bone creation. To help prevent osteoporosis or the bone fractures that  can happen because of osteoporosis, you may take the following actions:  If you are 7919-56 years old, get at least 1,000 mg of calcium and at least 600 mg of vitamin D per day.  If you are older than age 56 but younger than age 56, get at least 1,200 mg of calcium and at least 600 mg of vitamin D per day.  If you are older than age 56, get at least 1,200 mg of calcium and at least 800 mg of vitamin D per day. Smoking and drinking excessive alcohol increase the risk of osteoporosis. Eat foods that are rich in calcium and vitamin D, and do weight-bearing exercises several times each week as directed by your health care provider. How does menopause affect my mental health? Depression may occur at any age, but it is more common as you become older. Common symptoms of depression include:  Low or sad mood.  Changes in sleep patterns.  Changes in appetite or eating patterns.  Feeling an overall lack of motivation or enjoyment of activities that you previously enjoyed.  Frequent crying spells. Talk with your health care provider if you think that you are experiencing depression. General instructions See your health care provider for regular wellness exams and vaccines. This may include:  Scheduling regular health, dental, and eye exams.  Getting and maintaining your vaccines. These include: ? Influenza vaccine. Get this vaccine each year before the flu season begins. ? Pneumonia vaccine. ? Shingles vaccine. ? Tetanus, diphtheria, and pertussis (Tdap) booster vaccine. Your health care provider may also recommend other immunizations. Tell your health care provider if you have ever been abused or do not feel safe at home. Summary  Menopause is a normal process in which your ability to get pregnant comes to an end.  This condition causes hot flashes, night sweats, decreased interest in sex, mood swings, headaches, or lack of sleep.  Treatment for this condition may include hormone  replacement therapy.  Take actions to keep yourself healthy, including exercising regularly, eating a healthy diet, watching your weight, and checking your blood pressure and blood sugar levels.  Get screened for cancer and depression. Make sure that you are up to date with all your  vaccines. This information is not intended to replace advice given to you by your health care provider. Make sure you discuss any questions you have with your health care provider. Document Revised: 01/07/2018 Document Reviewed: 01/07/2018 Elsevier Patient Education  2021 [employer].

## 2020-02-28 NOTE — Progress Notes (Signed)
GYNECOLOGY  VISIT  CC:  Polyp removal  HPI: 55 y.o. G10P0012 Married White or Caucasian female here for polyp removal.     GYNECOLOGIC HISTORY: No LMP recorded. Patient is postmenopausal. Contraception: post menopausal Menopausal hormone therapy: none  Patient Active Problem List   Diagnosis Date Noted  . Multiple thyroid nodules 01/19/2014  . Family history of diabetes mellitus 09/18/2012  . Family history of colonic polyps 09/18/2012  . Mood swings 10/18/2011  . History of gestational diabetes 09/17/2011  . SUI (stress urinary incontinence, female) 09/17/2011  . Weight gain 09/17/2011  . PMDD (premenstrual dysphoric disorder) 09/17/2011    Past Medical History:  Diagnosis Date  . History of chlamydia    1990  . Sleep apnea     Past Surgical History:  Procedure Laterality Date  . INTRAUTERINE DEVICE INSERTION  07/18/2008   MIRENA  . SALIVARY GLAND SURGERY     ABSCESS  . URETHRAL SLING  1999   WREN    MEDS:   Current Outpatient Medications on File Prior to Visit  Medication Sig Dispense Refill  . loratadine (CLARITIN) 10 MG tablet Take 10 mg by mouth daily.    . Multiple Vitamin (MULTIVITAMIN) tablet Take 1 tablet by mouth daily.     No current facility-administered medications on file prior to visit.    ALLERGIES: Codeine and Tree extract  Family History  Problem Relation Age of Onset  . Hypertension Mother   . Diabetes Mother   . Colon polyps Mother   . Colon cancer Mother   . Hypertension Father   . Cancer Father        lung and brain  . Diabetes Father   . Other Sister        hypoglycemic  . Colon polyps Sister   . Depression Brother   . Cancer Maternal Grandmother        UTERINE     Review of Systems  Constitutional: Negative.   HENT: Negative.   Eyes: Negative.   Respiratory: Negative.   Cardiovascular: Negative.   Gastrointestinal: Negative.   Endocrine: Negative.   Genitourinary: Negative.   Musculoskeletal: Negative.   Skin:  Negative.   Allergic/Immunologic: Negative.   Neurological: Negative.   Hematological: Negative.   Psychiatric/Behavioral: Negative.     PHYSICAL EXAMINATION:    BP 120/82   Pulse 70   Resp 16   Wt 185 lb (83.9 kg)   BMI 33.57 kg/m     General appearance: alert, cooperative, no acute distress  Pelvic: External genitalia:  no lesions              Urethra:  normal appearing urethra with no masses, tenderness or lesions              Bartholins and Skenes: normal                 Vagina: small amount mildly malodorous discharge              Cervix: ~45mm size tissue clinically appears to be a polyp, see prodcedure note below             Cervical polyp removal procedure explained, consent signed Cervix prepped with betadine x 3 Attempted to attach ring forceps, and found that lesion was fused with cervix. Used biopsy forceps to remove as much as possible silver nitrate applied, hemostasis achieved.  Wet mount done r/t odor, but WNL  Chaperone, Joy, CMA, was present for exam.  Assessment: Suspected polyp  Plan: Cervical biopsy, will await pathology  Pt stated will get blood work from PCP, future orders that were placed at annual appointment removed.

## 2020-03-03 ENCOUNTER — Other Ambulatory Visit: Payer: Self-pay

## 2020-03-03 ENCOUNTER — Ambulatory Visit: Payer: BC Managed Care – PPO | Admitting: Nurse Practitioner

## 2020-03-03 ENCOUNTER — Other Ambulatory Visit (HOSPITAL_COMMUNITY)
Admission: RE | Admit: 2020-03-03 | Discharge: 2020-03-03 | Disposition: A | Discharge status: Home / Self Care | Payer: BC Managed Care – PPO | Source: Ambulatory Visit | Attending: Nurse Practitioner | Admitting: Nurse Practitioner

## 2020-03-03 ENCOUNTER — Encounter: Payer: Self-pay | Admitting: Nurse Practitioner

## 2020-03-03 VITALS — BP 120/82 | HR 70 | Resp 16 | Wt 185.0 lb

## 2020-03-03 DIAGNOSIS — N898 Other specified noninflammatory disorders of vagina: Secondary | ICD-10-CM | POA: Diagnosis not present

## 2020-03-03 DIAGNOSIS — N841 Polyp of cervix uteri: Secondary | ICD-10-CM | POA: Diagnosis not present

## 2020-03-03 LAB — WET PREP FOR TRICH, YEAST, CLUE

## 2020-03-03 NOTE — Progress Notes (Deleted)
GYNECOLOGY  VISIT   HPI: 55 y.o.   Married  {Race/ethnicity:17218}  female   [id number] with No LMP recorded. Patient is postmenopausal.   here for     GYNECOLOGIC HISTORY: No LMP recorded. Patient is postmenopausal. Contraception:  *** Menopausal hormone therapy:  *** Last mammogram:  *** Last pap smear:   ***        OB History    Gravida  3   Para  2   Term      Preterm      AB  1   Living  2     SAB  1   IAB      Ectopic      Multiple      Live Births                 Patient Active Problem List   Diagnosis Date Noted  . Multiple thyroid nodules 01/19/2014  . Family history of diabetes mellitus 09/18/2012  . Family history of colonic polyps 09/18/2012  . Mood swings 10/18/2011  . History of gestational diabetes 09/17/2011  . SUI (stress urinary incontinence, female) 09/17/2011  . Weight gain 09/17/2011  . PMDD (premenstrual dysphoric disorder) 09/17/2011    Past Medical History:  Diagnosis Date  . History of chlamydia    1990  . Sleep apnea     Past Surgical History:  Procedure Laterality Date  . INTRAUTERINE DEVICE INSERTION  07/18/2008   MIRENA  . SALIVARY GLAND SURGERY     ABSCESS  . URETHRAL SLING  1999   WREN    Current Outpatient Medications  Medication Sig Dispense Refill  . loratadine (CLARITIN) 10 MG tablet Take 10 mg by mouth daily. (Patient not taking: Reported on 02/25/2020)    . Multiple Vitamin (MULTIVITAMIN) tablet Take 1 tablet by mouth daily.     No current facility-administered medications for this visit.     ALLERGIES: Codeine and Tree extract  Family History  Problem Relation Age of Onset  . Hypertension Mother   . Diabetes Mother   . Colon polyps Mother   . Colon cancer Mother   . Hypertension Father   . Cancer Father        lung and brain  . Diabetes Father   . Other Sister        hypoglycemic  . Colon polyps Sister   . Depression Brother   . Cancer Maternal Grandmother        UTERINE    Social  History   Socioeconomic History  . Marital status: Married    Spouse name: Not on file  . Number of children: Not on file  . Years of education: Not on file  . Highest education level: Not on file  Occupational History  . Not on file  Tobacco Use  . Smoking status: Never Smoker  . Smokeless tobacco: Never Used  Vaping Use  . Vaping Use: Never used  Substance and Sexual Activity  . Alcohol use: Not Currently  . Drug use: No  . Sexual activity: Yes    Birth control/protection: Post-menopausal    Comment: husband with vasectomy  Other Topics Concern  . Not on file  Social History Narrative  . Not on file   Social Determinants of Health   Financial Resource Strain: Not on file  Food Insecurity: Not on file  Transportation Needs: Not on file  Physical Activity: Not on file  Stress: Not on file  Social Connections: Not on file  Intimate Partner Violence: Not on file    Review of Systems  PHYSICAL EXAMINATION:    There were no vitals taken for this visit.    General appearance: alert, cooperative and appears stated age Head: Normocephalic, without obvious abnormality, atraumatic Neck: no adenopathy, supple, symmetrical, trachea midline and thyroid normal to inspection and palpation Lungs: clear to auscultation bilaterally Breasts: normal appearance, no masses or tenderness, No nipple retraction or dimpling, No nipple discharge or bleeding, No axillary or supraclavicular adenopathy Heart: regular rate and rhythm Abdomen: soft, non-tender, no masses,  no organomegaly Extremities: extremities normal, atraumatic, no cyanosis or edema Skin: Skin color, texture, turgor normal. No rashes or lesions Lymph nodes: Cervical, supraclavicular, and axillary nodes normal. No abnormal inguinal nodes palpated Neurologic: Grossly normal  Pelvic: External genitalia:  no lesions              Urethra:  normal appearing urethra with no masses, tenderness or lesions              Bartholins  and Skenes: normal                 Vagina: normal appearing vagina with normal color and discharge, no lesions              Cervix: no lesions                Bimanual Exam:  Uterus:  normal size, contour, position, consistency, mobility, non-tender              Adnexa: no mass, fullness, tenderness              Rectal exam: {yes no:314532}.  Confirms.              Anus:  normal sphincter tone, no lesions  Chaperone was present for exam.  ASSESSMENT     PLAN     An After Visit Summary was printed and given to the patient.  ______ minutes face to face time of which over 50% was spent in counseling.

## 2020-03-06 LAB — SURGICAL PATHOLOGY

## 2020-05-31 ENCOUNTER — Other Ambulatory Visit: Payer: Self-pay | Admitting: Internal Medicine

## 2020-05-31 DIAGNOSIS — Z1231 Encounter for screening mammogram for malignant neoplasm of breast: Secondary | ICD-10-CM

## 2020-07-24 ENCOUNTER — Ambulatory Visit
Admission: RE | Admit: 2020-07-24 | Discharge: 2020-07-24 | Disposition: A | Discharge status: Home / Self Care | Payer: BC Managed Care – PPO | Source: Ambulatory Visit

## 2020-07-24 ENCOUNTER — Other Ambulatory Visit: Payer: Self-pay

## 2020-07-24 DIAGNOSIS — Z1231 Encounter for screening mammogram for malignant neoplasm of breast: Secondary | ICD-10-CM | POA: Diagnosis not present

## 2020-08-28 ENCOUNTER — Ambulatory Visit: Payer: BC Managed Care – PPO | Admitting: Obstetrics and Gynecology

## 2020-08-28 ENCOUNTER — Other Ambulatory Visit: Payer: Self-pay

## 2020-08-28 ENCOUNTER — Encounter: Payer: Self-pay | Admitting: Obstetrics and Gynecology

## 2020-08-28 VITALS — BP 130/74 | HR 97 | Ht 62.25 in | Wt 183.0 lb

## 2020-08-28 DIAGNOSIS — J329 Chronic sinusitis, unspecified: Secondary | ICD-10-CM | POA: Insufficient documentation

## 2020-08-28 DIAGNOSIS — G4733 Obstructive sleep apnea (adult) (pediatric): Secondary | ICD-10-CM | POA: Insufficient documentation

## 2020-08-28 DIAGNOSIS — L509 Urticaria, unspecified: Secondary | ICD-10-CM | POA: Insufficient documentation

## 2020-08-28 DIAGNOSIS — N3946 Mixed incontinence: Secondary | ICD-10-CM | POA: Diagnosis not present

## 2020-08-28 DIAGNOSIS — E041 Nontoxic single thyroid nodule: Secondary | ICD-10-CM | POA: Insufficient documentation

## 2020-08-28 DIAGNOSIS — N816 Rectocele: Secondary | ICD-10-CM

## 2020-08-28 DIAGNOSIS — N814 Uterovaginal prolapse, unspecified: Secondary | ICD-10-CM

## 2020-08-28 DIAGNOSIS — G5600 Carpal tunnel syndrome, unspecified upper limb: Secondary | ICD-10-CM | POA: Insufficient documentation

## 2020-08-28 DIAGNOSIS — K219 Gastro-esophageal reflux disease without esophagitis: Secondary | ICD-10-CM | POA: Insufficient documentation
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
## 2020-08-28 NOTE — Patient Instructions (Signed)
Kegel Exercises  Kegel exercises can help strengthen your pelvic floor muscles. The pelvic floor is a group of muscles that support your rectum, small intestine, and bladder. In females, pelvic floor muscles also help support the womb (uterus). These muscles help you control the flow of urine and stool. Kegel exercises are painless and simple, and they do not require any equipment. Your provider may suggest Kegel exercises to: Improve bladder and bowel control. Improve sexual response. Improve weak pelvic floor muscles after surgery to remove the uterus (hysterectomy) or pregnancy (females). Improve weak pelvic floor muscles after prostate gland removal or surgery (males). Kegel exercises involve squeezing your pelvic floor muscles, which are the same muscles you squeeze when you try to stop the flow of urine or keep from passing gas. The exercises can be done while sitting, standing, or lying down, but itis best to vary your position. Exercises How to do Kegel exercises: Squeeze your pelvic floor muscles tight. You should feel a tight lift in your rectal area. If you are a female, you should also feel a tightness in your vaginal area. Keep your stomach, buttocks, and legs relaxed. Hold the muscles tight for up to 10 seconds. Breathe normally. Relax your muscles. Repeat as told by your health care provider. Repeat this exercise daily as told by your health care provider. Continue to do this exercise for at least 4-6 weeks, or for as long as told by your healthcare provider. You may be referred to a physical therapist who can help you learn more abouthow to do Kegel exercises. Depending on your condition, your health care provider may recommend: Varying how long you squeeze your muscles. Doing several sets of exercises every day. Doing exercises for several weeks. Making Kegel exercises a part of your regular exercise routine. This information is not intended to replace advice given to you by  your health care provider. Make sure you discuss any questions you have with your healthcare provider. Document Revised: 01/05/2020 Document Reviewed: 09/03/2017 Elsevier Patient Education  2022 [employer]  

## 2020-08-29 ENCOUNTER — Encounter: Payer: Self-pay | Admitting: Obstetrics and Gynecology

## 2020-08-29 ENCOUNTER — Ambulatory Visit: Payer: BC Managed Care – PPO | Admitting: Obstetrics and Gynecology

## 2020-11-13 DIAGNOSIS — R350 Frequency of micturition: Secondary | ICD-10-CM | POA: Diagnosis not present

## 2020-11-13 DIAGNOSIS — R8281 Pyuria: Secondary | ICD-10-CM | POA: Diagnosis not present

## 2020-11-13 DIAGNOSIS — N390 Urinary tract infection, site not specified: Secondary | ICD-10-CM | POA: Diagnosis not present

## 2020-11-13 DIAGNOSIS — R319 Hematuria, unspecified: Secondary | ICD-10-CM | POA: Diagnosis not present

## 2021-01-16 DIAGNOSIS — L600 Ingrowing nail: Secondary | ICD-10-CM | POA: Diagnosis not present

## 2021-01-16 DIAGNOSIS — J069 Acute upper respiratory infection, unspecified: Secondary | ICD-10-CM | POA: Diagnosis not present

## 2021-01-23 ENCOUNTER — Encounter: Payer: Self-pay | Admitting: Obstetrics and Gynecology

## 2021-01-24 ENCOUNTER — Encounter: Payer: Self-pay | Admitting: Obstetrics and Gynecology

## 2021-01-24 ENCOUNTER — Ambulatory Visit: Payer: BC Managed Care – PPO | Admitting: Obstetrics and Gynecology

## 2021-01-24 ENCOUNTER — Other Ambulatory Visit: Payer: Self-pay

## 2021-01-24 VITALS — BP 150/80 | HR 76 | Wt 188.0 lb

## 2021-01-24 DIAGNOSIS — N814 Uterovaginal prolapse, unspecified: Secondary | ICD-10-CM

## 2021-01-24 DIAGNOSIS — N3090 Cystitis, unspecified without hematuria: Secondary | ICD-10-CM

## 2021-01-24 DIAGNOSIS — F419 Anxiety disorder, unspecified: Secondary | ICD-10-CM

## 2021-01-24 MED ORDER — SULFAMETHOXAZOLE-TRIMETHOPRIM 800-160 MG PO TABS: 1.0000 | ORAL_TABLET | Freq: Two times a day (BID) | ORAL | 0 refills | Status: DC

## 2021-01-24 NOTE — Progress Notes (Signed)
GYNECOLOGY  VISIT   HPI: 55 y.o.   Married White or Caucasian Not Hispanic or Latino  female   [phone] with No LMP recorded. Patient is postmenopausal.   here for urinary pressure and frequency and dysuria.  She c/o a 1-2 week h/o urinary frequency, bladder pressure, yesterday she started having dysuria. She took azo last night.  No fever, no flank pain.  She has a h/o a grade 2 uterine prolapse and a small grade 2 rectocele. C/o pelvic pressure, no increase in vaginal pressure. No vaginitis symptoms.  She is under lots of stress. She is worried about her health, worried the bladder infection may be a sign of DM, worried about her son (just engaged, partner pregnant), working too hard, husband is a Recruitment consultant.   GYNECOLOGIC HISTORY: No LMP recorded. Patient is postmenopausal. Contraception:pmp  Menopausal hormone therapy: none         OB History     Gravida  3   Para  2   Term      Preterm      AB  1   Living  2      SAB  1   IAB      Ectopic      Multiple      Live Births                 Patient Active Problem List   Diagnosis Date Noted   Urticaria, unspecified 08/28/2020   Carpal tunnel syndrome 08/28/2020   Chronic sinusitis 08/28/2020   Gastroesophageal reflux disease without esophagitis 08/28/2020   Obstructive sleep apnea syndrome 08/28/2020   Thyroid nodule 08/28/2020   Multiple thyroid nodules 01/19/2014   Family history of diabetes mellitus 09/18/2012   Family history of colonic polyps 09/18/2012   Mood swings 10/18/2011   History of gestational diabetes 09/17/2011   SUI (stress urinary incontinence, female) 09/17/2011   Weight gain 09/17/2011   PMDD (premenstrual dysphoric disorder) 09/17/2011    Past Medical History:  Diagnosis Date   History of chlamydia    1990   Sleep apnea     Past Surgical History:  Procedure Laterality Date   INTRAUTERINE DEVICE INSERTION  07/18/2008   MIRENA   SALIVARY GLAND SURGERY     ABSCESS    URETHRAL SLING  1999   WREN    Current Outpatient Medications  Medication Sig Dispense Refill   loratadine (CLARITIN) 10 MG tablet Take 10 mg by mouth daily.     Multiple Vitamin (MULTIVITAMIN) tablet Take 1 tablet by mouth daily.     No current facility-administered medications for this visit.     ALLERGIES: Codeine and Tree extract  Family History  Problem Relation Age of Onset   Hypertension Mother    Diabetes Mother    Colon polyps Mother    Colon cancer Mother    Hypertension Father    Cancer Father        lung and brain   Diabetes Father    Other Sister        hypoglycemic   Colon polyps Sister    Depression Brother    Cancer Maternal Grandmother        UTERINE    Social History   Socioeconomic History   Marital status: Married    Spouse name: Not on file   Number of children: Not on file   Years of education: Not on file   Highest education level: Not on file  Occupational History  Not on file  Tobacco Use   Smoking status: Never   Smokeless tobacco: Never  Vaping Use   Vaping Use: Never used  Substance and Sexual Activity   Alcohol use: Not Currently   Drug use: No   Sexual activity: Yes    Birth control/protection: Post-menopausal    Comment: husband with vasectomy  Other Topics Concern   Not on file  Social History Narrative   Not on file   Social Determinants of Health   Financial Resource Strain: Not on file  Food Insecurity: Not on file  Transportation Needs: Not on file  Physical Activity: Not on file  Stress: Not on file  Social Connections: Not on file  Intimate Partner Violence: Not on file    Review of Systems  Genitourinary:  Positive for dysuria, frequency and urgency.  All other systems reviewed and are negative.  PHYSICAL EXAMINATION:    BP (!) 150/80    Pulse 76    Wt 188 lb (85.3 kg)    SpO2 99%    BMI 34.11 kg/m     General appearance: alert, cooperative and appears stated age Abdomen: soft, non-tender; non  distended, no masses,  no organomegaly  Pelvic: External genitalia:  no lesions              Urethra:  normal appearing urethra with no masses, tenderness or lesions              Bartholins and Skenes: normal                 Vagina: normal appearing vagina with normal color and discharge, no lesions. Grade 2 uterine prolapse, small grade 2 rectocele              Cervix: no lesions              Bimanual Exam:  Uterus:  normal size, contour, position, consistency, mobility, non-tender              Adnexa: no mass, fullness, tenderness                Chaperone was present for exam.  1. Cystitis - Urinalysis,Complete w/RFL Culture - sulfamethoxazole-trimethoprim (BACTRIM DS) 800-160 MG tablet; Take 1 tablet by mouth 2 (two) times daily. One PO BID x 3 days  Dispense: 6 tablet; Refill: 0 -Declines pyridium, she will take AZO  2. Anxiety Discussed exercise, eating healthy -She will let me know if she wants the name of a counselor -Declines medication, tried zoloft in the past and it didn't work -Information on managing anxiety given  3. Uterine prolapse Stable.

## 2021-01-24 NOTE — Patient Instructions (Addendum)
Managing Anxiety, Adult After being diagnosed with anxiety, you may be relieved to know why you have felt or behaved a certain way. You may also feel overwhelmed about the treatment ahead and what it will mean for your life. With care and support, you can manage this condition. How to manage lifestyle changes Managing stress and anxiety Stress is your body's reaction to life changes and events, both good and bad. Most stress will last just a few hours, but stress can be ongoing and can lead to more than just stress. Although stress can play a major role in anxiety, it is not the same as anxiety. Stress is usually caused by something external, such as a deadline, test, or competition. Stress normally passes after the triggering event has ended.  Anxiety is caused by something internal, such as imagining a terrible outcome or worrying that something will go wrong that will devastate you. Anxiety often does not go away even after the triggering event is over, and it can become long-term (chronic) worry. It is important to understand the differences between stress and anxiety and to manage your stress effectively so that it does not lead to an anxious response. Talk with your health care provider or a counselor to learn more about reducing anxiety and stress. He or she may suggest tension reduction techniques, such as: Music therapy. Spend time creating or listening to music that you enjoy and that inspires you. Mindfulness-based meditation. Practice being aware of your normal breaths while not trying to control your breathing. It can be done while sitting or walking. Centering prayer. This involves focusing on a word, phrase, or sacred image that means something to you and brings you peace. Deep breathing. To do this, expand your stomach and inhale slowly through your nose. Hold your breath for 3-5 seconds. Then exhale slowly, letting your stomach muscles relax. Self-talk. Learn to notice and identify  thought patterns that lead to anxiety reactions and change those patterns to thoughts that feel peaceful. Muscle relaxation. Taking time to tense muscles and then relax them. Choose a tension reduction technique that fits your lifestyle and personality. These techniques take time and practice. Set aside 5-15 minutes a day to do them. Therapists can offer counseling and training in these techniques. The training to help with anxiety may be covered by some insurance plans. Other things you can do to manage stress and anxiety include: Keeping a stress diary. This can help you learn what triggers your reaction and then learn ways to manage your response. Thinking about how you react to certain situations. You may not be able to control everything, but you can control your response. Making time for activities that help you relax and not feeling guilty about spending your time in this way. Doing visual imagery. This involves imagining or creating mental pictures to help you relax. Practicing yoga. Through yoga poses, you can lower tension and promote relaxation.  Medicines Medicines can help ease symptoms. Medicines for anxiety include: Antidepressant medicines. These are usually prescribed for long-term daily control. Anti-anxiety medicines. These may be added in severe cases, especially when panic attacks occur. Medicines will be prescribed by a health care provider. When used together, medicines, psychotherapy, and tension reduction techniques may be the most effective treatment. Relationships Relationships can play a big part in helping you recover. Try to spend more time connecting with trusted friends and family members. Consider going to couples counseling if you have a partner, taking family education classes, or going to family  therapy. Therapy can help you and others better understand your condition. How to recognize changes in your anxiety Everyone responds differently to treatment for  anxiety. Recovery from anxiety happens when symptoms decrease and stop interfering with your daily activities at home or work. This may mean that you will start to: Have better concentration and focus. Worry will interfere less in your daily thinking. Sleep better. Be less irritable. Have more energy. Have improved memory. It is also important to recognize when your condition is getting worse. Contact your health care provider if your symptoms interfere with home or work and you feel like your condition is not improving. Follow these instructions at home: Activity Exercise. Adults should do the following: Exercise for at least 150 minutes each week. The exercise should increase your heart rate and make you sweat (moderate-intensity exercise). Strengthening exercises at least twice a week. Get the right amount and quality of sleep. Most adults need 7-9 hours of sleep each night. Lifestyle  Eat a healthy diet that includes plenty of vegetables, fruits, whole grains, low-fat dairy products, and lean protein. Do not eat a lot of foods that are high in fats, added sugars, or salt (sodium). Make choices that simplify your life. Do not use any products that contain nicotine or tobacco. These products include cigarettes, chewing tobacco, and vaping devices, such as e-cigarettes. If you need help quitting, ask your health care provider. Avoid caffeine, alcohol, and certain over-the-counter cold medicines. These may make you feel worse. Ask your pharmacist which medicines to avoid. General instructions Take over-the-counter and prescription medicines only as told by your health care provider. Keep all follow-up visits. This is important. Where to find support You can get help and support from these sources: Self-help groups. Online and [employer]. A trusted spiritual leader. Couples counseling. Family education classes. Family therapy. Where to find more information You may find  that joining a support group helps you deal with your anxiety. The following sources can help you locate counselors or support groups near you: [place]: www.mentalhealthamerica.net Anxiety and Depression Association of [place] (ADAA): https://www.clark.net/ National Alliance on Mental Illness (NAMI): www.nami.org Contact a health care provider if: You have a hard time staying focused or finishing daily tasks. You spend many hours a day feeling worried about everyday life. You become exhausted by worry. You start to have headaches or frequently feel tense. You develop chronic nausea or diarrhea. Get help right away if: You have a racing heart and shortness of breath. You have thoughts of hurting yourself or others. If you ever feel like you may hurt yourself or others, or have thoughts about taking your own life, get help right away. Go to your nearest emergency department or: Call your local emergency services (911 in the [place] Call a suicide crisis helpline, such as the [place] at (567)351-2375 or 988 in the [place]. This is open 24 hours a day in the [place] Text the Crisis Text Line at 208-517-4183 (in the [place] Summary Taking steps to learn and use tension reduction techniques can help calm you and help prevent triggering an anxiety reaction. When used together, medicines, psychotherapy, and tension reduction techniques may be the most effective treatment. Family, friends, and partners can play a big part in supporting you. This information is not intended to replace advice given to you by your health care provider. Make sure you discuss any questions you have with your health care provider. Document Revised: 08/09/2020 Document Reviewed: 05/07/2020 Elsevier Patient  Education  2022 [employer]. Urinary Tract Infection, Adult A urinary tract infection (UTI) is an infection of any part of the urinary tract. The urinary tract includes the kidneys, ureters, bladder,  and urethra. These organs make, store, and get rid of urine in the body. An upper UTI affects the ureters and kidneys. A lower UTI affects the bladder and urethra. What are the causes? Most urinary tract infections are caused by bacteria in your genital area around your urethra, where urine leaves your body. These bacteria grow and cause inflammation of your urinary tract. What increases the risk? You are more likely to develop this condition if: You have a urinary catheter that stays in place. You are not able to control when you urinate or have a bowel movement (incontinence). You are female and you: Use a spermicide or diaphragm for birth control. Have low estrogen levels. Are pregnant. You have certain genes that increase your risk. You are sexually active. You take antibiotic medicines. You have a condition that causes your flow of urine to slow down, such as: An enlarged prostate, if you are female. Blockage in your urethra. A kidney stone. A nerve condition that affects your bladder control (neurogenic bladder). Not getting enough to drink, or not urinating often. You have certain medical conditions, such as: Diabetes. A weak disease-fighting system (immunesystem). Sickle cell disease. Gout. Spinal cord injury. What are the signs or symptoms? Symptoms of this condition include: Needing to urinate right away (urgency). Frequent urination. This may include small amounts of urine each time you urinate. Pain or burning with urination. Blood in the urine. Urine that smells bad or unusual. Trouble urinating. Cloudy urine. Vaginal discharge, if you are female. Pain in the abdomen or the lower back. You may also have: Vomiting or a decreased appetite. Confusion. Irritability or tiredness. A fever or chills. Diarrhea. The first symptom in older adults may be confusion. In some cases, they may not have any symptoms until the infection has worsened. How is this diagnosed? This  condition is diagnosed based on your medical history and a physical exam. You may also have other tests, including: Urine tests. Blood tests. Tests for STIs (sexually transmitted infections). If you have had more than one UTI, a cystoscopy or imaging studies may be done to determine the cause of the infections. How is this treated? Treatment for this condition includes: Antibiotic medicine. Over-the-counter medicines to treat discomfort. Drinking enough water to stay hydrated. If you have frequent infections or have other conditions such as a kidney stone, you may need to see a health care provider who specializes in the urinary tract (urologist). In rare cases, urinary tract infections can cause sepsis. Sepsis is a life-threatening condition that occurs when the body responds to an infection. Sepsis is treated in the hospital with IV antibiotics, fluids, and other medicines. Follow these instructions at home: Medicines Take over-the-counter and prescription medicines only as told by your health care provider. If you were prescribed an antibiotic medicine, take it as told by your health care provider. Do not stop using the antibiotic even if you start to feel better. General instructions Make sure you: Empty your bladder often and completely. Do not hold urine for long periods of time. Empty your bladder after sex. Wipe from front to back after urinating or having a bowel movement if you are female. Use each tissue only one time when you wipe. Drink enough fluid to keep your urine pale yellow. Keep all follow-up visits. This is  important. Contact a health care provider if: Your symptoms do not get better after 1-2 days. Your symptoms go away and then return. Get help right away if: You have severe pain in your back or your lower abdomen. You have a fever or chills. You have nausea or vomiting. Summary A urinary tract infection (UTI) is an infection of any part of the urinary tract,  which includes the kidneys, ureters, bladder, and urethra. Most urinary tract infections are caused by bacteria in your genital area. Treatment for this condition often includes antibiotic medicines. If you were prescribed an antibiotic medicine, take it as told by your health care provider. Do not stop using the antibiotic even if you start to feel better. Keep all follow-up visits. This is important. This information is not intended to replace advice given to you by your health care provider. Make sure you discuss any questions you have with your health care provider. Document Revised: 08/27/2019 Document Reviewed: 08/27/2019 Elsevier Patient Education  2022 [employer].

## 2021-01-27 LAB — URINALYSIS, COMPLETE W/RFL CULTURE
Bilirubin Urine: NEGATIVE
Glucose, UA: NEGATIVE
Hyaline Cast: NONE SEEN /LPF
Ketones, ur: NEGATIVE
Nitrites, Initial: POSITIVE — AB
Specific Gravity, Urine: 1.025 (ref 1.001–1.035)
pH: 5 (ref 5.0–8.0)

## 2021-01-27 LAB — URINE CULTURE
MICRO NUMBER:: 12803989
SPECIMEN QUALITY:: ADEQUATE

## 2021-01-27 LAB — CULTURE INDICATED

## 2021-03-09 ENCOUNTER — Encounter: Payer: Self-pay | Admitting: Obstetrics and Gynecology

## 2021-03-09 ENCOUNTER — Other Ambulatory Visit: Payer: Self-pay

## 2021-03-09 ENCOUNTER — Ambulatory Visit (INDEPENDENT_AMBULATORY_CARE_PROVIDER_SITE_OTHER): Payer: Managed Care, Other (non HMO) | Admitting: Obstetrics and Gynecology

## 2021-03-09 VITALS — BP 118/78 | HR 99 | Resp 16 | Ht 62.25 in | Wt 191.0 lb

## 2021-03-09 DIAGNOSIS — Z800 Family history of malignant neoplasm of digestive organs: Secondary | ICD-10-CM | POA: Diagnosis not present

## 2021-03-09 DIAGNOSIS — Z01419 Encounter for gynecological examination (general) (routine) without abnormal findings: Secondary | ICD-10-CM

## 2021-03-09 DIAGNOSIS — N816 Rectocele: Secondary | ICD-10-CM

## 2021-03-09 DIAGNOSIS — N814 Uterovaginal prolapse, unspecified: Secondary | ICD-10-CM

## 2021-03-09 DIAGNOSIS — R7303 Prediabetes: Secondary | ICD-10-CM

## 2021-03-09 DIAGNOSIS — Z1211 Encounter for screening for malignant neoplasm of colon: Secondary | ICD-10-CM | POA: Diagnosis not present

## 2021-03-09 NOTE — Progress Notes (Deleted)
See other note

## 2021-03-09 NOTE — Patient Instructions (Signed)

## 2021-03-09 NOTE — Progress Notes (Signed)
56 y.o. [id number] Married White or Caucasian Not Hispanic or Latino female here for annual exam.  No vaginal bleeding.  H/O bladder sling in 1999. H/O mild GSI, small amounts daily, tolerable.    Last August she was seen c/o prolapse and was noted to have a grade 2 uterine prolapse and a very small grade 2 rectocele. Not bothering her.    No bowel c/o.    Sexually active, no pain.    No LMP recorded. Patient is postmenopausal.          Sexually active: Yes.    The current method of family planning is post menopausal status.    Exercising: Yes.     occ Smoker:  no   Health Maintenance: Pap:  02-17-17 neg HPV HR neg, 12-09-14 ascus HPV HR neg History of abnormal Pap:  yes, f/u was normal  MMG:  07-24-20 category b density birads 1:neg BMD:   none Colonoscopy: can't remember date, polyps. Overdue. Mom with colon cancer.  TDaP:  2014 Gardasil: none    reports that she has never smoked. She has never used smokeless tobacco. She reports that she does not currently use alcohol. She reports that she does not use drugs. Teaches preschool.  She has 2 sons. One son has type 1 DM and Asperger's, he is engaged and is partner is pregnant (baby boy due in July). Other son works on the [employer], mining, Comptroller (tells the [employer] mechanics what to do). Husbands son has 2 kids (she helps with child care).        Past Medical History:  Diagnosis Date   History of chlamydia      1990   Multiple thyroid nodules     Prediabetes     Sleep apnea             Past Surgical History:  Procedure Laterality Date   INTRAUTERINE DEVICE INSERTION   07/18/2008    MIRENA   SALIVARY GLAND SURGERY        ABSCESS   URETHRAL SLING   1999    WREN            Current Outpatient Medications  Medication Sig Dispense Refill   fluticasone (FLONASE) 50 MCG/ACT nasal spray Place into both nostrils as needed for allergies or rhinitis.       loratadine (CLARITIN) 10 MG tablet Take 10 mg by mouth daily.        Multiple Vitamin (MULTIVITAMIN) tablet Take 1 tablet by mouth daily.        No current facility-administered medications for this visit.           Family History  Problem Relation Age of Onset   Hypertension Mother     Diabetes Mother     Colon polyps Mother     Colon cancer Mother     Hypertension Father     Cancer Father          lung and brain   Diabetes Father     Other Sister          hypoglycemic   Colon polyps Sister     Depression Brother     Cancer Maternal Grandmother          UTERINE      Review of Systems  Constitutional: Negative.   HENT: Negative.    Eyes: Negative.   Respiratory: Negative.    Cardiovascular: Negative.   Gastrointestinal: Negative.   Endocrine: Negative.   Genitourinary: Negative.  Musculoskeletal: Negative.   Skin: Negative.   Allergic/Immunologic: Negative.   Neurological: Negative.   Hematological: Negative.   Psychiatric/Behavioral: Negative.      Exam:   BP 118/78    Pulse 99    Resp 16    Ht 5' 2.25" (1.581 m)    Wt 191 lb (86.6 kg)    BMI 34.65 kg/m   Weight change: @WEIGHTCHANGE @ Height:   Height: 5' 2.25" (158.1 cm)     Ht Readings from Last 3 Encounters:  03/09/21 5' 2.25" (1.581 m)  08/28/20 5' 2.25" (1.581 m)  02/25/20 5' 2.25" (1.581 m)      General appearance: alert, cooperative and appears stated age Head: Normocephalic, without obvious abnormality, atraumatic Neck: no adenopathy, supple, symmetrical, trachea midline and thyroid normal to inspection and palpation Lungs: clear to auscultation bilaterally Cardiovascular: regular rate and rhythm Breasts: normal appearance, no masses or tenderness Abdomen: soft, non-tender; non distended,  no masses,  no organomegaly Extremities: extremities normal, atraumatic, no cyanosis or edema Skin: Skin color, texture, turgor normal. No rashes or lesions Lymph nodes: Cervical, supraclavicular, and axillary nodes normal. No abnormal inguinal nodes palpated Neurologic: Grossly  normal     Pelvic: External genitalia:  no lesions              Urethra:  normal appearing urethra with no masses, tenderness or lesions              Bartholins and Skenes: normal                 Vagina: normal appearing vagina with normal color and discharge, no lesions.With valsalva she has a grade 2 uterine prolapse and a very small grade 2 rectocele.                Cervix: no lesions               Bimanual Exam:  Uterus:  normal size, contour, position, consistency, mobility, non-tender              Adnexa: no mass, fullness, tenderness               Rectovaginal: Confirms               Anus:  normal sphincter tone, no lesions   Caryn Bee chaperoned for the exam.   1. Well woman exam Discussed breast self exam Discussed calcium and vit D intake Pap next year Mammogram in 6/23 Labs with primary   2. Colon cancer screening - Ambulatory referral to Gastroenterology   3. FH: colon cancer - Ambulatory referral to Gastroenterology   4. Prediabetes Follow up with primary   5. Uterine prolapse Tolerable   6. Rectocele Tolerable

## 2021-06-11 ENCOUNTER — Other Ambulatory Visit: Payer: Self-pay | Admitting: Obstetrics and Gynecology

## 2021-06-11 DIAGNOSIS — Z1231 Encounter for screening mammogram for malignant neoplasm of breast: Secondary | ICD-10-CM

## 2021-07-05 ENCOUNTER — Encounter: Payer: Self-pay | Admitting: Physician Assistant

## 2021-07-30 ENCOUNTER — Ambulatory Visit: Payer: BC Managed Care – PPO | Admitting: Physician Assistant

## 2021-08-01 ENCOUNTER — Ambulatory Visit: Payer: BC Managed Care – PPO

## 2021-08-01 ENCOUNTER — Ambulatory Visit
Admission: RE | Admit: 2021-08-01 | Discharge: 2021-08-01 | Disposition: A | Discharge status: Home / Self Care | Payer: Commercial Managed Care - HMO | Source: Ambulatory Visit | Attending: Obstetrics and Gynecology | Admitting: Obstetrics and Gynecology

## 2021-08-01 DIAGNOSIS — Z1231 Encounter for screening mammogram for malignant neoplasm of breast: Secondary | ICD-10-CM

## 2021-11-22 ENCOUNTER — Ambulatory Visit: Payer: Commercial Managed Care - HMO | Admitting: Radiology

## 2021-11-22 VITALS — BP 144/82

## 2021-11-22 DIAGNOSIS — R300 Dysuria: Secondary | ICD-10-CM | POA: Diagnosis not present

## 2021-11-22 MED ORDER — SULFAMETHOXAZOLE-TRIMETHOPRIM 800-160 MG PO TABS: 1.0000 | ORAL_TABLET | Freq: Two times a day (BID) | ORAL | 0 refills | Status: DC

## 2021-11-22 NOTE — Progress Notes (Signed)
      SUBJECTIVE: Miranda Hill is a 56 y.o. female who complains of bladder pressure last night, dysuria, no frequency, no urgency, took AZO this morning at 9am.  Also took antibiotics for sinus infection over the weekend.  Allergies  Allergen Reactions   Codeine Nausea And Vomiting   Tree Extract     TREE NUTS Nut meg, pistachios, macadam     Current Outpatient Medications on File Prior to Visit  Medication Sig Dispense Refill   Phenazopyridine HCl (AZO TABS PO) Take by mouth.     fluticasone (FLONASE) 50 MCG/ACT nasal spray Place into both nostrils as needed for allergies or rhinitis. (Patient not taking: Reported on 11/22/2021)     loratadine (CLARITIN) 10 MG tablet Take 10 mg by mouth daily. (Patient not taking: Reported on 11/22/2021)     Multiple Vitamin (MULTIVITAMIN) tablet Take 1 tablet by mouth daily. (Patient not taking: Reported on 11/22/2021)     No current facility-administered medications on file prior to visit.    Past Medical History:  Diagnosis Date   History of chlamydia    1990   Multiple thyroid nodules    Prediabetes    Sleep apnea      OBJECTIVE: Appears well, in no apparent distress.  Vital signs are normal. The abdomen is soft without tenderness, guarding, mass, rebound or organomegaly. No CVA tenderness or inguinal adenopathy noted. Urine dipstick unable to read, pt took AZO  Micro exam: >60 WBC's per HPF, 20-40 RBC's per HPF, and many+ bacteria.    Blood pressure (!) 144/82.    Chaperone offered and declined for exam.  ASSESSMENT/PLAN: 1. Dysuria - Urinalysis,Complete w/RFL Culture - sulfamethoxazole-trimethoprim (BACTRIM DS) 800-160 MG tablet; Take 1 tablet by mouth 2 (two) times daily.  Dispense: 10 tablet; Refill: 0    Will send urine culture and sensitivity.  Treatment per orders - also push fluids, avoid bladder irritants. Instructed she may use Pyridium OTC prn. Call or return to clinic prn if these symptoms worsen or fail to improve  as anticipated. Pyelo precautions reviewed with patient.

## 2021-11-25 LAB — URINE CULTURE
MICRO NUMBER:: 14105137
SPECIMEN QUALITY:: ADEQUATE

## 2021-11-25 LAB — URINALYSIS, COMPLETE W/RFL CULTURE
Hyaline Cast: NONE SEEN /LPF
WBC, UA: >=60 /HPF — AB (ref 0–5)

## 2021-11-25 LAB — CULTURE INDICATED

## 2021-11-27 ENCOUNTER — Telehealth: Payer: Self-pay | Admitting: *Deleted

## 2021-11-27 MED ORDER — NITROFURANTOIN MONOHYD MACRO 100 MG PO CAPS: 100.0000 mg | ORAL_CAPSULE | Freq: Two times a day (BID) | ORAL | 0 refills | Status: DC

## 2021-11-27 NOTE — Telephone Encounter (Signed)
Per result note "E.coli uti, please switch to Macrobid po BID x 7 days"  Patient informed, Rx sent.

## 2021-11-27 NOTE — Telephone Encounter (Signed)
Patient called was prescribed Bactrim on 11/22/21 reports while taking medication she was also in the middle of doing colonoscopy prep.  She ended vomiting some of the bactrim, she said this am she noticed some mild burning with urination. She asked if a refill could be provided? Please advise

## 2021-11-27 NOTE — Telephone Encounter (Signed)
See result note- Rx needs to be changed to [facility]

## 2022-02-11 ENCOUNTER — Ambulatory Visit: Payer: BC Managed Care – PPO | Admitting: Dermatology

## 2022-06-18 ENCOUNTER — Other Ambulatory Visit: Payer: Self-pay | Admitting: Obstetrics and Gynecology

## 2022-06-18 DIAGNOSIS — Z1231 Encounter for screening mammogram for malignant neoplasm of breast: Secondary | ICD-10-CM

## 2022-07-02 ENCOUNTER — Encounter: Payer: Self-pay | Admitting: Obstetrics and Gynecology

## 2022-07-02 ENCOUNTER — Ambulatory Visit: Payer: Commercial Managed Care - HMO | Admitting: Obstetrics and Gynecology

## 2022-07-02 VITALS — BP 132/86 | HR 92 | Ht 62.0 in | Wt 185.0 lb

## 2022-07-02 DIAGNOSIS — R350 Frequency of micturition: Secondary | ICD-10-CM

## 2022-07-02 DIAGNOSIS — N816 Rectocele: Secondary | ICD-10-CM

## 2022-07-02 DIAGNOSIS — N814 Uterovaginal prolapse, unspecified: Secondary | ICD-10-CM

## 2022-07-02 MED ORDER — NITROFURANTOIN MONOHYD MACRO 100 MG PO CAPS: 100.0000 mg | ORAL_CAPSULE | Freq: Two times a day (BID) | ORAL | 0 refills | Status: DC

## 2022-07-02 NOTE — Progress Notes (Signed)
GYNECOLOGY  VISIT   HPI: 57 y.o.   Married  Caucasian  female   [phone] with No LMP recorded. Patient is postmenopausal.   here for   UTI. Pt noticed urinary frequency and urgency last Sat. Pt feels pressure more with bowel moments. Pt felt a little burning after but not fully. Pt does have appt with urologist.  Hydrating helps to improve her symptoms.  No hematuria.  No back pain, nausea or fever.   She experiences sensation like she is not emptying her bladder well. She has to sit forward on the toilet to void better.   Having nocturia and wears protection.   She has urgency.   She feels like she is always leaking. She is not certain if she is specifically leaking with cough, laugh, and sneeze.  Does a lot of lifting.   E Coli UTI 11/22/21.   Has uterine prolapse and rectocele.   Hx urethral sling 1999, Dr. Annabell Howells.   Is prediabetic.    GYNECOLOGIC HISTORY: No LMP recorded. Patient is postmenopausal. Contraception:  PMP/ vasectomy Menopausal hormone therapy:  n/a Last mammogram:  08/01/21 Breast Density Cat C, BI-RADS CAT 1 neg Last pap smear: 02-17-17 neg HPV HR neg, 12-09-14 ascus HPV HR neg         OB History     Gravida  3   Para  2   Term  2   Preterm      AB  1   Living  2      SAB  1   IAB      Ectopic      Multiple      Live Births  2              Patient Active Problem List   Diagnosis Date Noted   Prediabetes 03/09/2021   Urticaria, unspecified 08/28/2020   Carpal tunnel syndrome 08/28/2020   Chronic sinusitis 08/28/2020   Gastroesophageal reflux disease without esophagitis 08/28/2020   Obstructive sleep apnea syndrome 08/28/2020   Thyroid nodule 08/28/2020   Multiple thyroid nodules 01/19/2014   Family history of diabetes mellitus 09/18/2012   Family history of colonic polyps 09/18/2012   Mood swings 10/18/2011   History of gestational diabetes 09/17/2011   SUI (stress urinary incontinence, female) 09/17/2011   Weight gain  09/17/2011   PMDD (premenstrual dysphoric disorder) 09/17/2011    Past Medical History:  Diagnosis Date   History of chlamydia    1990   Multiple thyroid nodules    Prediabetes    Sleep apnea     Past Surgical History:  Procedure Laterality Date   INTRAUTERINE DEVICE INSERTION  07/18/2008   MIRENA   SALIVARY GLAND SURGERY     ABSCESS   URETHRAL SLING  1999   WREN    Current Outpatient Medications  Medication Sig Dispense Refill   lisinopril (ZESTRIL) 5 MG tablet 1 tablet Orally Once a day for 90 days     Multiple Vitamin (MULTIVITAMIN) tablet Take 1 tablet by mouth daily.     nitrofurantoin, macrocrystal-monohydrate, (MACROBID) 100 MG capsule Take 1 capsule (100 mg total) by mouth 2 (two) times daily. 14 capsule 0   Phenazopyridine HCl (AZO TABS PO) Take by mouth.     No current facility-administered medications for this visit.     ALLERGIES: Codeine and Tree extract  Family History  Problem Relation Age of Onset   Hypertension Mother    Diabetes Mother    Colon polyps Mother  Colon cancer Mother    Hypertension Father    Cancer Father        lung and brain   Diabetes Father    Other Sister        hypoglycemic   Colon polyps Sister    Depression Brother    Cancer Maternal Grandmother        UTERINE    Social History   Socioeconomic History   Marital status: Married    Spouse name: Not on file   Number of children: Not on file   Years of education: Not on file   Highest education level: Not on file  Occupational History   Not on file  Tobacco Use   Smoking status: Never   Smokeless tobacco: Never  Vaping Use   Vaping Use: Never used  Substance and Sexual Activity   Alcohol use: Not Currently   Drug use: No   Sexual activity: Yes    Birth control/protection: Post-menopausal    Comment: husband with vasectomy  Other Topics Concern   Not on file  Social History Narrative   Not on file   Social Determinants of Health   Financial Resource  Strain: Not on file  Food Insecurity: Not on file  Transportation Needs: Not on file  Physical Activity: Not on file  Stress: Not on file  Social Connections: Not on file  Intimate Partner Violence: Not on file    Review of Systems  Genitourinary:  Positive for dysuria, frequency and urgency.  All other systems reviewed and are negative.   PHYSICAL EXAMINATION:    BP 132/86 (BP Location: Left Arm, Patient Position: Sitting, Cuff Size: Large)   Pulse 92   Ht 5\' 2"  (1.575 m)   Wt 185 lb (83.9 kg)   SpO2 98%   BMI 33.84 kg/m     General appearance: alert, cooperative and appears stated age Head: Normocephalic, without obvious abnormality, atraumatic Neck: no adenopathy, supple, symmetrical, trachea midline and thyroid normal to inspection and palpation Lungs: clear to auscultation bilaterally Breasts: normal appearance, no masses or tenderness, No nipple retraction or dimpling, No nipple discharge or bleeding, No axillary or supraclavicular adenopathy Heart: regular rate and rhythm Abdomen: soft, non-tender, no masses,  no organomegaly Extremities: extremities normal, atraumatic, no cyanosis or edema Skin: Skin color, texture, turgor normal. No rashes or lesions Lymph nodes: Cervical, supraclavicular, and axillary nodes normal. No abnormal inguinal nodes palpated Neurologic: Grossly normal  Pelvic: External genitalia:  no lesions              Urethra:  normal appearing urethra with no masses, tenderness or lesions              Bartholins and Skenes: normal                 Vagina: normal appearing vagina with normal color and discharge, no lesions              Cervix: no lesions.  Second degree rectocele and uterine prolapse.  Bladder is well supported.                Bimanual Exam:  Uterus:  normal size, contour, position, consistency, mobility, non-tender              Adnexa: no mass, fullness, tenderness           Chaperone was present for exam:  Warren Lacy,  CMA  ASSESSMENT  UTI.  Pelvic organ prolapse.  Mixed incontinence.  Status post  urethral sling.   PLAN  Urinalysis:  sg 1.020, ph 5.5, 20 - 40 WBC, 0 - 2 RBC, few bacteria.  UC sent.  Macrobid 100 mg po bid x 5 days.  Pyridium ok to use OTC as AZO.  Return for pessary fitting.  She may return to her urologist regarding her mixed incontinence.  29 min  total time was spent for this patient encounter, including preparation, face-to-face counseling with the patient, coordination of care, and documentation of the encounter.

## 2022-07-05 LAB — URINE CULTURE
MICRO NUMBER:: 15038652
SPECIMEN QUALITY:: ADEQUATE

## 2022-07-05 LAB — URINALYSIS, COMPLETE W/RFL CULTURE
Bilirubin Urine: NEGATIVE
Glucose, UA: NEGATIVE
Hyaline Cast: NONE SEEN /LPF
Ketones, ur: NEGATIVE
Nitrites, Initial: NEGATIVE
Protein, ur: NEGATIVE
Specific Gravity, Urine: 1.02 (ref 1.001–1.035)
pH: 5.5 (ref 5.0–8.0)

## 2022-07-05 LAB — CULTURE INDICATED

## 2022-08-09 ENCOUNTER — Ambulatory Visit
Admission: RE | Admit: 2022-08-09 | Discharge: 2022-08-09 | Disposition: A | Discharge status: Home / Self Care | Payer: Commercial Managed Care - HMO | Source: Ambulatory Visit | Attending: Obstetrics and Gynecology | Admitting: Obstetrics and Gynecology

## 2022-08-09 DIAGNOSIS — Z1231 Encounter for screening mammogram for malignant neoplasm of breast: Secondary | ICD-10-CM

## 2023-01-30 DIAGNOSIS — Z0000 Encounter for general adult medical examination without abnormal findings: Secondary | ICD-10-CM | POA: Diagnosis not present

## 2023-01-30 DIAGNOSIS — I10 Essential (primary) hypertension: Secondary | ICD-10-CM | POA: Diagnosis not present

## 2023-01-30 DIAGNOSIS — R7303 Prediabetes: Secondary | ICD-10-CM | POA: Diagnosis not present

## 2023-01-30 DIAGNOSIS — E782 Mixed hyperlipidemia: Secondary | ICD-10-CM | POA: Diagnosis not present

## 2023-01-30 DIAGNOSIS — Z6833 Body mass index (BMI) 33.0-33.9, adult: Secondary | ICD-10-CM | POA: Diagnosis not present

## 2023-02-10 DIAGNOSIS — Z0100 Encounter for examination of eyes and vision without abnormal findings: Secondary | ICD-10-CM | POA: Diagnosis not present

## 2023-03-24 DIAGNOSIS — M25562 Pain in left knee: Secondary | ICD-10-CM | POA: Diagnosis not present

## 2023-06-27 ENCOUNTER — Other Ambulatory Visit: Payer: Self-pay | Admitting: Family Medicine

## 2023-06-27 DIAGNOSIS — Z1231 Encounter for screening mammogram for malignant neoplasm of breast: Secondary | ICD-10-CM

## 2023-08-10 ENCOUNTER — Other Ambulatory Visit: Payer: Self-pay | Admitting: Medical Genetics

## 2023-08-11 ENCOUNTER — Ambulatory Visit
Admission: RE | Admit: 2023-08-11 | Discharge: 2023-08-11 | Disposition: A | Discharge status: Home / Self Care | Source: Ambulatory Visit | Attending: Family Medicine | Admitting: Family Medicine

## 2023-08-11 DIAGNOSIS — Z1231 Encounter for screening mammogram for malignant neoplasm of breast: Secondary | ICD-10-CM | POA: Diagnosis not present

## 2023-08-25 DIAGNOSIS — L813 Cafe au lait spots: Secondary | ICD-10-CM | POA: Diagnosis not present

## 2023-08-25 DIAGNOSIS — L821 Other seborrheic keratosis: Secondary | ICD-10-CM | POA: Diagnosis not present

## 2023-08-25 DIAGNOSIS — D225 Melanocytic nevi of trunk: Secondary | ICD-10-CM | POA: Diagnosis not present

## 2023-08-25 DIAGNOSIS — L814 Other melanin hyperpigmentation: Secondary | ICD-10-CM | POA: Diagnosis not present

## 2023-08-25 DIAGNOSIS — L910 Hypertrophic scar: Secondary | ICD-10-CM | POA: Diagnosis not present

## 2023-08-25 DIAGNOSIS — D1801 Hemangioma of skin and subcutaneous tissue: Secondary | ICD-10-CM | POA: Diagnosis not present

## 2023-09-01 ENCOUNTER — Encounter: Payer: Self-pay | Admitting: Obstetrics and Gynecology

## 2023-09-01 ENCOUNTER — Other Ambulatory Visit (HOSPITAL_COMMUNITY)
Admission: RE | Admit: 2023-09-01 | Discharge: 2023-09-01 | Disposition: A | Discharge status: Home / Self Care | Source: Ambulatory Visit | Attending: Obstetrics and Gynecology | Admitting: Obstetrics and Gynecology

## 2023-09-01 ENCOUNTER — Ambulatory Visit (INDEPENDENT_AMBULATORY_CARE_PROVIDER_SITE_OTHER): Admitting: Obstetrics and Gynecology

## 2023-09-01 VITALS — BP 130/80 | HR 102 | Temp 98.4°F | Ht 62.0 in | Wt 186.0 lb

## 2023-09-01 DIAGNOSIS — Z1331 Encounter for screening for depression: Secondary | ICD-10-CM

## 2023-09-01 DIAGNOSIS — N814 Uterovaginal prolapse, unspecified: Secondary | ICD-10-CM | POA: Insufficient documentation

## 2023-09-01 DIAGNOSIS — Z124 Encounter for screening for malignant neoplasm of cervix: Secondary | ICD-10-CM | POA: Diagnosis not present

## 2023-09-01 DIAGNOSIS — Z01419 Encounter for gynecological examination (general) (routine) without abnormal findings: Secondary | ICD-10-CM | POA: Insufficient documentation

## 2023-09-01 MED ORDER — ESTRADIOL 0.1 MG/GM VA CREA: TOPICAL_CREAM | VAGINAL | 1 refills | Status: DC

## 2023-09-01 NOTE — Progress Notes (Signed)
 58 y.o. [id number] female with thyroid  nodules, SUI (sling 1999), pelvic organ prolapse here for annual exam. Married, second marriage. Teacher, preschool.  She reports ongoing prolapse. Interested in trying pessary again. Donut pessary fitted by urology last year, never stayed in. Sits forward to pee.  Urine sample provided: Yes  Abnormal bleeding: none Pelvic discharge or pain: none Breast mass, nipple discharge or skin changes : none  Sexually active: Yes Birth control: None Last PAP: 2019 NIL, HPV neg Last mammogram: 08/11/23 Bi-Rads 1, B Last colonoscopy: 08/2021 per pt   Exercising: Yes, calisthenics Smoker: No  Flowsheet Row Office Visit from 09/01/2023 in [facility]  PHQ-2 Total Score 0      GYN HISTORY: Sling, 1999 Hx of abnormal PAP, 2016- ASCUS, HPV neg  OB History  Gravida Para Term Preterm AB Living  3 2 2  1 2   SAB IAB Ectopic Multiple Live Births  1    2    # Outcome Date GA Lbr Len/2nd Weight Sex Type Anes PTL Lv  3 SAB           2 Term           1 Term            Past Medical History:  Diagnosis Date   History of chlamydia    1990   Multiple thyroid  nodules    Prediabetes    Sleep apnea    Past Surgical History:  Procedure Laterality Date   INTRAUTERINE DEVICE INSERTION  07/18/2008   MIRENA   SALIVARY GLAND SURGERY     ABSCESS   URETHRAL SLING  1999   WREN   Current Outpatient Medications on File Prior to Visit  Medication Sig Dispense Refill   amLODipine (NORVASC) 2.5 MG tablet Take 2.5 mg by mouth daily.     No current facility-administered medications on file prior to visit.   Social History   Socioeconomic History   Marital status: Married    Spouse name: Not on file   Number of children: Not on file   Years of education: Not on file   Highest education level: Not on file  Occupational History   Not on file  Tobacco Use   Smoking status: Never   Smokeless tobacco: Never  Vaping Use   Vaping  status: Never Used  Substance and Sexual Activity   Alcohol use: Not Currently   Drug use: No   Sexual activity: Yes    Birth control/protection: Post-menopausal    Comment: husband with vasectomy  Other Topics Concern   Not on file  Social History Narrative   Not on file   Social Drivers of Health   Financial Resource Strain: Not on file  Food Insecurity: Not on file  Transportation Needs: Not on file  Physical Activity: Not on file  Stress: Not on file  Social Connections: Not on file  Intimate Partner Violence: Not on file   Family History  Problem Relation Age of Onset   Hypertension Mother    Diabetes Mother    Colon polyps Mother    Colon cancer Mother    Hypertension Father    Cancer Father        lung and brain   Diabetes Father    Other Sister        hypoglycemic   Colon polyps Sister    Depression Brother    Cancer Maternal Grandmother        UTERINE  Allergies  Allergen Reactions   Codeine Nausea And Vomiting   Tree Extract     TREE NUTS Nut meg, pistachios, macadam      PE Today's Vitals   09/01/23 1428  BP: 130/80  Pulse: (!) 102  Temp: 98.4 F (36.9 C)  TempSrc: Oral  SpO2: 96%  Weight: 186 lb (84.4 kg)  Height: 5' 2 (1.575 m)   Body mass index is 34.02 kg/m.  Physical Exam Vitals reviewed. Exam conducted with a chaperone present.  Constitutional:      General: She is not in acute distress.    Appearance: Normal appearance.  HENT:     Head: Normocephalic and atraumatic.     Nose: Nose normal.  Eyes:     Extraocular Movements: Extraocular movements intact.     Conjunctiva/sclera: Conjunctivae normal.  Neck:     Thyroid : No thyroid  mass, thyromegaly or thyroid  tenderness.  Pulmonary:     Effort: Pulmonary effort is normal.  Chest:     Chest wall: No mass or tenderness.  Breasts:    Right: Normal. No swelling, mass, nipple discharge, skin change or tenderness.     Left: Normal. No swelling, mass, nipple discharge, skin  change or tenderness.  Abdominal:     General: There is no distension.     Palpations: Abdomen is soft.     Tenderness: There is no abdominal tenderness.  Genitourinary:    General: Normal vulva.     Exam position: Lithotomy position.     Urethra: No prolapse.     Vagina: Normal. No vaginal discharge or bleeding.     Cervix: Normal. No lesion.     Uterus: Normal. Not enlarged and not tender.      Adnexa: Right adnexa normal and left adnexa normal.     Comments: grade 2 uterine prolapse and rectocele Musculoskeletal:        General: Normal range of motion.     Cervical back: Normal range of motion.  Lymphadenopathy:     Upper Body:     Right upper body: No axillary adenopathy.     Left upper body: No axillary adenopathy.     Lower Body: No right inguinal adenopathy. No left inguinal adenopathy.  Skin:    General: Skin is warm and dry.  Neurological:     General: No focal deficit present.     Mental Status: She is alert.  Psychiatric:        Mood and Affect: Mood normal.        Behavior: Behavior normal.       Assessment and Plan:        Well woman exam with routine gynecological exam Assessment & Plan: Cervical cancer screening performed according to ASCCP guidelines. Encouraged annual mammogram screening Colonoscopy UTD DXA due 58yo Labs and immunizations with her primary Encouraged safe sexual practices as indicated Encouraged healthy lifestyle practices with diet and exercise For patients under 50-70yo, I recommend 1200mg  calcium daily and 600IU of vitamin D  daily.    Negative depression screening  Cervical cancer screening -     Cytology - PAP  Uterine prolapse Assessment & Plan: Discussed POP- can be managed with weight loss, kegel exercises, PFPT, and vaginal estrogen. For symptomatic prolapse. Start PV estrace . Encouraged 10% weight loss, RTO for pessary fitting   Orders: -     Estradiol ; Apply 1/2 gram to vulva nightly for 2 weeks then decrease to  1/2 gram to vulva two nights a week.  Dispense: 42.5 g;  Refill: 1   Vera LULLA Pa, MD

## 2023-09-01 NOTE — Patient Instructions (Signed)

## 2023-09-01 NOTE — Assessment & Plan Note (Addendum)
 Discussed POP- can be managed with weight loss, kegel exercises, PFPT, and vaginal estrogen. For symptomatic prolapse. Start PV estrace . Encouraged 10% weight loss, RTO for pessary fitting

## 2023-09-01 NOTE — Assessment & Plan Note (Signed)
 Cervical cancer screening performed according to ASCCP guidelines. Encouraged annual mammogram screening Colonoscopy UTD DXA due 58yo Labs and immunizations with her primary Encouraged safe sexual practices as indicated Encouraged healthy lifestyle practices with diet and exercise For patients under 50-70yo, I recommend 1200mg  calcium daily and 600IU of vitamin D  daily.

## 2023-09-08 ENCOUNTER — Ambulatory Visit: Payer: Self-pay | Admitting: Obstetrics and Gynecology

## 2023-09-08 LAB — CYTOLOGY - PAP
Comment: NEGATIVE
Diagnosis: NEGATIVE
High risk HPV: NEGATIVE

## 2023-09-19 ENCOUNTER — Encounter: Payer: Self-pay | Admitting: Family Medicine

## 2023-09-19 ENCOUNTER — Other Ambulatory Visit: Payer: Self-pay | Admitting: Family Medicine

## 2023-09-19 DIAGNOSIS — R109 Unspecified abdominal pain: Secondary | ICD-10-CM

## 2023-09-19 DIAGNOSIS — K219 Gastro-esophageal reflux disease without esophagitis: Secondary | ICD-10-CM

## 2023-09-19 DIAGNOSIS — R140 Abdominal distension (gaseous): Secondary | ICD-10-CM

## 2023-09-22 ENCOUNTER — Ambulatory Visit
Admission: RE | Admit: 2023-09-22 | Discharge: 2023-09-22 | Disposition: A | Discharge status: Home / Self Care | Source: Ambulatory Visit | Attending: Family Medicine | Admitting: Family Medicine

## 2023-09-22 ENCOUNTER — Encounter: Payer: Self-pay | Admitting: Radiology

## 2023-09-22 ENCOUNTER — Other Ambulatory Visit (HOSPITAL_COMMUNITY): Payer: Self-pay | Admitting: Family Medicine

## 2023-09-22 DIAGNOSIS — R140 Abdominal distension (gaseous): Secondary | ICD-10-CM

## 2023-09-22 DIAGNOSIS — K219 Gastro-esophageal reflux disease without esophagitis: Secondary | ICD-10-CM

## 2023-09-22 DIAGNOSIS — R109 Unspecified abdominal pain: Secondary | ICD-10-CM

## 2023-09-22 DIAGNOSIS — C800 Disseminated malignant neoplasm, unspecified: Secondary | ICD-10-CM

## 2023-09-22 MED ORDER — IOPAMIDOL (ISOVUE-300) INJECTION 61%
100.0000 mL | Freq: Once | INTRAVENOUS | Status: AC | PRN
  Administered 2023-09-22: 100 mL via INTRAVENOUS

## 2023-09-23 ENCOUNTER — Other Ambulatory Visit (HOSPITAL_COMMUNITY): Payer: Self-pay | Admitting: Gastroenterology

## 2023-09-23 ENCOUNTER — Encounter: Payer: Self-pay | Admitting: Gastroenterology

## 2023-09-23 DIAGNOSIS — C786 Secondary malignant neoplasm of retroperitoneum and peritoneum: Secondary | ICD-10-CM | POA: Diagnosis not present

## 2023-09-23 DIAGNOSIS — R188 Other ascites: Secondary | ICD-10-CM

## 2023-09-23 DIAGNOSIS — R109 Unspecified abdominal pain: Secondary | ICD-10-CM

## 2023-09-23 DIAGNOSIS — C800 Disseminated malignant neoplasm, unspecified: Secondary | ICD-10-CM

## 2023-09-24 ENCOUNTER — Ambulatory Visit (HOSPITAL_COMMUNITY)
Admission: RE | Admit: 2023-09-24 | Discharge: 2023-09-24 | Disposition: A | Discharge status: Home / Self Care | Source: Ambulatory Visit | Attending: Gastroenterology | Admitting: Gastroenterology

## 2023-09-24 ENCOUNTER — Other Ambulatory Visit: Payer: Self-pay | Admitting: Gastroenterology

## 2023-09-24 ENCOUNTER — Encounter: Payer: Self-pay | Admitting: Gastroenterology

## 2023-09-24 ENCOUNTER — Telehealth: Payer: Self-pay

## 2023-09-24 DIAGNOSIS — C801 Malignant (primary) neoplasm, unspecified: Secondary | ICD-10-CM | POA: Diagnosis not present

## 2023-09-24 DIAGNOSIS — R180 Malignant ascites: Secondary | ICD-10-CM | POA: Insufficient documentation

## 2023-09-24 DIAGNOSIS — R188 Other ascites: Secondary | ICD-10-CM

## 2023-09-24 LAB — PROTEIN, PLEURAL OR PERITONEAL FLUID: Total protein, fluid: 4.4 g/dL

## 2023-09-24 LAB — ALBUMIN, PLEURAL OR PERITONEAL FLUID: Albumin, Fluid: 2.7 g/dL

## 2023-09-24 MED ORDER — LIDOCAINE-EPINEPHRINE (PF) 2 %-1:200000 IJ SOLN
INTRAMUSCULAR | Status: AC
  Filled 2023-09-24: qty 20

## 2023-09-24 NOTE — Procedures (Signed)
 Ultrasound-guided diagnostic and therapeutic paracentesis performed yielding 4.7 liters of hazy, blood-tinged  fluid. No immediate complications. A portion of the fluid was sent to the lab for preordered studies. EBL none.

## 2023-09-24 NOTE — Telephone Encounter (Signed)
 Patient called & wanted Dr Dallie to know that she had a CT scan & she said the results were not good. She said she has to go have her stomach drained. She will also have to go have a PET scan & a biopsy done with the oncologist. Patient was very tearful over the phone. She wanted Dr Dallie to be aware. I let her know that we are here for her & will be sending her well wishes as she goes through all of this.

## 2023-09-25 ENCOUNTER — Ambulatory Visit (HOSPITAL_COMMUNITY)
Admission: RE | Admit: 2023-09-25 | Discharge: 2023-09-25 | Disposition: A | Discharge status: Home / Self Care | Source: Ambulatory Visit | Attending: Family Medicine | Admitting: Family Medicine

## 2023-09-25 DIAGNOSIS — R180 Malignant ascites: Secondary | ICD-10-CM | POA: Diagnosis not present

## 2023-09-25 DIAGNOSIS — C800 Disseminated malignant neoplasm, unspecified: Secondary | ICD-10-CM | POA: Insufficient documentation

## 2023-09-25 DIAGNOSIS — C801 Malignant (primary) neoplasm, unspecified: Secondary | ICD-10-CM | POA: Diagnosis not present

## 2023-09-25 LAB — BODY FLUID CELL COUNT WITH DIFFERENTIAL
Eos, Fluid: 0 %
Lymphs, Fluid: 71 %
Monocyte-Macrophage-Serous Fluid: 20 % — ABNORMAL LOW (ref 50–90)
Neutrophil Count, Fluid: 9 % (ref 0–25)
Total Nucleated Cell Count, Fluid: 525 uL (ref 0–1000)

## 2023-09-25 LAB — GLUCOSE, CAPILLARY: Glucose-Capillary: 116 mg/dL — ABNORMAL HIGH (ref 70–99)

## 2023-09-25 MED ORDER — FLUDEOXYGLUCOSE F - 18 (FDG) INJECTION
9.6000 | Freq: Once | INTRAVENOUS | Status: AC
  Administered 2023-09-25: 9.355 via INTRAVENOUS

## 2023-09-26 ENCOUNTER — Telehealth: Payer: Self-pay | Admitting: *Deleted

## 2023-09-26 LAB — CYTOLOGY - NON PAP

## 2023-09-26 NOTE — Progress Notes (Unsigned)
 Miranda Cornet, MD  Miranda Hill.  If the cytology is not diagnostic, then we can schedule CT guided omental biopsy.  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

## 2023-09-26 NOTE — Telephone Encounter (Signed)
 Called patient to discuss results from recent CT ab/pelvis and PET scan ordered by PCP Awaiting cytology for abdominal paracentesis Significant omental burden suggesting GYN vs GI primary. Referral to oncology made by PCP. In agreement with this plan unless cytology suggests a primary. Patient out of work on temporary leave. Husband present during call. All questions answers

## 2023-09-26 NOTE — Telephone Encounter (Signed)
 Call from [facility] reporting they are not able to reach a provider at any of the Cancer Centers to report PET results on a referral that was sent on 8/25. Informed Ronal that the PET will be printed off and placed on nurse navigator desk to forward to [facility] at next business day. Provided her cell phone of Dr. Lonn since she is on call to give results. She will let Dr. Lonn know that she called [facility] and it will be forwarded to [facility] on 9/2.

## 2023-09-28 ENCOUNTER — Other Ambulatory Visit: Payer: Self-pay

## 2023-09-28 ENCOUNTER — Emergency Department (HOSPITAL_COMMUNITY)
Admission: EM | Admit: 2023-09-28 | Discharge: 2023-09-28 | Disposition: A | Discharge status: Home / Self Care | Attending: Emergency Medicine | Admitting: Emergency Medicine

## 2023-09-28 DIAGNOSIS — R180 Malignant ascites: Secondary | ICD-10-CM | POA: Insufficient documentation

## 2023-09-28 DIAGNOSIS — D72829 Elevated white blood cell count, unspecified: Secondary | ICD-10-CM | POA: Diagnosis not present

## 2023-09-28 DIAGNOSIS — R140 Abdominal distension (gaseous): Secondary | ICD-10-CM | POA: Diagnosis not present

## 2023-09-28 DIAGNOSIS — C800 Disseminated malignant neoplasm, unspecified: Secondary | ICD-10-CM | POA: Diagnosis not present

## 2023-09-28 DIAGNOSIS — C569 Malignant neoplasm of unspecified ovary: Secondary | ICD-10-CM | POA: Insufficient documentation

## 2023-09-28 LAB — CBC WITH DIFFERENTIAL/PLATELET
Abs Immature Granulocytes: 0.07 K/uL (ref 0.00–0.07)
Basophils Absolute: 0 K/uL (ref 0.0–0.1)
Basophils Relative: 0 %
Eosinophils Absolute: 0 K/uL (ref 0.0–0.5)
Eosinophils Relative: 0 %
HCT: 43.8 % (ref 36.0–46.0)
Hemoglobin: 13.7 g/dL (ref 12.0–15.0)
Immature Granulocytes: 1 %
Lymphocytes Relative: 9 %
Lymphs Abs: 1.3 K/uL (ref 0.7–4.0)
MCH: 27.5 pg (ref 26.0–34.0)
MCHC: 31.3 g/dL (ref 30.0–36.0)
MCV: 88 fL (ref 80.0–100.0)
Monocytes Absolute: 1 K/uL (ref 0.1–1.0)
Monocytes Relative: 7 %
Neutro Abs: 11.5 K/uL — ABNORMAL HIGH (ref 1.7–7.7)
Neutrophils Relative %: 83 %
Platelets: 355 K/uL (ref 150–400)
RBC: 4.98 MIL/uL (ref 3.87–5.11)
RDW: 13.2 % (ref 11.5–15.5)
WBC: 13.9 K/uL — ABNORMAL HIGH (ref 4.0–10.5)
nRBC: 0 % (ref 0.0–0.2)

## 2023-09-28 LAB — BODY FLUID CULTURE W GRAM STAIN: Culture: NO GROWTH

## 2023-09-28 LAB — COMPREHENSIVE METABOLIC PANEL WITH GFR
ALT: 10 U/L (ref 0–44)
AST: 18 U/L (ref 15–41)
Albumin: 3.3 g/dL — ABNORMAL LOW (ref 3.5–5.0)
Alkaline Phosphatase: 86 U/L (ref 38–126)
Anion gap: 12 (ref 5–15)
BUN: 16 mg/dL (ref 6–20)
CO2: 23 mmol/L (ref 22–32)
Calcium: 10.6 mg/dL — ABNORMAL HIGH (ref 8.9–10.3)
Chloride: 100 mmol/L (ref 98–111)
Creatinine, Ser: 0.69 mg/dL (ref 0.44–1.00)
GFR, Estimated: >60 mL/min (ref >60)
Glucose, Bld: 148 mg/dL — ABNORMAL HIGH (ref 70–99)
Potassium: 4.5 mmol/L (ref 3.5–5.1)
Sodium: 135 mmol/L (ref 135–145)
Total Bilirubin: 0.5 mg/dL (ref 0.0–1.2)
Total Protein: 6.3 g/dL — ABNORMAL LOW (ref 6.5–8.1)

## 2023-09-28 LAB — PROTIME-INR
INR: 1.1 (ref 0.8–1.2)
Prothrombin Time: 14.7 s (ref 11.4–15.2)

## 2023-09-28 LAB — MAGNESIUM: Magnesium: 2.5 mg/dL — ABNORMAL HIGH (ref 1.7–2.4)

## 2023-09-28 MED ORDER — LIDOCAINE HCL (PF) 1 % IJ SOLN
5.0000 mL | Freq: Once | INTRAMUSCULAR | Status: DC
  Filled 2023-09-28: qty 30

## 2023-09-28 NOTE — ED Provider Notes (Signed)
 [place] EMERGENCY DEPARTMENT AT [facility] Provider Note   CSN: [id number] Arrival date & time: 09/28/23  1333     History Chief Complaint  Patient presents with   Abdominal Pain     HPI: Miranda Hill is a 58 y.o. female with history pertinent for OSA, GERD, malignancy with unknown primary currently undergoing workup who presents complaining of abdominal distention. Patient arrived via POV accompanied by husband.  History provided by patient and spouse/partner.  No interpreter required during this encounter.  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  Reports that that she has been undergoing outpatient workup including PET scan, CT, and diagnostic paracentesis.  Reports that she has had increasing abdominal girth and pressure for some time, and was ultimately diagnosed with malignancy.  Reports that on 8/27 she underwent diagnostic and therapeutic paracentesis with removal of approximately 4.7 L of fluid.  Reports that she felt much better after this paracentesis, however over the past several days fluid has rapidly reaccumulated, and she has abdominal distention and discomfort, and feels that she is unable to take a deep breath in due to her abdomen.  Denies any fever, chills, nausea, vomiting, diarrhea.  Denies abdominal pain, reports only sensation of distention and tightness.  Reports that she did not feel that she was able to wait until outpatient follow-up on 9/2, therefore she presented to emergency department for evaluation.  Patient notes that she typically is tachycardic when evaluated by physicians as she has whitecoat syndrome.  Patient's recorded medical, surgical, social, medication list and allergies were reviewed in the Snapshot window as part of the initial history.   Prior to Admission medications   Medication Sig Start Date End Date Taking? Authorizing Provider  amLODipine (NORVASC) 2.5 MG tablet  Take 2.5 mg by mouth daily.    [provider]  estradiol  (ESTRACE  VAGINAL) 0.1 MG/GM vaginal cream Apply 1/2 gram to vulva nightly for 2 weeks then decrease to 1/2 gram to vulva two nights a week. 09/01/23   Dallie Vera GAILS, MD     Allergies: Codeine and Tree extract   Review of Systems   ROS as per HPI  Physical Exam Updated Vital Signs BP 120/81   Pulse (!) 109   Temp 98.6 F (37 C) (Oral)   Resp 20   Ht 5' 2 (1.575 m)   Wt 88 kg   SpO2 94%   BMI 35.48 kg/m  Physical Exam Vitals and nursing note reviewed.  Constitutional:      General: She is not in acute distress.    Appearance: Normal appearance.  HENT:     Head: Normocephalic and atraumatic.  Eyes:     Extraocular Movements: Extraocular movements intact.  Cardiovascular:     Rate and Rhythm: Regular rhythm. Tachycardia present.     Pulses: Normal pulses.  Pulmonary:     Effort: Pulmonary effort is normal.  Abdominal:     General: Abdomen is protuberant. There is distension.     Palpations: There is shifting dullness and fluid wave.     Tenderness: There is no abdominal tenderness.  Skin:    General: Skin is warm and dry.     Capillary Refill: Capillary refill takes less than 2 seconds.  Neurological:     General: No focal deficit present.     Mental Status: She is alert and oriented to person, place, and time.     ED Course/ Medical Decision Making/  A&P    Procedures Paracentesis  Date/Time: 09/29/2023 2:49 PM  Performed by: Rogelia Jerilynn RAMAN, MD Authorized by: Rogelia Jerilynn RAMAN, MD   Consent:    Consent obtained:  Written   Consent given by:  Spouse and patient   Risks, benefits, and alternatives were discussed: yes     Risks discussed:  Bowel perforation, bleeding, infection and pain   Alternatives discussed:  No treatment, delayed treatment and observation Universal protocol:    Imaging studies available: yes     Site/side marked: yes     Immediately prior to procedure, a time out  was called: yes     Patient identity confirmed:  Arm band Pre-procedure details:    Procedure purpose:  Therapeutic   Preparation: Patient was prepped and draped in usual sterile fashion   Anesthesia:    Anesthesia method:  Local infiltration   Local anesthetic:  Lidocaine  1% w/o epi Procedure details:    Needle gauge:  18   Ultrasound guidance: yes     Puncture site:  R lower quadrant   Fluid removed amount:  ~2.6L   Fluid appearance:  Amber   Dressing:  Adhesive bandage Post-procedure details:    Procedure completion:  Tolerated well, no immediate complications    Medications Ordered in ED Medications - No data to display  Medical Decision Making:   Miranda Hill is a 58 y.o. female who presents for abdominal distention as per above.  Physical exam is pertinent for abdominal distention with fluid wave.   The differential includes but is not limited to ascites, malignant ascites, spontaneous bacterial peritonitis, sepsis.  Independent historian: Spouse/partner  External data reviewed: Notes: Reviewed patient's outpatient workup, patient is being worked up for primary ovarian versus GI malignancy, has recent large-volume paracentesis documented with multiple in process labs.  Additionally reviewed prior notes and vitals at prior visits, patient typically is tachycardic to the low 100s.  Labs: Ordered, Independent interpretation, and Details: CBC with slight nonspecific leukocytosis to 13.9 with left shift.  No anemia thrombocytopenia.  CMP without AKI, range electrolyte derangement, emergent LFT abnormality.  Magnesium mildly elevated at 2.5, coags WNL.  Radiology: Not indicated  EKG/Medicine tests: Not indicated EKG Interpretation:    Interventions: Lidocaine   See the EMR for full details regarding lab and imaging results.  Patient presents for reaccumulation of ascites.  This is most consistent with malignant ascites given patient's history.  Patient without fever,  abdominal pain, abdominal tenderness to palpation, therefore low index of suspicion for SBP.  Patient presenting primarily for therapeutic paracentesis.  Given patient large volume rapidly reaccumulating malignant ascites and unavailability of outpatient procedure given it is the weekend, I do not believe that therapeutic paracentesis is unreasonable.  Screening labs obtained to evaluate for underlying derangements that would preclude paracentesis such as significant electrolyte derangements, thrombocytopenia, abnormal coags, and labs reassuring.  Therefore procedure performed as per procedure note above, patient tolerated well with resolution of abdominal tightness and distention, and patient feels that she is able to take a deeper breath and is more comfortable.  Discussed need for close follow-up with outpatient providers and discussions with outpatient providers to create schedule for regular frequent outpatient paracenteses.  Presentation is most consistent with acute complicated illness and Current presentation is complicated by underlying chronic conditions  Discussion of management or test interpretations with external provider(s): Not indicated  Risk Drugs:Prescription drug management  Disposition: DISCHARGE: I believe that the patient is safe for discharge home with outpatient follow-up. Patient  was informed of all pertinent physical exam, laboratory, and imaging findings. Patient's suspected etiology of their symptom presentation was discussed with the patient and all questions were answered. We discussed following up with PCP and outpatient oncology team. I provided thorough ED return precautions. The patient feels safe and comfortable with this plan.  MDM generated using voice dictation software and may contain dictation errors.  Please contact me for any clarification or with any questions.  Clinical Impression:  1. Malignant ascites      Discharge   Final Clinical Impression(s) /  ED Diagnoses Final diagnoses:  Malignant ascites    Rx / DC Orders ED Discharge Orders     None        Rogelia Jerilynn RAMAN, MD 09/30/23 [phone]

## 2023-09-28 NOTE — ED Triage Notes (Signed)
 Patient to ED by POV with c/o excess fluid in ABD. She reports having a paracentesis on Wednesday and removing 4.7L. She voices she needs another paracentesis c/o ABD discomfort and tightness.

## 2023-09-28 NOTE — ED Provider Notes (Incomplete)
 [place] EMERGENCY DEPARTMENT AT [facility] Provider Note   CSN: [id number] Arrival date & time: 09/28/23  1333     History Chief Complaint  Patient presents with   Abdominal Pain     Abdominal Pain  HPI: Miranda Hill is a 58 y.o. female with *** pertinent *** who presents complaining of ***. Patient arrived via {LSARRIVAL:29392} *** History provided by {LSHISTORY:33428}.  {LSINTERPRETER:33419}  ***  Patient's recorded medical, surgical, social, medication list and allergies were reviewed in the Snapshot window as part of the initial history.   Prior to Admission medications   Medication Sig Start Date End Date Taking? Authorizing Provider  amLODipine (NORVASC) 2.5 MG tablet Take 2.5 mg by mouth daily.    [provider]  estradiol  (ESTRACE  VAGINAL) 0.1 MG/GM vaginal cream Apply 1/2 gram to vulva nightly for 2 weeks then decrease to 1/2 gram to vulva two nights a week. 09/01/23   Dallie Vera GAILS, MD     Allergies: Codeine and Tree extract   Review of Systems   ROS as per HPI  Physical Exam Updated Vital Signs BP (!) 140/86 (BP Location: Left Arm)   Pulse (!) 115   Temp 98.6 F (37 C) (Oral)   Resp 16   Ht 5' 2 (1.575 m)   Wt 88 kg   SpO2 97%   BMI 35.48 kg/m  Physical Exam  ED Course/ Medical Decision Making/ A&P    Procedures Procedures   Medications Ordered in ED Medications - No data to display  Medical Decision Making:   Miranda Hill is a 58 y.o. female who presents for ***as per above.  Physical exam is pertinent for ***.   The differential includes but is not limited to ***.  Independent historian: {LSHISTORIAN:33403}  External data reviewed: {LSEXTERNALDATA:33407}  Initial Plan:  ***  ***Screening labs including CBC and Metabolic panel to evaluate for infectious or metabolic etiology of disease.  ***Urinalysis with reflex culture ordered to evaluate for UTI or relevant urologic/nephrologic pathology.  ***CXR  to evaluate for structural/infectious intrathoracic pathology.  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 EXAM: NUCLEAR MEDICINE PET SKULL BASE TO THIGH TECHNIQUE: 9.4 mCi F-18 FDG was injected intravenously. Full-ring PET imaging was performed from the skull base to thigh after the radiotracer. CT data was obtained and used for attenuation correction and anatomic localization. Fasting blood glucose: 116 mg/dl COMPARISON:  CT scan 1/74/7974 FINDINGS: Mediastinal blood pool activity: SUV max 2.4 Liver activity: SUV max NA NECK: No significant abnormal hypermetabolic activity in this region. Incidental CT findings: None. CHEST: Small type 1 hiatal hernia, accompanied by a small amount of ascites, faint metabolic activity in the ascites with maximum SUV 2.3, cannot exclude malignant ascites extension up through the hiatus. Incidental CT findings: Mild scarring or subsegmental atelectasis anteriorly in the right upper lobe. ABDOMEN/PELVIS: Heavy burden of hypermetabolic omental caking with malignant ascites and numerous foci of hypermetabolic activity along the liver capsule, right paracolic gutter, left paracolic gutter, mesentery, and pelvic ascites. A representative region of the omental caking in the left lower quadrant anterior to the descending colon a maximum SUV of 14.6. A hypermetabolic tumor deposit in the pelvic ascites anterior to the rectum on image 171 series 4 has maximum SUV of  16.0 and measures 2.7 cm in long axis. Hypermetabolic focus along the posterosuperior liver margin maximum SUV 7.8. Isolation of primary site is problematic given the tumor deposits along the cecum and expected location  of the appendix as well as the adnexa. Overall moderate amount of malignant ascites. Incidental CT findings: None. SKELETON: No significant abnormal hypermetabolic activity in this region. Incidental CT findings: None. IMPRESSION: 1. Heavy burden of hypermetabolic omental caking with malignant ascites and numerous foci of hypermetabolic activity along the liver capsule, right paracolic gutter, left paracolic gutter, mesentery, and pelvic ascites. Isolation of primary site is problematic given the tumor deposits along the cecum and expected location of the appendix as well as the adnexa. Overall moderate amount of malignant ascites. 2. Small type 1 hiatal hernia, accompanied by a small amount of ascites, faint metabolic activity in the ascites with maximum SUV 2.3, cannot exclude malignant ascites extension up through the hiatus. Electronically Signed   By: Ryan Salvage M.D.   On: 09/25/2023 16:59   US  Paracentesis Result Date: 09/24/2023 INDICATION: other ascites Patient with history of bloating, abdominal distension, imaging findings of extensive omental and peritoneal nodularity, ascites; request received for diagnostic and therapeutic paracentesis. EXAM: ULTRASOUND GUIDED DIAGNOSTIC AND THERAPEUTIC PARACENTESIS MEDICATIONS: 8 mL 1% lidocaine  with epinephrine  COMPLICATIONS: None immediate. PROCEDURE: Informed written consent was obtained from the patient after a discussion of the risks, benefits and alternatives to treatment. A timeout was performed prior to the initiation of the procedure. Initial ultrasound scanning demonstrates a large amount of ascites within the LEFT lower abdominal quadrant. The left lower abdomen was prepped and draped in the usual sterile fashion. 1% lidocaine  was used for local anesthesia. Following this, a 6 Fr Safe-T-Centesis catheter was introduced. An ultrasound image was saved for documentation purposes. The paracentesis was performed. The catheter was removed and a dressing  was applied. The patient tolerated the procedure well without immediate post procedural complication. FINDINGS: A total of approximately 4.7 L of hazy, blood-tinged fluid was removed. Samples were sent to the laboratory as requested by the clinical team. IMPRESSION: Successful ultrasound-guided diagnostic and therapeutic paracentesis yielding 4.7 L of peritoneal fluid. Performed by: Franky Rakers, PA-C Electronically Signed   By: Thom Hall M.D.   On: 09/24/2023 17:10   CT ABDOMEN PELVIS W CONTRAST Result Date: 09/22/2023 CLINICAL DATA:  Bloating.  Distended abdomen. * Tracking Code: BO * EXAM: CT ABDOMEN AND PELVIS WITH CONTRAST TECHNIQUE: Multidetector CT imaging of the abdomen and pelvis was performed using the standard protocol following bolus administration of intravenous contrast. RADIATION DOSE REDUCTION: This exam was performed according to the departmental dose-optimization program which includes automated exposure control, adjustment of the mA and/or kV according to patient size and/or use of iterative reconstruction technique. CONTRAST:  100mL ISOVUE -300 IOPAMIDOL  (ISOVUE -300) INJECTION 61% COMPARISON:  None Available. FINDINGS: Lower chest: Small focus of atelectasis in the inferior lingula. No pulmonary nodules. Hepatobiliary: No focal hepatic lesion. Low-density gallstone noted. No sickle size. Pancreas: Pancreas is normal. No ductal dilatation. No pancreatic inflammation. Spleen: Normal spleen Adrenals/urinary tract: Adrenal glands and kidneys are normal. The ureters and bladder normal. Stomach/Bowel: Stomach is normal. The small bowel is floating on moderate volume intraperitoneal free fluid within the small bowel mesentery. No bowel obstruction. Terminal ileum is normal. Appendix not clearly identified. The colon is normal. Contrast travels the entirety of the colon to the rectum. Vascular/Lymphatic: Abdominal aorta is normal caliber. No periportal or retroperitoneal adenopathy. No pelvic  adenopathy. Reproductive: Uterus and ovaries are  grossly normal. Other: Moderate volume intraperitoneal free fluid. There is extensive nodularity the greater omentum (omental caking, image 58/2 for example). There are peritoneal nodules noted. For example nodule adjacent the RIGHT hepatic lobe measuring 13 mm image 35/2. Peritoneal nodularity in the posterior cul-de-sac on image 84/2. Musculoskeletal: No aggressive osseous lesion. IMPRESSION: 1. 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 2. Moderate volume intraperitoneal free fluid. 3. No clear ovarian or appendiceal neoplasm. No clear primary malignancy identified. These results will be called to the ordering clinician or representative by the Radiologist Assistant, and communication documented in the PACS or [employer]. Electronically Signed   By: Jackquline Boxer M.D.   On: 09/22/2023 10:36    EKG/Medicine tests: {LSEKG:33414} EKG Interpretation:    Decision rules:  {Document cardiac monitor, telemetry assessment procedure when appropriate:1} {   Click here for ABCD2, HEART and other calculatorsREFRESH Note before signing :1}   {Document critical care time when appropriate:1} {Document review of labs and clinical decision tools ie heart score, Chads2Vasc2 etc:1}  {Document your independent review of radiology images, and any outside records:1} {Document your discussion with family members, caretakers, and with consultants:1} {Document social determinants of health affecting pt's care:1} {Document your decision making why or why not admission, treatments were needed:1}                Interventions:***   See the EMR for full details regarding lab and imaging results.  ***  {LSCOPA:33420}  Discussion of management or test interpretations with external provider(s): ***  Risk Drugs:{LSDRUGS:33399} Treatment: {LSTREATMENT:33409} Surgery:{LSSURGERY:33410} Critical Care:  ***  Disposition: {LSDISPO:33388}  MDM generated using voice dictation software and may contain dictation errors.  Please contact me for any clarification or with any questions.  Clinical Impression: No diagnosis found.   Data Unavailable   Final Clinical Impression(s) / ED Diagnoses Final diagnoses:  None    Rx / DC Orders ED Discharge Orders     None

## 2023-09-28 NOTE — Discharge Instructions (Signed)
 Miranda Hill  Thank you for allowing us  to take care of you today.  You came to the Emergency Department today because you have recently unfortunately been diagnosed with cancer and you are undergoing workup.  You have fluid collecting in your belly, and you recently had it drained, however you had recurrence of the fluid in your belly since your last drainage and it was making it more difficult to breathe.  Your labs here were reassuring.  Your heart rate is bit elevated, however you usually have a slightly elevated heart rate on your prior visits.  We drained 2.5 L from your belly.  We recommend following up with your doctor on Tuesday, you likely will need to schedule regular paracentesis to control your fluid accumulation.  We recommend discussing with your primary doctor to arrange a regimen.  To-Do: 1. Please follow-up with your primary doctor within 2 days / as soon as possible.    Please return to the Emergency Department or call 911 if you experience have worsening of your symptoms, or do not get better, chest pain, shortness of breath, severe or significantly worsening pain, high fever, severe confusion, pass out or have any reason to think that you need emergency medical care.   We hope you feel better soon.   Mitzie Later, MD Department of Emergency Medicine [facility]

## 2023-09-30 ENCOUNTER — Telehealth: Payer: Self-pay

## 2023-09-30 ENCOUNTER — Other Ambulatory Visit (HOSPITAL_COMMUNITY): Payer: Self-pay | Admitting: Gastroenterology

## 2023-09-30 DIAGNOSIS — R188 Other ascites: Secondary | ICD-10-CM

## 2023-09-30 NOTE — Telephone Encounter (Signed)
 9/2 @ 9:44 am Received call from patient's spouse to get patient sch with Radiation Oncologist.  We have not received a referral for this patient.  Patient's spouse will follow up with referring office to fax referral to our dept.

## 2023-09-30 NOTE — Progress Notes (Unsigned)
 Jenna Cordella LABOR, MD  Baldwin Rosella L PROCEDURE / BIOPSY REVIEW Date: 09/30/23  Requested Biopsy site: abdomen Reason for request: carcinomatosis Imaging review: Best seen on CT   Decision: Approved Imaging modality to perform: CT Schedule with: Moderate Sedation Schedule for: Any VIR  Additional comments: @Schedulers .   Please contact me with questions, concerns, or if issue pertaining to this request arise.  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

## 2023-10-01 ENCOUNTER — Telehealth: Payer: Self-pay | Admitting: Oncology

## 2023-10-01 ENCOUNTER — Other Ambulatory Visit (HOSPITAL_COMMUNITY): Payer: Self-pay | Admitting: Gastroenterology

## 2023-10-01 ENCOUNTER — Ambulatory Visit (HOSPITAL_COMMUNITY)
Admission: RE | Admit: 2023-10-01 | Discharge: 2023-10-01 | Disposition: A | Discharge status: Home / Self Care | Source: Ambulatory Visit | Attending: Gastroenterology | Admitting: Gastroenterology

## 2023-10-01 ENCOUNTER — Telehealth: Payer: Self-pay

## 2023-10-01 ENCOUNTER — Ambulatory Visit (HOSPITAL_COMMUNITY)

## 2023-10-01 ENCOUNTER — Encounter (HOSPITAL_COMMUNITY): Payer: Self-pay

## 2023-10-01 DIAGNOSIS — R180 Malignant ascites: Secondary | ICD-10-CM | POA: Diagnosis not present

## 2023-10-01 DIAGNOSIS — R188 Other ascites: Secondary | ICD-10-CM

## 2023-10-01 DIAGNOSIS — C801 Malignant (primary) neoplasm, unspecified: Secondary | ICD-10-CM | POA: Diagnosis not present

## 2023-10-01 HISTORY — PX: IR PARACENTESIS: IMG2679

## 2023-10-01 MED ORDER — LIDOCAINE-EPINEPHRINE 1 %-1:100000 IJ SOLN
INTRAMUSCULAR | Status: AC
  Filled 2023-10-01: qty 1

## 2023-10-01 NOTE — Telephone Encounter (Signed)
 Attempted to add cytology on to paracentesis from this morning with [facility].  They were unable to add it on because fluid was not available.

## 2023-10-01 NOTE — Telephone Encounter (Signed)
 Called Miranda Hill and notified her that CT biopsy is now scheduled for 10/08/2023 with 9 am arrival at [facility]  She verbalized understanding of the appointment.  Also explained that we are working on getting her scheduled with medical oncology and will call her soon with an appointment.

## 2023-10-01 NOTE — Telephone Encounter (Signed)
 Received phone call from patient's husband, Elna, and he was very concerned about his wife due to her recent diagnosis of cancer. It is unknown if she has GI or GYN cancer and she is scheduled for a CT biopsy on Monday 9/10. Myles Elna that once the results came back from the CT biopsy, we would be able to establish care for her here with the correct oncologist based on her diagnosis. He was understanding and appreciative of the call. He requested that she get in by next Thursday or Friday if at all possible. Andrea CHRISTELLA Plunk, RN

## 2023-10-01 NOTE — Procedures (Signed)
 PROCEDURE SUMMARY:  Successful image-guided paracentesis from the right upper abdomen.  Yielded 3.2 liters of serosanguineous fluid.  No immediate complications.  EBL < 1 mL Patient tolerated well.   Specimen was not sent for labs.  Please see imaging section of Epic for full dictation.  Clotilda DELENA Hesselbach PA-C 10/01/2023 10:21 AM

## 2023-10-02 ENCOUNTER — Telehealth: Payer: Self-pay | Admitting: Oncology

## 2023-10-02 NOTE — Telephone Encounter (Signed)
 Called Miranda Hill and also spoke to her husband, Elna.  Scheduled new patient appointment with Dr. Lonn tomorrow at 2:00 with arrival to the cancer center at 1:30 to check in.  They verbalized understanding and agreement of the appointment.

## 2023-10-03 ENCOUNTER — Encounter: Payer: Self-pay | Admitting: Hematology and Oncology

## 2023-10-03 ENCOUNTER — Inpatient Hospital Stay (HOSPITAL_BASED_OUTPATIENT_CLINIC_OR_DEPARTMENT_OTHER): Admitting: Hematology and Oncology

## 2023-10-03 ENCOUNTER — Inpatient Hospital Stay: Attending: Hematology and Oncology

## 2023-10-03 ENCOUNTER — Other Ambulatory Visit (HOSPITAL_COMMUNITY): Payer: Self-pay

## 2023-10-03 ENCOUNTER — Inpatient Hospital Stay

## 2023-10-03 ENCOUNTER — Other Ambulatory Visit: Payer: Self-pay

## 2023-10-03 VITALS — BP 127/69 | HR 96 | Resp 16

## 2023-10-03 VITALS — BP 117/88 | HR 115 | Temp 98.3°F | Resp 18 | Ht 62.0 in | Wt 172.7 lb

## 2023-10-03 DIAGNOSIS — E871 Hypo-osmolality and hyponatremia: Secondary | ICD-10-CM | POA: Insufficient documentation

## 2023-10-03 DIAGNOSIS — E860 Dehydration: Secondary | ICD-10-CM | POA: Diagnosis not present

## 2023-10-03 DIAGNOSIS — C786 Secondary malignant neoplasm of retroperitoneum and peritoneum: Secondary | ICD-10-CM

## 2023-10-03 DIAGNOSIS — R971 Elevated cancer antigen 125 [CA 125]: Secondary | ICD-10-CM | POA: Insufficient documentation

## 2023-10-03 DIAGNOSIS — Z801 Family history of malignant neoplasm of trachea, bronchus and lung: Secondary | ICD-10-CM | POA: Insufficient documentation

## 2023-10-03 DIAGNOSIS — Z808 Family history of malignant neoplasm of other organs or systems: Secondary | ICD-10-CM | POA: Insufficient documentation

## 2023-10-03 DIAGNOSIS — C801 Malignant (primary) neoplasm, unspecified: Secondary | ICD-10-CM | POA: Diagnosis not present

## 2023-10-03 DIAGNOSIS — R110 Nausea: Secondary | ICD-10-CM | POA: Diagnosis not present

## 2023-10-03 DIAGNOSIS — Z800 Family history of malignant neoplasm of digestive organs: Secondary | ICD-10-CM | POA: Insufficient documentation

## 2023-10-03 DIAGNOSIS — D61818 Other pancytopenia: Secondary | ICD-10-CM | POA: Diagnosis not present

## 2023-10-03 DIAGNOSIS — Z8049 Family history of malignant neoplasm of other genital organs: Secondary | ICD-10-CM | POA: Diagnosis not present

## 2023-10-03 DIAGNOSIS — R531 Weakness: Secondary | ICD-10-CM | POA: Insufficient documentation

## 2023-10-03 DIAGNOSIS — R180 Malignant ascites: Secondary | ICD-10-CM

## 2023-10-03 DIAGNOSIS — R000 Tachycardia, unspecified: Secondary | ICD-10-CM

## 2023-10-03 DIAGNOSIS — C569 Malignant neoplasm of unspecified ovary: Secondary | ICD-10-CM | POA: Insufficient documentation

## 2023-10-03 LAB — CBC WITH DIFFERENTIAL (CANCER CENTER ONLY)
Abs Immature Granulocytes: 0.16 K/uL — ABNORMAL HIGH (ref 0.00–0.07)
Basophils Absolute: 0.1 K/uL (ref 0.0–0.1)
Basophils Relative: 0 %
Eosinophils Absolute: 0 K/uL (ref 0.0–0.5)
Eosinophils Relative: 0 %
HCT: 42.5 % (ref 36.0–46.0)
Hemoglobin: 14.2 g/dL (ref 12.0–15.0)
Immature Granulocytes: 1 %
Lymphocytes Relative: 7 %
Lymphs Abs: 1.4 K/uL (ref 0.7–4.0)
MCH: 27.7 pg (ref 26.0–34.0)
MCHC: 33.4 g/dL (ref 30.0–36.0)
MCV: 82.8 fL (ref 80.0–100.0)
Monocytes Absolute: 1.2 K/uL — ABNORMAL HIGH (ref 0.1–1.0)
Monocytes Relative: 6 %
Neutro Abs: 17.3 K/uL — ABNORMAL HIGH (ref 1.7–7.7)
Neutrophils Relative %: 86 %
Platelet Count: 448 K/uL — ABNORMAL HIGH (ref 150–400)
RBC: 5.13 MIL/uL — ABNORMAL HIGH (ref 3.87–5.11)
RDW: 13.1 % (ref 11.5–15.5)
WBC Count: 20.1 K/uL — ABNORMAL HIGH (ref 4.0–10.5)
nRBC: 0 % (ref 0.0–0.2)

## 2023-10-03 LAB — LACTATE DEHYDROGENASE: LDH: 205 U/L — ABNORMAL HIGH (ref 98–192)

## 2023-10-03 LAB — CMP (CANCER CENTER ONLY)
ALT: 8 U/L (ref 0–44)
AST: 13 U/L — ABNORMAL LOW (ref 15–41)
Albumin: 3.2 g/dL — ABNORMAL LOW (ref 3.5–5.0)
Alkaline Phosphatase: 83 U/L (ref 38–126)
Anion gap: 8 (ref 5–15)
BUN: 26 mg/dL — ABNORMAL HIGH (ref 6–20)
CO2: 24 mmol/L (ref 22–32)
Calcium: 11.3 mg/dL — ABNORMAL HIGH (ref 8.9–10.3)
Chloride: 97 mmol/L — ABNORMAL LOW (ref 98–111)
Creatinine: 0.78 mg/dL (ref 0.44–1.00)
GFR, Estimated: >60 mL/min (ref >60)
Glucose, Bld: 212 mg/dL — ABNORMAL HIGH (ref 70–99)
Potassium: 4.7 mmol/L (ref 3.5–5.1)
Sodium: 129 mmol/L — ABNORMAL LOW (ref 135–145)
Total Bilirubin: 0.5 mg/dL (ref 0.0–1.2)
Total Protein: 6.5 g/dL (ref 6.5–8.1)

## 2023-10-03 LAB — CEA (ACCESS): CEA (CHCC): <1 ng/mL (ref 0.00–5.00)

## 2023-10-03 MED ORDER — ONDANSETRON HCL 8 MG PO TABS
8.0000 mg | ORAL_TABLET | Freq: Three times a day (TID) | ORAL | 1 refills | Status: AC | PRN
  Filled 2023-10-03: qty 18, 21d supply, fill #0
  Filled 2023-10-03: qty 60, 20d supply, fill #0
  Filled 2023-12-04: qty 60, 20d supply, fill #1

## 2023-10-03 MED ORDER — SODIUM CHLORIDE 0.9 % IV SOLN: Freq: Once | INTRAVENOUS | Status: DC

## 2023-10-03 MED ORDER — SODIUM CHLORIDE 0.9 % IV SOLN: Freq: Once | INTRAVENOUS | Status: AC

## 2023-10-03 MED ORDER — ONDANSETRON HCL 4 MG/2ML IJ SOLN
8.0000 mg | Freq: Once | INTRAMUSCULAR | Status: AC
  Administered 2023-10-03: 8 mg via INTRAVENOUS
  Filled 2023-10-03: qty 4

## 2023-10-03 NOTE — Assessment & Plan Note (Addendum)
 Repeat labs today show hyponatremia and slight hypercalcemia likely due to dehydration She is also tachycardic on exam I recommend IV fluid support today I gave the patient and family a flowsheet to document her oral intake and recommend frequent small meals and adequate hydration I also emphasized importance of adequate protein intake

## 2023-10-03 NOTE — Assessment & Plan Note (Signed)
 She has significant reaccumulation of malignant ascites She will proceed with paracentesis on Monday as scheduled

## 2023-10-03 NOTE — Patient Instructions (Signed)

## 2023-10-03 NOTE — Assessment & Plan Note (Signed)
 I recommend trial of ODT Zofran  to use as needed

## 2023-10-03 NOTE — Assessment & Plan Note (Signed)
 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

## 2023-10-03 NOTE — Assessment & Plan Note (Signed)
 I have reviewed multiple imaging studies with the patient and her son The patient has developed malignant ascites and abdominal carcinomatosis, source is unknown Her gastroenterologist will arrange for EGD and colonoscopy CT-guided biopsy of peritoneal disease is scheduled for next week I will order additional labs including tumor markers We discussed limitations of tumor markers today

## 2023-10-03 NOTE — Progress Notes (Signed)
 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[facility]) I have reviewed multiple imaging studies with the patient and her son The patient has developed malignant ascites and abdominal carcinomatosis, source is unknown Her gastroenterologist will arrange for EGD and colonoscopy CT-guided biopsy of peritoneal disease is scheduled for next week I will order additional labs including tumor markers We discussed limitations of tumor markers today  Malignant ascites She has significant reaccumulation of malignant ascites She will proceed with paracentesis on Monday as scheduled  Generalized weakness The patient is grossly dehydrated She also 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 I  completed a form for her for application for disability parking placard  Dehydration Repeat labs today show hyponatremia and slight hypercalcemia likely due to dehydration She is also tachycardic on exam I recommend IV fluid support today I gave the patient and family a flowsheet to document her oral intake and recommend frequent small meals and adequate hydration I also emphasized importance of adequate protein intake  Nausea without vomiting I recommend trial of ODT Zofran  to use as needed  Orders Placed This Encounter  Procedures   IR IMAGING GUIDED PORT INSERTION    Standing Status:   Future    Expected Date:   10/10/2023    Expiration Date:   10/02/2024    Reason for Exam (SYMPTOM  OR DIAGNOSIS REQUIRED):   need chemo for port    Preferred Imaging Location?:   [facility]   CBC with Differential (Cancer Center Only)    Standing Status:   Future    Number of Occurrences:   1    Expiration Date:   10/02/2024   CMP (Cancer Center only)    Standing Status:   Future    Number of Occurrences:   1    Expiration Date:   10/02/2024   Lactate dehydrogenase     Standing Status:   Future    Number of Occurrences:   1    Expiration Date:   10/02/2024   Cancer antigen 19-9    Standing Status:   Standing    Number of Occurrences:   12    Expiration Date:   10/02/2024   CA 125    Standing Status:   Standing    Number of Occurrences:   11    Expiration Date:   10/02/2024   CEA (IN HOUSE-[facility])    Standing Status:   Future    Number of Occurrences:   1    Expiration Date:   10/02/2024    The total time spent in the appointment was 80 minutes encounter with patients including review of chart and various tests results, discussions about plan of care and coordination of care plan   All questions were answered. The patient knows to call the clinic with any problems, questions or concerns. No barriers to learning was detected.  Almarie Bedford, MD 9/5/20253:03 PM  CHIEF COMPLAINTS/PURPOSE OF CONSULTATION:  Diffuse peritoneal carcinomatosis with malignant ascites  HISTORY OF PRESENTING ILLNESS:  Miranda Hill 58 y.o. female is here because of recent findings of malignancy.  She is here accompanied by her husband, Elna and her son, Augustin She is a Manufacturing systems engineer She has not been feeling well since the beginning of August She saw her gynecologist complaining of abdominal distention.  She underwent Pap smear that came back negative for malignancy The  patient have history of uterine prolapse and have bladder sling procedure many years ago.  She started to have urinary difficulties approximately last month. She has significant abdominal distention, nausea/burping, early satiety and passage of loose stools.  She denies abnormal postmenopausal bleeding Of note, she has strong family history of cancer Her mother was diagnosed with colon cancer in her 14s, still alive Her maternal grandmother had uterine cancer Her last colonoscopy was in 2023, polyps were removed and she is due for repeat colonoscopy next year  She underwent multiple imaging studies and 3 courses  of paracentesis, last session of paracentesis was 4 days ago She is not eating or hydrating enough according to her husband She never has sensation of tenesmus or passage of blood in her stool She usually has 3 bowel movement daily  She complained of generalized weakness and gait imbalance recently  I have reviewed her chart and materials related to her cancer extensively and collaborated history with the patient. 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 Request for therapeutic paracentesis. EXAM: ULTRASOUND GUIDED THERAPEUTIC PARACENTESIS MEDICATIONS: 6 mL 1% lidocaine  COMPLICATIONS: None immediate. PROCEDURE: Informed written consent was obtained from the patient after a discussion of the risks, benefits and alternatives to treatment. A timeout was performed prior to the initiation of the procedure. Initial ultrasound scanning demonstrates a large amount of ascites within the right lower abdominal quadrant. The right lower abdomen was prepped and draped in the usual sterile fashion. 1% lidocaine  was used for local anesthesia. Following this, a 19 gauge, 7-cm, Yueh catheter was introduced. An ultrasound image was saved for documentation purposes. The paracentesis was performed. The catheter was removed and a dressing was applied. The patient tolerated the procedure well without immediate post procedural complication. FINDINGS: A total of approximately 3.2 L of clear, amber fluid was removed. IMPRESSION: Successful ultrasound-guided paracentesis yielding 3.2 liters of peritoneal fluid. Performed by Clotilda Hesselbach, PA-C Electronically Signed   By: Ester Sides M.D.   On: 10/01/2023 12:46   NM PET Image  Initial (PI) Skull Base To Thigh (F-18 FDG) Result Date: 09/25/2023 CLINICAL DATA:  Initial treatment strategy for omental and peritoneal nodularity compatible with carcinomatosis. EXAM: NUCLEAR MEDICINE PET SKULL BASE TO THIGH TECHNIQUE: 9.4 mCi F-18 FDG was injected intravenously. Full-ring PET imaging was performed from the skull base to thigh after the radiotracer. CT data was obtained and used for attenuation correction and anatomic localization. Fasting blood glucose: 116 mg/dl COMPARISON:  CT scan 1/74/7974 FINDINGS: Mediastinal blood pool activity: SUV max 2.4 Liver activity: SUV max NA NECK: No significant abnormal hypermetabolic activity in this region. Incidental CT findings: None. CHEST: Small type 1 hiatal hernia, accompanied by a small amount of ascites, faint metabolic activity in the ascites with maximum SUV 2.3, cannot exclude malignant ascites extension up through the hiatus. Incidental CT findings: Mild scarring or subsegmental atelectasis anteriorly in the right upper lobe. ABDOMEN/PELVIS: Heavy burden of hypermetabolic omental caking with malignant ascites and numerous foci of hypermetabolic activity along the liver capsule, right paracolic gutter, left paracolic gutter, mesentery, and pelvic ascites. A representative region of the omental caking in the left lower quadrant anterior to the descending colon a maximum SUV of 14.6. A hypermetabolic tumor deposit in the pelvic ascites  anterior to the rectum on image 171 series 4 has maximum SUV of 16.0 and measures 2.7 cm in long axis. Hypermetabolic focus along the posterosuperior liver margin maximum SUV 7.8. Isolation of primary site is problematic given the tumor deposits along the cecum and expected location of the appendix as well as the adnexa. Overall moderate amount of malignant ascites. Incidental CT findings: None. SKELETON: No significant abnormal hypermetabolic activity in this region. Incidental CT findings: None. IMPRESSION: 1. Heavy  burden of hypermetabolic omental caking with malignant ascites and numerous foci of hypermetabolic activity along the liver capsule, right paracolic gutter, left paracolic gutter, mesentery, and pelvic ascites. Isolation of primary site is problematic given the tumor deposits along the cecum and expected location of the appendix as well as the adnexa. Overall moderate amount of malignant ascites. 2. Small type 1 hiatal hernia, accompanied by a small amount of ascites, faint metabolic activity in the ascites with maximum SUV 2.3, cannot exclude malignant ascites extension up through the hiatus. Electronically Signed   By: Ryan Salvage M.D.   On: 09/25/2023 16:59   US  Paracentesis Result Date: 09/24/2023 INDICATION: other ascites Patient with history of bloating, abdominal distension, imaging findings of extensive omental and peritoneal nodularity, ascites; request received for diagnostic and therapeutic paracentesis. EXAM: ULTRASOUND GUIDED DIAGNOSTIC AND THERAPEUTIC PARACENTESIS MEDICATIONS: 8 mL 1% lidocaine  with epinephrine  COMPLICATIONS: None immediate. PROCEDURE: Informed written consent was obtained from the patient after a discussion of the risks, benefits and alternatives to treatment. A timeout was performed prior to the initiation of the procedure. Initial ultrasound scanning demonstrates a large amount of ascites within the LEFT lower abdominal quadrant. The left lower abdomen was prepped and draped in the usual sterile fashion. 1% lidocaine  was used for local anesthesia. Following this, a 6 Fr Safe-T-Centesis catheter was introduced. An ultrasound image was saved for documentation purposes. The paracentesis was performed. The catheter was removed and a dressing was applied. The patient tolerated the procedure well without immediate post procedural complication. FINDINGS: A total of approximately 4.7 L of hazy, blood-tinged fluid was removed. Samples were sent to the laboratory as requested by the  clinical team. IMPRESSION: Successful ultrasound-guided diagnostic and therapeutic paracentesis yielding 4.7 L of peritoneal fluid. Performed by: Franky Rakers, PA-C Electronically Signed   By: Thom Hall M.D.   On: 09/24/2023 17:10   CT ABDOMEN PELVIS W CONTRAST Result Date: 09/22/2023 CLINICAL DATA:  Bloating.  Distended abdomen. * Tracking Code: BO * EXAM: CT ABDOMEN AND PELVIS WITH CONTRAST TECHNIQUE: Multidetector CT imaging of the abdomen and pelvis was performed using the standard protocol following bolus administration of intravenous contrast. RADIATION DOSE REDUCTION: This exam was performed according to the departmental dose-optimization program which includes automated exposure control, adjustment of the mA and/or kV according to patient size and/or use of iterative reconstruction technique. CONTRAST:  100mL ISOVUE -300 IOPAMIDOL  (ISOVUE -300) INJECTION 61% COMPARISON:  None Available. FINDINGS: Lower chest: Small focus of atelectasis in the inferior lingula. No pulmonary nodules. Hepatobiliary: No focal hepatic lesion. Low-density gallstone noted. No sickle size. Pancreas: Pancreas is normal. No ductal dilatation. No pancreatic inflammation. Spleen: Normal spleen Adrenals/urinary tract: Adrenal glands and kidneys are normal. The ureters and bladder normal. Stomach/Bowel: Stomach is normal. The small bowel is floating on moderate volume intraperitoneal free fluid within the small bowel mesentery. No bowel obstruction. Terminal ileum is normal. Appendix not clearly identified. The colon is normal. Contrast travels the entirety of the colon to the rectum. Vascular/Lymphatic: Abdominal aorta is normal caliber. No  periportal or retroperitoneal adenopathy. No pelvic adenopathy. Reproductive: Uterus and ovaries are grossly normal. Other: Moderate volume intraperitoneal free fluid. There is extensive nodularity the greater omentum (omental caking, image 58/2 for example). There are peritoneal nodules noted.  For example nodule adjacent the RIGHT hepatic lobe measuring 13 mm image 35/2. Peritoneal nodularity in the posterior cul-de-sac on image 84/2. Musculoskeletal: No aggressive osseous lesion. IMPRESSION: 1. 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 2. Moderate volume intraperitoneal free fluid. 3. No clear ovarian or appendiceal neoplasm. No clear primary malignancy identified. These results will be called to the ordering clinician or representative by the Radiologist Assistant, and communication documented in the PACS or [employer]. Electronically Signed   By: Jackquline Boxer M.D.   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[facility])     MEDICAL HISTORY:  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   History of chlamydia    1990   Multiple thyroid  nodules    Prediabetes    Sleep apnea     SURGICAL HISTORY: Past Surgical History:  Procedure Laterality Date   INTRAUTERINE DEVICE INSERTION  07/18/2008   MIRENA   IR PARACENTESIS  10/01/2023   SALIVARY GLAND SURGERY     ABSCESS   URETHRAL SLING  1999   WREN    SOCIAL HISTORY: Social History   Socioeconomic History   Marital status: Married    Spouse name: Not on file   Number of children: Not on file   Years of education: Not on file   Highest education level: Not on file  Occupational History   Not on file  Tobacco Use   Smoking status: Never   Smokeless tobacco: Never  Vaping Use   Vaping status: Never Used  Substance and Sexual Activity   Alcohol use: Not Currently   Drug use: No   Sexual activity: Yes    Birth control/protection: Post-menopausal    Comment: husband with vasectomy  Other Topics Concern   Not on file  Social History Narrative    Not on file   Social Drivers of Health   Financial Resource Strain: Not on file  Food Insecurity: No Food Insecurity (10/03/2023)   Hunger Vital Sign    Worried About Running Out of Food in the Last Year: Never true    Ran Out of Food in the Last Year: Never true  Transportation Needs: No Transportation Needs (10/03/2023)   PRAPARE - Administrator, Civil Service (Medical): No    Lack of Transportation (Non-Medical): No  Physical Activity: Not on file  Stress: Not on file  Social Connections: Not on file  Intimate Partner Violence: Not At Risk (10/03/2023)   Humiliation, Afraid, Rape, and Kick questionnaire    Fear of Current or Ex-Partner: No    Emotionally Abused: No    Physically Abused: No    Sexually Abused: No    FAMILY HISTORY: Family History  Problem Relation Age of Onset   Hypertension Mother    Diabetes Mother    Colon polyps Mother    Colon cancer Mother    Hypertension Father    Cancer Father        lung and brain   Diabetes Father    Other Sister  hypoglycemic   Colon polyps Sister    Depression Brother    Cancer Maternal Grandmother        UTERINE    ALLERGIES:  is allergic to codeine and tree extract.  MEDICATIONS:  Current Outpatient Medications  Medication Sig Dispense Refill   ondansetron  (ZOFRAN ) 8 MG tablet Take 1 tablet (8 mg total) by mouth every 8 (eight) hours as needed for nausea or vomiting. 60 tablet 1   estradiol  (ESTRACE  VAGINAL) 0.1 MG/GM vaginal cream Apply 1/2 gram to vulva nightly for 2 weeks then decrease to 1/2 gram to vulva two nights a week. 42.5 g 1   No current facility-administered medications for this visit.   Facility-Administered Medications Ordered in Other Visits  Medication Dose Route Frequency Provider Last Rate Last Admin   0.9 %  sodium chloride  infusion   Intravenous Once Lonn, Ernie Kasler, MD       ondansetron  (ZOFRAN ) injection 8 mg  8 mg Intravenous Once Izzak Fries, MD        REVIEW OF SYSTEMS:  All  other systems were reviewed with the patient and are negative.  PHYSICAL EXAMINATION: ECOG PERFORMANCE STATUS: 2 - Symptomatic, <50% confined to bed  Vitals:   10/03/23 1255  BP: 117/88  Pulse: (!) 115  Resp: 18  Temp: 98.3 F (36.8 C)  SpO2: 99%   Filed Weights   10/03/23 1255  Weight: 172 lb 11.2 oz (78.3 kg)    GENERAL:alert, no distress and comfortable.  She is ill-appearing SKIN: skin color, texture, turgor are normal, no rashes or significant lesions EYES: normal, conjunctiva are pink and non-injected, sclera clear OROPHARYNX:no exudate, no erythema and lips, buccal mucosa, and tongue normal  NECK: supple, thyroid  normal size, non-tender, without nodularity LYMPH:  no palpable lymphadenopathy in the cervical, axillary or inguinal LUNGS: clear to auscultation and percussion with normal breathing effort HEART: Tachycardia, no murmurs and no lower extremity edema ABDOMEN:abdomen grossly distended with ascites.  Reduced bowel sounds throughout Musculoskeletal:no cyanosis of digits and no clubbing  PSYCH: alert & oriented x 3 with fluent speech NEURO: no focal motor/sensory deficits although she appears to have generalized weakness and gait imbalance because of her weakness  LABORATORY DATA:  I have reviewed the data as listed Lab Results  Component Value Date   WBC 20.1 (H) 10/03/2023   HGB 14.2 10/03/2023   HCT 42.5 10/03/2023   MCV 82.8 10/03/2023   PLT 448 (H) 10/03/2023   Recent Labs    09/28/23 1618 10/03/23 1353  NA 135 129*  K 4.5 4.7  CL 100 97*  CO2 23 24  GLUCOSE 148* 212*  BUN 16 26*  CREATININE 0.69 0.78  CALCIUM 10.6* 11.3*  GFRNONAA >60 >60  PROT 6.3* 6.5  ALBUMIN 3.3* 3.2*  AST 18 13*  ALT 10 8  ALKPHOS 86 83  BILITOT 0.5 0.5    RADIOGRAPHIC STUDIES: I have personally reviewed the radiological images as listed and agreed with the findings in the report. IR Paracentesis Result Date: 10/01/2023 INDICATION: Patient with recently diagnosed  metastatic cancer of unknown primary with recurrent malignant ascites. Request for therapeutic paracentesis. EXAM: ULTRASOUND GUIDED THERAPEUTIC PARACENTESIS MEDICATIONS: 6 mL 1% lidocaine  COMPLICATIONS: None immediate. PROCEDURE: Informed written consent was obtained from the patient after a discussion of the risks, benefits and alternatives to treatment. A timeout was performed prior to the initiation of the procedure. Initial ultrasound scanning demonstrates a large amount of ascites within the right lower abdominal quadrant. The right lower abdomen was  prepped and draped in the usual sterile fashion. 1% lidocaine  was used for local anesthesia. Following this, a 19 gauge, 7-cm, Yueh catheter was introduced. An ultrasound image was saved for documentation purposes. The paracentesis was performed. The catheter was removed and a dressing was applied. The patient tolerated the procedure well without immediate post procedural complication. FINDINGS: A total of approximately 3.2 L of clear, amber fluid was removed. IMPRESSION: Successful ultrasound-guided paracentesis yielding 3.2 liters of peritoneal fluid. Performed by Clotilda Hesselbach, PA-C Electronically Signed   By: Ester Sides M.D.   On: 10/01/2023 12:46   NM PET Image Initial (PI) Skull Base To Thigh (F-18 FDG) Result Date: 09/25/2023 CLINICAL DATA:  Initial treatment strategy for omental and peritoneal nodularity compatible with carcinomatosis. EXAM: NUCLEAR MEDICINE PET SKULL BASE TO THIGH TECHNIQUE: 9.4 mCi F-18 FDG was injected intravenously. Full-ring PET imaging was performed from the skull base to thigh after the radiotracer. CT data was obtained and used for attenuation correction and anatomic localization. Fasting blood glucose: 116 mg/dl COMPARISON:  CT scan 1/74/7974 FINDINGS: Mediastinal blood pool activity: SUV max 2.4 Liver activity: SUV max NA NECK: No significant abnormal hypermetabolic activity in this region. Incidental CT findings: None.  CHEST: Small type 1 hiatal hernia, accompanied by a small amount of ascites, faint metabolic activity in the ascites with maximum SUV 2.3, cannot exclude malignant ascites extension up through the hiatus. Incidental CT findings: Mild scarring or subsegmental atelectasis anteriorly in the right upper lobe. ABDOMEN/PELVIS: Heavy burden of hypermetabolic omental caking with malignant ascites and numerous foci of hypermetabolic activity along the liver capsule, right paracolic gutter, left paracolic gutter, mesentery, and pelvic ascites. A representative region of the omental caking in the left lower quadrant anterior to the descending colon a maximum SUV of 14.6. A hypermetabolic tumor deposit in the pelvic ascites anterior to the rectum on image 171 series 4 has maximum SUV of 16.0 and measures 2.7 cm in long axis. Hypermetabolic focus along the posterosuperior liver margin maximum SUV 7.8. Isolation of primary site is problematic given the tumor deposits along the cecum and expected location of the appendix as well as the adnexa. Overall moderate amount of malignant ascites. Incidental CT findings: None. SKELETON: No significant abnormal hypermetabolic activity in this region. Incidental CT findings: None. IMPRESSION: 1. Heavy burden of hypermetabolic omental caking with malignant ascites and numerous foci of hypermetabolic activity along the liver capsule, right paracolic gutter, left paracolic gutter, mesentery, and pelvic ascites. Isolation of primary site is problematic given the tumor deposits along the cecum and expected location of the appendix as well as the adnexa. Overall moderate amount of malignant ascites. 2. Small type 1 hiatal hernia, accompanied by a small amount of ascites, faint metabolic activity in the ascites with maximum SUV 2.3, cannot exclude malignant ascites extension up through the hiatus. Electronically Signed   By: Ryan Salvage M.D.   On: 09/25/2023 16:59   US  Paracentesis Result  Date: 09/24/2023 INDICATION: other ascites Patient with history of bloating, abdominal distension, imaging findings of extensive omental and peritoneal nodularity, ascites; request received for diagnostic and therapeutic paracentesis. EXAM: ULTRASOUND GUIDED DIAGNOSTIC AND THERAPEUTIC PARACENTESIS MEDICATIONS: 8 mL 1% lidocaine  with epinephrine  COMPLICATIONS: None immediate. PROCEDURE: Informed written consent was obtained from the patient after a discussion of the risks, benefits and alternatives to treatment. A timeout was performed prior to the initiation of the procedure. Initial ultrasound scanning demonstrates a large amount of ascites within the LEFT lower abdominal quadrant. The left lower abdomen  was prepped and draped in the usual sterile fashion. 1% lidocaine  was used for local anesthesia. Following this, a 6 Fr Safe-T-Centesis catheter was introduced. An ultrasound image was saved for documentation purposes. The paracentesis was performed. The catheter was removed and a dressing was applied. The patient tolerated the procedure well without immediate post procedural complication. FINDINGS: A total of approximately 4.7 L of hazy, blood-tinged fluid was removed. Samples were sent to the laboratory as requested by the clinical team. IMPRESSION: Successful ultrasound-guided diagnostic and therapeutic paracentesis yielding 4.7 L of peritoneal fluid. Performed by: Franky Rakers, PA-C Electronically Signed   By: Thom Hall M.D.   On: 09/24/2023 17:10   CT ABDOMEN PELVIS W CONTRAST Result Date: 09/22/2023 CLINICAL DATA:  Bloating.  Distended abdomen. * Tracking Code: BO * EXAM: CT ABDOMEN AND PELVIS WITH CONTRAST TECHNIQUE: Multidetector CT imaging of the abdomen and pelvis was performed using the standard protocol following bolus administration of intravenous contrast. RADIATION DOSE REDUCTION: This exam was performed according to the departmental dose-optimization program which includes automated exposure  control, adjustment of the mA and/or kV according to patient size and/or use of iterative reconstruction technique. CONTRAST:  100mL ISOVUE -300 IOPAMIDOL  (ISOVUE -300) INJECTION 61% COMPARISON:  None Available. FINDINGS: Lower chest: Small focus of atelectasis in the inferior lingula. No pulmonary nodules. Hepatobiliary: No focal hepatic lesion. Low-density gallstone noted. No sickle size. Pancreas: Pancreas is normal. No ductal dilatation. No pancreatic inflammation. Spleen: Normal spleen Adrenals/urinary tract: Adrenal glands and kidneys are normal. The ureters and bladder normal. Stomach/Bowel: Stomach is normal. The small bowel is floating on moderate volume intraperitoneal free fluid within the small bowel mesentery. No bowel obstruction. Terminal ileum is normal. Appendix not clearly identified. The colon is normal. Contrast travels the entirety of the colon to the rectum. Vascular/Lymphatic: Abdominal aorta is normal caliber. No periportal or retroperitoneal adenopathy. No pelvic adenopathy. Reproductive: Uterus and ovaries are grossly normal. Other: Moderate volume intraperitoneal free fluid. There is extensive nodularity the greater omentum (omental caking, image 58/2 for example). There are peritoneal nodules noted. For example nodule adjacent the RIGHT hepatic lobe measuring 13 mm image 35/2. Peritoneal nodularity in the posterior cul-de-sac on image 84/2. Musculoskeletal: No aggressive osseous lesion. IMPRESSION: 1. 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 2. Moderate volume intraperitoneal free fluid. 3. No clear ovarian or appendiceal neoplasm. No clear primary malignancy identified. These results will be called to the ordering clinician or representative by the Radiologist Assistant, and communication documented in the PACS or [employer]. Electronically Signed   By: Jackquline Boxer M.D.   On: 09/22/2023 10:36

## 2023-10-04 LAB — CA 125: Cancer Antigen (CA) 125: 305 U/mL — ABNORMAL HIGH (ref 0.0–38.1)

## 2023-10-04 LAB — CANCER ANTIGEN 19-9: CA 19-9: 15 U/mL (ref 0–35)

## 2023-10-06 ENCOUNTER — Other Ambulatory Visit: Payer: Self-pay | Admitting: Hematology and Oncology

## 2023-10-06 ENCOUNTER — Ambulatory Visit: Admitting: Obstetrics and Gynecology

## 2023-10-06 ENCOUNTER — Inpatient Hospital Stay

## 2023-10-06 ENCOUNTER — Telehealth: Payer: Self-pay | Admitting: Oncology

## 2023-10-06 ENCOUNTER — Ambulatory Visit (HOSPITAL_COMMUNITY)
Admission: RE | Admit: 2023-10-06 | Discharge: 2023-10-06 | Disposition: A | Discharge status: Home / Self Care | Source: Ambulatory Visit | Attending: Gastroenterology | Admitting: Gastroenterology

## 2023-10-06 VITALS — BP 127/68 | HR 99 | Temp 97.7°F | Resp 16

## 2023-10-06 DIAGNOSIS — R188 Other ascites: Secondary | ICD-10-CM | POA: Diagnosis not present

## 2023-10-06 DIAGNOSIS — C801 Malignant (primary) neoplasm, unspecified: Secondary | ICD-10-CM | POA: Diagnosis not present

## 2023-10-06 DIAGNOSIS — R180 Malignant ascites: Secondary | ICD-10-CM | POA: Diagnosis not present

## 2023-10-06 DIAGNOSIS — C786 Secondary malignant neoplasm of retroperitoneum and peritoneum: Secondary | ICD-10-CM

## 2023-10-06 HISTORY — PX: IR PARACENTESIS: IMG2679

## 2023-10-06 MED ORDER — SODIUM CHLORIDE 0.9 % IV SOLN: Freq: Once | INTRAVENOUS | Status: AC

## 2023-10-06 MED ORDER — LIDOCAINE-EPINEPHRINE 1 %-1:100000 IJ SOLN
20.0000 mL | Freq: Once | INTRAMUSCULAR | Status: AC
  Administered 2023-10-06: 10 mL via INTRADERMAL

## 2023-10-06 NOTE — Telephone Encounter (Signed)
 Called Miranda Hill and her husband was also on the phone.  Asked if she would like to get IV fluids after her paracentesis today.  She is drinking lots of water and eating 100 grams of protein per day.  She does feel tired after having a paracentesis so she is hoping the fluids today will help with that.  Advised I will call back with the appointment time.   She also mentioned that she is having to get up every hour to urinate and have bowel movements (reports they look stringy) during the night.  Reports her stomach really hurts.  It is very tight and hurts to sit up.  Her husband asked if she can have a drain placed in her abdomen so that she can drain the fluid at home.  Also scheduled appointment with Dr. Lonn on 10/10/23 at 2:00 to review results.  She verbalized understanding and agreement.

## 2023-10-06 NOTE — Telephone Encounter (Signed)
 Please work with infusion to get her in for IVF I will discuss with them pro and cons of placement of drainage catheter

## 2023-10-07 ENCOUNTER — Encounter: Payer: Self-pay | Admitting: Hematology and Oncology

## 2023-10-07 ENCOUNTER — Other Ambulatory Visit: Payer: Self-pay | Admitting: Radiology

## 2023-10-07 ENCOUNTER — Telehealth: Payer: Self-pay | Admitting: Oncology

## 2023-10-07 ENCOUNTER — Encounter: Payer: Self-pay | Admitting: Oncology

## 2023-10-07 DIAGNOSIS — C800 Disseminated malignant neoplasm, unspecified: Secondary | ICD-10-CM

## 2023-10-07 NOTE — Telephone Encounter (Signed)
 Miranda Hill called asking about the drain instead of paracentesis.  Advised him that Dr. Dorie has been notified and that she will discuss the pros and cons with them at the appointment on Friday.  He also said they have [employer] through [employer] and have heard that [name] will not be available for 2026.  He is wondering what insurance would be in network with [facility] and would accept preexisting conditions.  Advised him I will try to find out and will let them know.  Advised him of the port placement apt on 10/15/23.  He asked if this could be moved up if possible.  Let me know I will check with IR and let them know if there are any cancellations.

## 2023-10-07 NOTE — Telephone Encounter (Signed)
 Received call from pt's husband asking for Darice.  Darice had stepped away so asked if I could help.  He reports that pt is not having a good day.  He reports that she is having abdominal pain.  Pt on phone also & states abdomen hurts intermittently with exertion & after eating.  She feels that fluid is recollecting.  She is trying to eat & drink fluids.  She denies fever. She vomited smidgenyest after eating.  She reports a hiatal hernia.  She reports that stomach is itching.  Call back is [phone].  Message to Dr Lonn.

## 2023-10-07 NOTE — H&P (Signed)
 Chief Complaint: Carcinomatosis peritonei, malignant ascites; status post paracentesis on 10/06/23; referred now for CT-guided omental biopsy/possible paracentesis for further evaluation  Referring Provider(s): [facility]  Supervising Physician: Vanice Revel  Patient Status: [facility] - Out-pt  History of Present Illness: Miranda Hill is a 58 y.o. 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  Cytology from recent paracentesis revealed malignant cells ,however there was insufficient cellularity on cellblock for further characterization.  She presents again today for CT-guided omental biopsy/possible paracentesis for further evaluation. CA 125 is 305.   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 Authorizing Provider  estradiol  (ESTRACE  VAGINAL) 0.1 MG/GM vaginal cream Apply 1/2 gram to vulva nightly for 2 weeks then decrease to 1/2 gram to vulva two nights a week. 09/01/23   Hines, Vera GAILS, MD  ondansetron  (ZOFRAN ) 8 MG tablet Take 1 tablet (8 mg total) by mouth every 8 (eight) hours as needed for nausea or vomiting. 10/03/23   Lonn Hicks, MD     Family History  Problem Relation Age of Onset   Hypertension Mother    Diabetes Mother    Colon polyps Mother    Colon cancer Mother    Hypertension Father    Cancer Father        lung and brain   Diabetes  Father    Other Sister        hypoglycemic   Colon polyps Sister    Depression Brother    Cancer Maternal Grandmother        UTERINE    Social History   Socioeconomic History   Marital status: Married    Spouse name: Not on file   Number of children: Not on file   Years of education: Not on file   Highest education level: Not on file  Occupational History   Not on file  Tobacco Use   Smoking status: Never   Smokeless tobacco: Never  Vaping Use   Vaping status: Never Used  Substance and Sexual Activity   Alcohol use: Not Currently   Drug use: No   Sexual activity: Yes    Birth control/protection: Post-menopausal    Comment: husband with vasectomy  Other Topics Concern   Not on file  Social History Narrative   Not on file   Social Drivers of Health   Financial Resource Strain: Not on file  Food Insecurity: No Food Insecurity (10/03/2023)   Hunger Vital Sign    Worried About Running Out of Food in the Last Year: Never true    Ran Out of Food in the Last Year: Never true  Transportation Needs: No Transportation Needs (10/03/2023)   PRAPARE - Administrator, Civil Service (Medical): No    Lack of Transportation (Non-Medical): No  Physical Activity: Not on  file  Stress: Not on file  Social Connections: Not on file       Review of Systems: denies fever,HA,CP,dyspnea, cough, N/V or bleeding; she does have abd discomfort, bloating  Vital Signs: Vitals:   10/08/23 0925  BP: (!) (P) 119/90  Pulse: (!) (P) 110  Resp: (P) 16  Temp: (P) 98.1 F (36.7 C)  SpO2: (P) 98%      Advance Care Plan: No documents on file  Physical Exam: awake/alert; chest- sl dim BS bases; heart- sl tachy but reg rhythm; abd-dist,+BS, currently NT; 1+ bilat pretibial edema  Imaging: IR Paracentesis Result Date: 10/06/2023 INDICATION: 58 year old female with 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 Request for therapeutic paracentesis.  EXAM: ULTRASOUND GUIDED THERAPEUTIC PARACENTESIS MEDICATIONS: 10 mL 1% lidocaine  COMPLICATIONS: None immediate. PROCEDURE: Informed written consent was obtained from the patient after a discussion of the risks, benefits and alternatives to treatment. A timeout was performed prior to the initiation of the procedure. Initial ultrasound scanning demonstrates a small amount of ascites within the right lower abdominal quadrant. The right lower abdomen was prepped and draped in the usual sterile fashion. 1% lidocaine  was used for local anesthesia. Following this, a 19 gauge, 7-cm, Centesis catheter was introduced. An ultrasound image was saved for documentation purposes. The paracentesis was performed. The catheter was removed and a dressing was applied. The patient tolerated the procedure well without immediate post procedural complication. Patient received post-procedure intravenous albumin; see nursing notes for details. FINDINGS: A total of approximately 3.4 liters of blood-tinged fluid was removed. Samples were sent to the laboratory as requested by the clinical team. IMPRESSION: Successful ultrasound-guided paracentesis yielding 3.4 liters of peritoneal fluid. Performed by: Kacie Matthews PA-C Electronically Signed   By: CHRISTELLA.  Shick M.D.   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 Request for therapeutic paracentesis. EXAM: ULTRASOUND GUIDED THERAPEUTIC PARACENTESIS MEDICATIONS: 6 mL 1% lidocaine  COMPLICATIONS: None immediate. PROCEDURE: Informed written consent was obtained from the patient after a discussion of the risks, benefits and alternatives to treatment. A timeout was performed prior to the initiation of the procedure. Initial ultrasound scanning demonstrates a large amount of ascites within the right lower abdominal quadrant. The right lower abdomen was prepped and draped  in the usual sterile fashion. 1% lidocaine  was used for local anesthesia. Following this, a 19 gauge, 7-cm, Yueh catheter was introduced. An ultrasound image was saved for documentation purposes. The paracentesis was performed. The catheter was removed and a dressing was applied. The patient tolerated the procedure well without immediate post procedural complication. FINDINGS: A total of approximately 3.2 L of clear, amber fluid was removed. IMPRESSION: Successful ultrasound-guided paracentesis yielding 3.2 liters of peritoneal fluid. Performed by Clotilda Hesselbach, PA-C Electronically Signed   By: Ester Sides M.D.   On: 10/01/2023 12:46   NM PET Image Initial (PI) Skull Base To Thigh (F-18 FDG) Result Date: 09/25/2023 CLINICAL DATA:  Initial treatment strategy for omental and peritoneal nodularity compatible with carcinomatosis. EXAM: NUCLEAR MEDICINE PET SKULL BASE TO THIGH TECHNIQUE: 9.4 mCi F-18 FDG was injected intravenously. Full-ring PET imaging was performed from the skull base to thigh after the radiotracer. CT data was obtained and used for attenuation correction and anatomic localization. Fasting blood glucose: 116 mg/dl COMPARISON:  CT scan 1/74/7974 FINDINGS: Mediastinal blood pool activity: SUV max 2.4 Liver activity: SUV max NA NECK: No significant  abnormal hypermetabolic activity in this region. Incidental CT findings: None. CHEST: Small type 1 hiatal hernia, accompanied by a small amount of ascites, faint metabolic activity in the ascites with maximum SUV 2.3, cannot exclude malignant ascites extension up through the hiatus. Incidental CT findings: Mild scarring or subsegmental atelectasis anteriorly in the right upper lobe. ABDOMEN/PELVIS: Heavy burden of hypermetabolic omental caking with malignant ascites and numerous foci of hypermetabolic activity along the liver capsule, right paracolic gutter, left paracolic gutter, mesentery, and pelvic ascites. A representative region of the omental  caking in the left lower quadrant anterior to the descending colon a maximum SUV of 14.6. A hypermetabolic tumor deposit in the pelvic ascites anterior to the rectum on image 171 series 4 has maximum SUV of 16.0 and measures 2.7 cm in long axis. Hypermetabolic focus along the posterosuperior liver margin maximum SUV 7.8. Isolation of primary site is problematic given the tumor deposits along the cecum and expected location of the appendix as well as the adnexa. Overall moderate amount of malignant ascites. Incidental CT findings: None. SKELETON: No significant abnormal hypermetabolic activity in this region. Incidental CT findings: None. IMPRESSION: 1. Heavy burden of hypermetabolic omental caking with malignant ascites and numerous foci of hypermetabolic activity along the liver capsule, right paracolic gutter, left paracolic gutter, mesentery, and pelvic ascites. Isolation of primary site is problematic given the tumor deposits along the cecum and expected location of the appendix as well as the adnexa. Overall moderate amount of malignant ascites. 2. Small type 1 hiatal hernia, accompanied by a small amount of ascites, faint metabolic activity in the ascites with maximum SUV 2.3, cannot exclude malignant ascites extension up through the hiatus. Electronically Signed   By: Ryan Salvage M.D.   On: 09/25/2023 16:59   US  Paracentesis Result Date: 09/24/2023 INDICATION: other ascites Patient with history of bloating, abdominal distension, imaging findings of extensive omental and peritoneal nodularity, ascites; request received for diagnostic and therapeutic paracentesis. EXAM: ULTRASOUND GUIDED DIAGNOSTIC AND THERAPEUTIC PARACENTESIS MEDICATIONS: 8 mL 1% lidocaine  with epinephrine  COMPLICATIONS: None immediate. PROCEDURE: Informed written consent was obtained from the patient after a discussion of the risks, benefits and alternatives to treatment. A timeout was performed prior to the initiation of the  procedure. Initial ultrasound scanning demonstrates a large amount of ascites within the LEFT lower abdominal quadrant. The left lower abdomen was prepped and draped in the usual sterile fashion. 1% lidocaine  was used for local anesthesia. Following this, a 6 Fr Safe-T-Centesis catheter was introduced. An ultrasound image was saved for documentation purposes. The paracentesis was performed. The catheter was removed and a dressing was applied. The patient tolerated the procedure well without immediate post procedural complication. FINDINGS: A total of approximately 4.7 L of hazy, blood-tinged fluid was removed. Samples were sent to the laboratory as requested by the clinical team. IMPRESSION: Successful ultrasound-guided diagnostic and therapeutic paracentesis yielding 4.7 L of peritoneal fluid. Performed by: Franky Rakers, PA-C Electronically Signed   By: Thom Hall M.D.   On: 09/24/2023 17:10   CT ABDOMEN PELVIS W CONTRAST Result Date: 09/22/2023 CLINICAL DATA:  Bloating.  Distended abdomen. * Tracking Code: BO * EXAM: CT ABDOMEN AND PELVIS WITH CONTRAST TECHNIQUE: Multidetector CT imaging of the abdomen and pelvis was performed using the standard protocol following bolus administration of intravenous contrast. RADIATION DOSE REDUCTION: This exam was performed according to the departmental dose-optimization program which includes automated exposure control, adjustment of the mA and/or kV according to patient size and/or use of iterative reconstruction  technique. CONTRAST:  100mL ISOVUE -300 IOPAMIDOL  (ISOVUE -300) INJECTION 61% COMPARISON:  None Available. FINDINGS: Lower chest: Small focus of atelectasis in the inferior lingula. No pulmonary nodules. Hepatobiliary: No focal hepatic lesion. Low-density gallstone noted. No sickle size. Pancreas: Pancreas is normal. No ductal dilatation. No pancreatic inflammation. Spleen: Normal spleen Adrenals/urinary tract: Adrenal glands and kidneys are normal. The ureters and  bladder normal. Stomach/Bowel: Stomach is normal. The small bowel is floating on moderate volume intraperitoneal free fluid within the small bowel mesentery. No bowel obstruction. Terminal ileum is normal. Appendix not clearly identified. The colon is normal. Contrast travels the entirety of the colon to the rectum. Vascular/Lymphatic: Abdominal aorta is normal caliber. No periportal or retroperitoneal adenopathy. No pelvic adenopathy. Reproductive: Uterus and ovaries are grossly normal. Other: Moderate volume intraperitoneal free fluid. There is extensive nodularity the greater omentum (omental caking, image 58/2 for example). There are peritoneal nodules noted. For example nodule adjacent the RIGHT hepatic lobe measuring 13 mm image 35/2. Peritoneal nodularity in the posterior cul-de-sac on image 84/2. Musculoskeletal: No aggressive osseous lesion. IMPRESSION: 1. 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 2. Moderate volume intraperitoneal free fluid. 3. No clear ovarian or appendiceal neoplasm. No clear primary malignancy identified. These results will be called to the ordering clinician or representative by the Radiologist Assistant, and communication documented in the PACS or [employer]. Electronically Signed   By: Jackquline Boxer M.D.   On: 09/22/2023 10:36    Labs:  CBC: Recent Labs    09/28/23 1618 10/03/23 1353  WBC 13.9* 20.1*  HGB 13.7 14.2  HCT 43.8 42.5  PLT 355 448*    COAGS: Recent Labs    09/28/23 1618  INR 1.1    BMP: Recent Labs    09/28/23 1618 10/03/23 1353  NA 135 129*  K 4.5 4.7  CL 100 97*  CO2 23 24  GLUCOSE 148* 212*  BUN 16 26*  CALCIUM 10.6* 11.3*  CREATININE 0.69 0.78  GFRNONAA >60 >60    LIVER FUNCTION TESTS: Recent Labs    09/28/23 1618 10/03/23 1353  BILITOT 0.5 0.5  AST 18 13*  ALT 10 8  ALKPHOS 86 83  PROT 6.3* 6.5  ALBUMIN 3.3* 3.2*    TUMOR MARKERS: Recent Labs    10/03/23 1353  CEA  <1.00    Assessment and Plan: 58 y.o. 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  Cytology from recent paracentesis revealed malignant cells ,however there was insufficient cellularity on cellblock for further characterization.  She presents again today for CT-guided omental biopsy/possible paracentesis for further evaluation. CA 125 is 305.Risks and benefits of procedure was discussed with the patient  including, but not limited to bleeding, infection, damage to adjacent structures or low yield requiring additional tests.  All of the questions were answered and there is agreement to proceed.  Consent signed and in chart.    Thank you for allowing our service to participate in Miranda Hill 's care.  Electronically Signed: D. Franky Rakers, PA-C   10/07/2023, 1:35 PM      I spent a total of  25 minutes   in face to face in clinical consultation, greater than 50% of which was counseling/coordinating care for image guided omental biopsy

## 2023-10-07 NOTE — Telephone Encounter (Signed)
 Called & spoke with pt's spouse & informed to cont with tylenol & try heating pad & Dr Lonn will try to see pt when she is over at [facility] for biopsy tomorrow.  He was satisfied with this & state pt just took tylenol a few minutes ago.

## 2023-10-07 NOTE — Telephone Encounter (Signed)
 Informed pt that she could try some tylenol to see if that helps.  Message was sent to Dr Lonn.

## 2023-10-08 ENCOUNTER — Ambulatory Visit (HOSPITAL_COMMUNITY)
Admission: RE | Admit: 2023-10-08 | Discharge: 2023-10-08 | Disposition: A | Discharge status: Home / Self Care | Source: Ambulatory Visit | Attending: Gastroenterology

## 2023-10-08 ENCOUNTER — Ambulatory Visit (HOSPITAL_COMMUNITY)
Admission: RE | Admit: 2023-10-08 | Discharge: 2023-10-08 | Disposition: A | Discharge status: Home / Self Care | Source: Ambulatory Visit | Attending: Gastroenterology | Admitting: Gastroenterology

## 2023-10-08 ENCOUNTER — Encounter (HOSPITAL_COMMUNITY): Payer: Self-pay

## 2023-10-08 ENCOUNTER — Ambulatory Visit (HOSPITAL_COMMUNITY)
Admission: RE | Admit: 2023-10-08 | Discharge: 2023-10-08 | Disposition: A | Discharge status: Home / Self Care | Source: Ambulatory Visit | Attending: Hematology and Oncology

## 2023-10-08 ENCOUNTER — Other Ambulatory Visit: Payer: Self-pay

## 2023-10-08 ENCOUNTER — Encounter: Payer: Self-pay | Admitting: Oncology

## 2023-10-08 ENCOUNTER — Other Ambulatory Visit (HOSPITAL_COMMUNITY): Payer: Self-pay

## 2023-10-08 ENCOUNTER — Other Ambulatory Visit (HOSPITAL_COMMUNITY): Payer: Self-pay | Admitting: Gastroenterology

## 2023-10-08 DIAGNOSIS — C800 Disseminated malignant neoplasm, unspecified: Secondary | ICD-10-CM

## 2023-10-08 DIAGNOSIS — G4730 Sleep apnea, unspecified: Secondary | ICD-10-CM | POA: Insufficient documentation

## 2023-10-08 DIAGNOSIS — C786 Secondary malignant neoplasm of retroperitoneum and peritoneum: Secondary | ICD-10-CM | POA: Diagnosis not present

## 2023-10-08 DIAGNOSIS — C801 Malignant (primary) neoplasm, unspecified: Secondary | ICD-10-CM | POA: Diagnosis not present

## 2023-10-08 DIAGNOSIS — R188 Other ascites: Secondary | ICD-10-CM

## 2023-10-08 DIAGNOSIS — R180 Malignant ascites: Secondary | ICD-10-CM | POA: Diagnosis not present

## 2023-10-08 DIAGNOSIS — R109 Unspecified abdominal pain: Secondary | ICD-10-CM

## 2023-10-08 DIAGNOSIS — C482 Malignant neoplasm of peritoneum, unspecified: Secondary | ICD-10-CM | POA: Diagnosis not present

## 2023-10-08 LAB — CBC WITH DIFFERENTIAL/PLATELET
Abs Immature Granulocytes: 0.25 K/uL — ABNORMAL HIGH (ref 0.00–0.07)
Basophils Absolute: 0 K/uL (ref 0.0–0.1)
Basophils Relative: 0 %
Eosinophils Absolute: 0 K/uL (ref 0.0–0.5)
Eosinophils Relative: 0 %
HCT: 42.6 % (ref 36.0–46.0)
Hemoglobin: 13.4 g/dL (ref 12.0–15.0)
Immature Granulocytes: 1 %
Lymphocytes Relative: 6 %
Lymphs Abs: 1.1 K/uL (ref 0.7–4.0)
MCH: 26.7 pg (ref 26.0–34.0)
MCHC: 31.5 g/dL (ref 30.0–36.0)
MCV: 85 fL (ref 80.0–100.0)
Monocytes Absolute: 1 K/uL (ref 0.1–1.0)
Monocytes Relative: 6 %
Neutro Abs: 15.9 K/uL — ABNORMAL HIGH (ref 1.7–7.7)
Neutrophils Relative %: 87 %
Platelets: 442 K/uL — ABNORMAL HIGH (ref 150–400)
RBC: 5.01 MIL/uL (ref 3.87–5.11)
RDW: 13.6 % (ref 11.5–15.5)
WBC: 18.3 K/uL — ABNORMAL HIGH (ref 4.0–10.5)
nRBC: 0 % (ref 0.0–0.2)

## 2023-10-08 LAB — BASIC METABOLIC PANEL WITH GFR
Anion gap: 12 (ref 5–15)
BUN: 17 mg/dL (ref 6–20)
CO2: 22 mmol/L (ref 22–32)
Calcium: 12.3 mg/dL — ABNORMAL HIGH (ref 8.9–10.3)
Chloride: 99 mmol/L (ref 98–111)
Creatinine, Ser: 0.64 mg/dL (ref 0.44–1.00)
GFR, Estimated: >60 mL/min (ref >60)
Glucose, Bld: 151 mg/dL — ABNORMAL HIGH (ref 70–99)
Potassium: 4.8 mmol/L (ref 3.5–5.1)
Sodium: 133 mmol/L — ABNORMAL LOW (ref 135–145)

## 2023-10-08 LAB — PROTIME-INR
INR: 1.2 (ref 0.8–1.2)
Prothrombin Time: 15.4 s — ABNORMAL HIGH (ref 11.4–15.2)

## 2023-10-08 MED ORDER — FENTANYL CITRATE (PF) 100 MCG/2ML IJ SOLN
INTRAMUSCULAR | Status: AC
  Filled 2023-10-08: qty 2

## 2023-10-08 MED ORDER — FENTANYL CITRATE (PF) 100 MCG/2ML IJ SOLN
INTRAMUSCULAR | Status: AC | PRN
  Administered 2023-10-08 (×2): 50 ug via INTRAVENOUS

## 2023-10-08 MED ORDER — SODIUM CHLORIDE 0.9 % IV SOLN: INTRAVENOUS | Status: DC

## 2023-10-08 MED ORDER — LIDOCAINE-EPINEPHRINE (PF) 2 %-1:200000 IJ SOLN
INTRAMUSCULAR | Status: AC
  Filled 2023-10-08: qty 20

## 2023-10-08 MED ORDER — MIDAZOLAM HCL 2 MG/2ML IJ SOLN
INTRAMUSCULAR | Status: AC
  Filled 2023-10-08: qty 4

## 2023-10-08 MED ORDER — MIDAZOLAM HCL 2 MG/2ML IJ SOLN
INTRAMUSCULAR | Status: AC | PRN
  Administered 2023-10-08 (×2): 1 mg via INTRAVENOUS

## 2023-10-08 NOTE — Discharge Instructions (Signed)
   Discharge Instructions:   Please call Interventional Radiology clinic [phone] with any questions or concerns.  You may remove your dressing and shower tomorrow.    Needle Biopsy, Care After The following information offers guidance on how to care for yourself after your procedure. Your health care provider may also give you more specific instructions. If you have problems or questions, contact your health care provider. What can I expect after the procedure? After the procedure, it is common to have: Soreness, pain, and tenderness where a tissue sample was taken (biopsy site). Bruising or mild pain at the biopsy site. These symptoms should go away after a few days. Follow these instructions at home: Biopsy site care  Follow instructions from your health care provider about how to take care of your biopsy site. Make sure you: Wash your hands with soap and water for at least 20 seconds before and after you change your bandage (dressing). If soap and water are not available, use hand sanitizer. Know when and how to change your dressing. Know when to remove your dressing. Check your puncture site every day for signs of infection. Check for: More redness, swelling, or pain. More drainage of fluid or blood. More warmth. Pus or a bad smell. General instructions Rest as told by your health care provider. Do not take baths, swim, or use a hot tub until your health care provider approves. Ask your health care provider if you may take showers. You may only be allowed to take sponge baths. Take over-the-counter and prescription medicines only as told by your health care provider. Return to your normal activities as told by your health care provider. Ask your health care provider what activities are safe for you. If you have airplane travel scheduled, talk with your health care provider about when it is safe for you to travel by airplane. This is specific to certain biopsy  procedures. It is up to you to get the results of your procedure. Ask your health care provider, or the department that is doing the procedure, when your results will be ready. Keep all follow-up visits. You may need to make an appointment to get your biopsy results. Contact a health care provider if: You have a fever. You have more redness, swelling, or pain at the puncture site that lasts longer than a few days. You have more fluid or blood coming from your puncture site. You have pus or a bad smell coming from your puncture site. Your puncture site feels warm to the touch. You have pain that does not get better with medicine. Get help right away if: You have severe bleeding from the puncture site. You have chest pain. You have problems breathing. You cough up blood. You faint. You have a very fast heart rate. These symptoms may be an emergency. Get help right away. Call 911. Do not wait to see if the symptoms will go away. Do not drive yourself to the hospital. Summary After the procedure, it is common to have soreness, bruising, tenderness, or mild pain at the biopsy site. These symptoms should go away in a few days. Check your biopsy site every day for signs of infection, such as more redness, swelling, or pain. Do not take baths, swim, or use a hot tub until your health care provider approves. Ask your health care provider if you may take showers. Contact a heath care provider if you have more redness, swelling, or pain at the puncture site that lasts longer than   a few days. This information is not intended to replace advice given to you by your health care provider. Make sure you discuss any questions you have with your health care provider. Document Revised: 01/10/2021 Document Reviewed: 01/10/2021 Elsevier Patient Education  2023 Elsevier Inc.   Moderate Conscious Sedation, Adult, Care After This sheet gives you information about how to care for yourself after your procedure.  Your health care provider may also give you more specific instructions. If you have problems or questions, contact your health care provider. What can I expect after the procedure? After the procedure, it is common to have: Sleepiness for several hours. Impaired judgment for several hours. Difficulty with balance. Vomiting if you eat too soon. Follow these instructions at home: For the time period you were told by your health care provider:  Rest. Do not participate in activities where you could fall or become injured. Do not drive or use machinery. Do not drink alcohol. Do not take sleeping pills or medicines that cause drowsiness. Do not make important decisions or sign legal documents. Do not take care of children on your own. Eating and drinking  Follow the diet recommended by your health care provider. Drink enough fluid to keep your urine pale yellow. If you vomit: Drink water, juice, or soup when you can drink without vomiting. Make sure you have little or no nausea before eating solid foods. General instructions Take over-the-counter and prescription medicines only as told by your health care provider. Have a responsible adult stay with you for the time you are told. It is important to have someone help care for you until you are awake and alert. Do not smoke. Keep all follow-up visits as told by your health care provider. This is important. Contact a health care provider if: You are still sleepy or having trouble with balance after 24 hours. You feel light-headed. You keep feeling nauseous or you keep vomiting. You develop a rash. You have a fever. You have redness or swelling around the IV site. Get help right away if: You have trouble breathing. You have new-onset confusion at home. Summary After the procedure, it is common to feel sleepy, have impaired judgment, or feel nauseous if you eat too soon. Rest after you get home. Know the things you should not do after  the procedure. Follow the diet recommended by your health care provider and drink enough fluid to keep your urine pale yellow. Get help right away if you have trouble breathing or new-onset confusion at home. This information is not intended to replace advice given to you by your health care provider. Make sure you discuss any questions you have with your health care provider. Document Revised: 05/14/2019 Document Reviewed: 12/10/2018 Elsevier Patient Education  2023 Elsevier Inc.    

## 2023-10-08 NOTE — Procedures (Signed)
 Ultrasound-guided diagnostic and therapeutic paracentesis performed yielding 3.1 liters of amber colored fluid. No immediate complications. A portion of the fluid was sent to the lab for cytology. EBL none.

## 2023-10-08 NOTE — Procedures (Signed)
 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 TREVOR Stepfon Rawles, MD

## 2023-10-08 NOTE — Progress Notes (Signed)
 Paracentesis order placed per Dr. Lonn.

## 2023-10-08 NOTE — Progress Notes (Signed)
 Requested a rush on CT biopsy from today with [facility].

## 2023-10-10 ENCOUNTER — Other Ambulatory Visit: Payer: Self-pay | Admitting: Hematology and Oncology

## 2023-10-10 ENCOUNTER — Encounter: Payer: Self-pay | Admitting: *Deleted

## 2023-10-10 ENCOUNTER — Inpatient Hospital Stay: Admitting: Hematology and Oncology

## 2023-10-10 ENCOUNTER — Encounter: Payer: Self-pay | Admitting: Hematology and Oncology

## 2023-10-10 ENCOUNTER — Other Ambulatory Visit (HOSPITAL_COMMUNITY): Payer: Self-pay

## 2023-10-10 ENCOUNTER — Other Ambulatory Visit: Payer: Self-pay

## 2023-10-10 VITALS — BP 121/80 | HR 109 | Temp 97.5°F | Resp 18 | Ht 62.0 in | Wt 177.6 lb

## 2023-10-10 DIAGNOSIS — R180 Malignant ascites: Secondary | ICD-10-CM

## 2023-10-10 DIAGNOSIS — C786 Secondary malignant neoplasm of retroperitoneum and peritoneum: Secondary | ICD-10-CM

## 2023-10-10 DIAGNOSIS — Z609 Problem related to social environment, unspecified: Secondary | ICD-10-CM | POA: Diagnosis not present

## 2023-10-10 DIAGNOSIS — C569 Malignant neoplasm of unspecified ovary: Secondary | ICD-10-CM

## 2023-10-10 DIAGNOSIS — E860 Dehydration: Secondary | ICD-10-CM

## 2023-10-10 LAB — CYTOLOGY - NON PAP

## 2023-10-10 MED ORDER — DEXAMETHASONE 4 MG PO TABS
ORAL_TABLET | ORAL | 1 refills | Status: DC
  Filled 2023-10-10: qty 30, 15d supply, fill #0

## 2023-10-10 MED ORDER — LIDOCAINE-PRILOCAINE 2.5-2.5 % EX CREA
TOPICAL_CREAM | CUTANEOUS | 3 refills | Status: DC
  Filled 2023-10-10: qty 30, 30d supply, fill #0

## 2023-10-10 MED ORDER — PROCHLORPERAZINE MALEATE 10 MG PO TABS
10.0000 mg | ORAL_TABLET | Freq: Four times a day (QID) | ORAL | 1 refills | Status: DC | PRN
  Filled 2023-10-10: qty 30, 8d supply, fill #0

## 2023-10-10 NOTE — Assessment & Plan Note (Addendum)
 She has recurrent reaccumulation of ascites However, the amount of fluid drained each visit is not much I suspect majority of her symptoms are due to untreated carcinomatosis I explained to the patient and her husband why placement of permanent drain is not indicated now Despite her discomfort, I do not feel strongly that she needs paracentesis today Will call her next week to check on her

## 2023-10-10 NOTE — Assessment & Plan Note (Addendum)
 She has limited income and has part-time job only without disability benefit She is wondering about whether she should apply for permanent disability I recommend referral to social worker to identify resources

## 2023-10-10 NOTE — Research (Signed)
 [employer] 2021-05 - Specimen Collection Study to Evaluate Biomarkers in Subjects with Cancer     Patient Miranda Hill was identified by Dr. Lonn as a potential candidate for the above listed study.  This Clinical Research Nurse met with [name], [id number], on 10/10/23 in a manner and location that ensures patient privacy to discuss participation in the above listed research study.  Patient is Accompanied by husband.  A copy of the informed consent document with embedded HIPAA language was provided to the patient.  Patient reads, speaks, and understands [place].   Patient was provided with the business card of this Nurse and encouraged to contact the research team with any questions.  Approximately 15 minutes were spent with the patient reviewing the informed consent documents.  Patient was provided the option of taking informed consent documents home to review and was encouraged to review at their convenience with their support network, including other care providers. Patient took the consent documents home to review. Plan to call pt on Monday, 10/13/23 to see if she has any questions about the study.    Levon FREDRIK Sandifer RN, BSN, CCRP Clinical Research Nurse Lead 10/10/2023 3:09 PM

## 2023-10-10 NOTE — Assessment & Plan Note (Addendum)
 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 The intent is of curative intent.  We discussed some of the risks, benefits, side-effects of carboplatin & Taxol. Treatment is intravenous, every 3 weeks x 6 cycles  Some of the short term side-effects included, though not limited to, including weight loss, life threatening infections, risk of allergic reactions, need for transfusions of blood products, nausea, vomiting, change in bowel habits, loss of hair, admission to hospital for various reasons, and risks of death.   Long term side-effects are also discussed including risks of infertility, permanent damage to nerve function, hearing loss, chronic fatigue, kidney damage with possibility needing hemodialysis, and rare secondary malignancy including bone marrow disorders.  The patient is aware that the response rates discussed earlier is not guaranteed.  After a long discussion, patient made an informed decision to proceed with the prescribed plan of care.   Patient education material was dispensed. We discussed premedication with dexamethasone  before chemotherapy.

## 2023-10-10 NOTE — Progress Notes (Signed)
 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 The intent is of curative intent.  We discussed some of the risks, benefits, side-effects of carboplatin & Taxol. Treatment is intravenous, every 3 weeks x 6 cycles  Some of the short term side-effects included, though not limited to, including weight loss, life threatening infections, risk of allergic reactions, need for transfusions of blood products, nausea, vomiting, change in bowel habits, loss of hair, admission to hospital for various reasons, and risks of death.   Long term side-effects are also discussed including risks of infertility, permanent damage to nerve function, hearing loss, chronic fatigue, kidney damage with possibility needing hemodialysis, and rare secondary malignancy including bone marrow disorders.  The patient is aware that the response rates discussed earlier is not guaranteed.  After a long discussion, patient made an informed decision to proceed with the prescribed plan of care.   Patient education material was dispensed. We discussed premedication with dexamethasone  before  chemotherapy.  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 However, the amount of fluid drained each visit is not much I suspect majority of her symptoms are due to untreated carcinomatosis I explained to the patient and her husband why placement of permanent drain is not indicated now Despite her discomfort, I do not feel strongly that she needs paracentesis today Will call her next week to check on her Dehydration Her recent labs suggest hyponatremia and elevated calcium likely due to dehydration She is able to drink more fluids She does not need IV fluids today Poor social situation She has limited income and has part-time job only without disability benefit She is wondering about whether she should apply for permanent disability I recommend referral to Child psychotherapist to identify resources  Orders Placed This Encounter  Procedures   CBC with Differential (Cancer Center Only)    Standing Status:   Future    Expected Date:   10/20/2023    Expiration Date:   10/19/2024   CMP (Cancer Center only)    Standing Status:   Future    Expected Date:   10/20/2023    Expiration Date:   10/19/2024   CBC with Differential (Cancer Center Only)    Standing Status:   Future    Expected Date:   11/10/2023    Expiration Date:   11/09/2024   CMP (Cancer Center only)    Standing Status:   Future    Expected Date:   11/10/2023    Expiration Date:   11/09/2024   CBC with Differential (Cancer Center Only)    Standing Status:   Future    Expected Date:   12/01/2023    Expiration Date:   11/30/2024   CMP (Cancer Center only)  Standing Status:   Future    Expected Date:   12/01/2023    Expiration Date:   11/30/2024   CBC with Differential (Cancer Center Only)    Standing Status:   Future    Expected Date:   12/22/2023    Expiration Date:   12/21/2024   CMP (Cancer Center only)    Standing Status:   Future    Expected Date:   12/22/2023     Expiration Date:   12/21/2024   CBC with Differential (Cancer Center Only)    Standing Status:   Future    Expected Date:   01/12/2024    Expiration Date:   01/11/2025   CMP (Cancer Center only)    Standing Status:   Future    Expected Date:   01/12/2024    Expiration Date:   01/11/2025   CBC with Differential (Cancer Center Only)    Standing Status:   Future    Expected Date:   02/02/2024    Expiration Date:   02/01/2025   CMP (Cancer Center only)    Standing Status:   Future    Expected Date:   02/02/2024    Expiration Date:   02/01/2025   CMP (Cancer Center only)    Standing Status:   Future    Expiration Date:   10/09/2024   CBC with Differential (Cancer Center Only)    Standing Status:   Future    Expiration Date:   10/09/2024   Ambulatory referral to Social Work    Referral Priority:   Routine    Referral Type:   Consultation    Referral Reason:   Specialty Services Required    Number of Visits Requested:   1     Almarie Bedford, MD  INTERVAL HISTORY: she returns for surveillance follow-up and to review test results She is here accompanied by her husband She had recent paracentesis with some fluid removed She continues to feel discomfort No vomiting She is able to increase her oral protein intake I reviewed her flowsheet from foot documentation I reviewed test results from recent paracentesis and discussed treatment plan  PHYSICAL EXAMINATION: ECOG PERFORMANCE STATUS: 2 - Symptomatic, <50% confined to bed  Vitals:   10/10/23 1412  BP: 121/80  Pulse: (!) 109  Resp: 18  Temp: (!) 97.5 F (36.4 C)  SpO2: 98%   Filed Weights   10/10/23 1412  Weight: 177 lb 9.6 oz (80.6 kg)    Relevant data reviewed during this visit included cytology from paracentesis, tumor markers

## 2023-10-10 NOTE — Assessment & Plan Note (Addendum)
 Her recent labs suggest hyponatremia and elevated calcium likely due to dehydration She is able to drink more fluids She does not need IV fluids today

## 2023-10-10 NOTE — Research (Signed)
 Effectiveness of Out-of-Pocket Psychologist, forensic ([employer]) in Cancer Patients   Patient Miranda Hill was identified by Dr. Lonn as a potential candidate for the above listed study.  This Clinical Research Nurse met with Miranda Hill, [id number], on 10/10/23 in a manner and location that ensures patient privacy to discuss participation in the above listed research study.  Patient is Accompanied by her husband.  A copy of the informed consent document and separate HIPAA Authorization was provided to the patient.  Patient reads, speaks, and understands [place].   Patient was provided with the business card of this Nurse and encouraged to contact the research team with any questions.  Approximately 15 minutes was spent with the patient reviewing the informed consent documents.  Patient was provided the option of taking informed consent documents home to review and was encouraged to review at their convenience with their support network, including other care providers. Patient took the consent documents home to review.  Plan to call the pt on Monday, 10/13/23 to discuss the study and answer any questions.   Levon FREDRIK Sandifer RN, BSN, CCRP Clinical Research Nurse Lead 10/10/2023 3:16 PM

## 2023-10-12 ENCOUNTER — Other Ambulatory Visit (HOSPITAL_COMMUNITY): Payer: Self-pay

## 2023-10-12 ENCOUNTER — Other Ambulatory Visit: Payer: Self-pay

## 2023-10-13 ENCOUNTER — Inpatient Hospital Stay: Admitting: Licensed Clinical Social Worker

## 2023-10-13 ENCOUNTER — Other Ambulatory Visit: Payer: Self-pay | Admitting: Radiology

## 2023-10-13 ENCOUNTER — Other Ambulatory Visit: Payer: Self-pay | Admitting: Hematology and Oncology

## 2023-10-13 ENCOUNTER — Telehealth: Payer: Self-pay

## 2023-10-13 ENCOUNTER — Ambulatory Visit (HOSPITAL_COMMUNITY)
Admission: RE | Admit: 2023-10-13 | Discharge: 2023-10-13 | Disposition: A | Discharge status: Home / Self Care | Source: Ambulatory Visit | Attending: Hematology and Oncology | Admitting: Hematology and Oncology

## 2023-10-13 DIAGNOSIS — C569 Malignant neoplasm of unspecified ovary: Secondary | ICD-10-CM | POA: Insufficient documentation

## 2023-10-13 DIAGNOSIS — R180 Malignant ascites: Secondary | ICD-10-CM | POA: Insufficient documentation

## 2023-10-13 MED ORDER — LIDOCAINE-EPINEPHRINE (PF) 2 %-1:200000 IJ SOLN
INTRAMUSCULAR | Status: AC
  Filled 2023-10-13: qty 20

## 2023-10-13 NOTE — Telephone Encounter (Signed)
 S/w patient and husband at length regarding management of ongoing nausea, constipation, and heart burn. Reviewed how to take patient's nausea medications and provided additional recommendations for OTC medications to help combat heart burn and constipation. Patient and husband verbalized an understanding of the information. Patient feels that she requires additional paracentesis at this time. Dr. Lonn has placed a stat order for paracentesis to be done today. Patient scheduled to have procedure done today at [facility] at 2:30 PM. Confirmed appointment date and time with husband upon return call. All questions answered during call.

## 2023-10-13 NOTE — Procedures (Signed)
 Ultrasound-guided  therapeutic paracentesis performed yielding 4.4 liters of amber fluid. No immediate complications. EBL none.

## 2023-10-13 NOTE — Progress Notes (Signed)
 Pharmacist Chemotherapy Monitoring - Initial Assessment    Anticipated start date: 10/20/23   The following has been reviewed per standard work regarding the patient's treatment regimen: The patient's diagnosis, treatment plan and drug doses, and organ/hematologic function Lab orders and baseline tests specific to treatment regimen  The treatment plan start date, drug sequencing, and pre-medications Prior authorization status  Patient's documented medication list, including drug-drug interaction screen and prescriptions for anti-emetics and supportive care specific to the treatment regimen The drug concentrations, fluid compatibility, administration routes, and timing of the medications to be used The patient's access for treatment and lifetime cumulative dose history, if applicable  The patient's medication allergies and previous infusion related reactions, if applicable   Changes made to treatment plan:  N/A  Follow up needed:  Pending authorization for treatment Port placement scheduled for 10/15/23  Miranda Hill, RPH, 10/13/2023  4:01 PM

## 2023-10-13 NOTE — Progress Notes (Signed)
 [facility] Clinical Social Work  Initial Assessment   Miranda Hill is a 58 y.o. year old female contacted by phone. Clinical Social Work was referred by medical provider for financial concerns.   SDOH (Social Determinants of Health) assessments performed: Yes SDOH Interventions    Flowsheet Row Clinical Support from 10/13/2023 in [facility] Of [facility]. [facility] Office Visit from 10/03/2023 in [facility]. [facility]  SDOH Interventions    Housing Interventions Intervention Not Indicated Patient Declined    SDOH Screenings   Food Insecurity: No Food Insecurity (10/03/2023)  Housing: Low Risk  (10/13/2023)  Recent Concern: Housing - High Risk (10/03/2023)  Transportation Needs: No Transportation Needs (10/03/2023)  Utilities: Not At Risk (10/03/2023)  Depression (PHQ2-9): Low Risk  (10/06/2023)  Tobacco Use: Low Risk  (10/10/2023)    PHQ 2/9:    10/06/2023   11:00 AM 10/03/2023   12:56 PM 09/01/2023    2:26 PM  Depression screen PHQ 2/9  Decreased Interest 0 0 0  Down, Depressed, Hopeless 0 0 0  PHQ - 2 Score 0 0 0     Distress Screen completed: No     No data to display            Family/Social Information:  Housing Arrangement: patient lives with her husband, Elna.  Pt & husband are helping care for a homeless veteran who is staying in their attached apartment Family members/support persons in your life? Family (husband, son, twin sister, mom), Friends, and Estate agent concerns: no  Employment: was working part-time as a Manufacturing systems engineer through [employer] and caring for kids in the afternoons. Currently unable to work due to cancer. Husband is self-employed in the remodeling/construction field.  Income source: previously employment. Now, solely husband's self-employment Financial concerns: Yes, due to illness and/or loss of work during treatment Type of concern: Utilities and Medical bills Food  access concerns: no Religious or spiritual practice: Yes-has a very strong faith that she credits to helping her through past difficulties (single mom) and current physical pain Advanced directives: Not known Services Currently in place:  [employer] (Ecologist)  Coping/ Adjustment to diagnosis: Patient understands treatment plan and what happens next? yes, she is having another paracentesis today and preparing to start chemo next week. Pt currently has her son, grandson, twin sister, and mom visiting for support. Concerns about diagnosis and/or treatment: being out of work; pain Patient reported stressors: Work/ school and [employer] and/or priorities: hopes chemo is effective in stopping fluid accumulation since it is causing so much pain and discomfort. Hopes to be able to engage more with her grandson as she used to Patient enjoys time with family/ friends Current coping skills/ strengths: Ability for insight , Manufacturing systems engineer , Motivation for treatment/growth , Religious Affiliation , and Supportive family/friends     SUMMARY: Current SDOH Barriers:  Financial constraints related to loss of work due to cancer and upcoming treatment  Clinical Social Work Clinical Goal(s):  Explore community resource options for unmet needs related to:  Financial Strain   Interventions: Discussed common feeling and emotions when being diagnosed with cancer, and the importance of support during treatment Informed patient of the support team roles and support services at [facility] Provided CSW contact information and encouraged patient to call with any questions or concerns Referred patient to community resources: [employer] (getting ROI signed tomorrow)  Briefly discussed cancer foundations- will provide more information during 1st chemo tx   Follow Up Plan: CSW will see patient on 9/16 for pt to sign ROI for [facility] and on 9/22 during 1st infusion Patient verbalizes  understanding of plan: Yes    Charliene Inoue E Linzi Ohlinger, LCSW Clinical Social Worker [facility]

## 2023-10-14 ENCOUNTER — Other Ambulatory Visit: Payer: Self-pay

## 2023-10-14 ENCOUNTER — Encounter: Payer: Self-pay | Admitting: Oncology

## 2023-10-14 ENCOUNTER — Other Ambulatory Visit: Payer: Self-pay | Admitting: Hematology and Oncology

## 2023-10-14 ENCOUNTER — Encounter (HOSPITAL_COMMUNITY): Payer: Self-pay

## 2023-10-14 ENCOUNTER — Inpatient Hospital Stay (HOSPITAL_COMMUNITY)
Admission: EM | Admit: 2023-10-14 | Discharge: 2023-10-18 | DRG: 749 | Disposition: A | Discharge status: Home / Self Care | Attending: Internal Medicine | Admitting: Internal Medicine

## 2023-10-14 ENCOUNTER — Telehealth: Payer: Self-pay | Admitting: Hematology and Oncology

## 2023-10-14 ENCOUNTER — Ambulatory Visit (HOSPITAL_COMMUNITY)

## 2023-10-14 ENCOUNTER — Inpatient Hospital Stay

## 2023-10-14 ENCOUNTER — Telehealth: Payer: Self-pay | Admitting: Surgery

## 2023-10-14 ENCOUNTER — Inpatient Hospital Stay: Admitting: Licensed Clinical Social Worker

## 2023-10-14 DIAGNOSIS — C569 Malignant neoplasm of unspecified ovary: Secondary | ICD-10-CM | POA: Diagnosis not present

## 2023-10-14 DIAGNOSIS — Z818 Family history of other mental and behavioral disorders: Secondary | ICD-10-CM | POA: Diagnosis not present

## 2023-10-14 DIAGNOSIS — Z91040 Latex allergy status: Secondary | ICD-10-CM | POA: Diagnosis not present

## 2023-10-14 DIAGNOSIS — D72829 Elevated white blood cell count, unspecified: Secondary | ICD-10-CM | POA: Diagnosis not present

## 2023-10-14 DIAGNOSIS — Z808 Family history of malignant neoplasm of other organs or systems: Secondary | ICD-10-CM

## 2023-10-14 DIAGNOSIS — R748 Abnormal levels of other serum enzymes: Secondary | ICD-10-CM | POA: Diagnosis not present

## 2023-10-14 DIAGNOSIS — E860 Dehydration: Secondary | ICD-10-CM | POA: Diagnosis not present

## 2023-10-14 DIAGNOSIS — C786 Secondary malignant neoplasm of retroperitoneum and peritoneum: Secondary | ICD-10-CM | POA: Diagnosis not present

## 2023-10-14 DIAGNOSIS — R7303 Prediabetes: Secondary | ICD-10-CM | POA: Diagnosis not present

## 2023-10-14 DIAGNOSIS — Z800 Family history of malignant neoplasm of digestive organs: Secondary | ICD-10-CM | POA: Diagnosis not present

## 2023-10-14 DIAGNOSIS — Z6832 Body mass index (BMI) 32.0-32.9, adult: Secondary | ICD-10-CM | POA: Diagnosis not present

## 2023-10-14 DIAGNOSIS — K5909 Other constipation: Secondary | ICD-10-CM | POA: Diagnosis present

## 2023-10-14 DIAGNOSIS — E669 Obesity, unspecified: Secondary | ICD-10-CM | POA: Diagnosis present

## 2023-10-14 DIAGNOSIS — R180 Malignant ascites: Secondary | ICD-10-CM | POA: Diagnosis not present

## 2023-10-14 DIAGNOSIS — Z91018 Allergy to other foods: Secondary | ICD-10-CM

## 2023-10-14 DIAGNOSIS — Z885 Allergy status to narcotic agent status: Secondary | ICD-10-CM | POA: Diagnosis not present

## 2023-10-14 DIAGNOSIS — E871 Hypo-osmolality and hyponatremia: Secondary | ICD-10-CM | POA: Diagnosis not present

## 2023-10-14 DIAGNOSIS — Z801 Family history of malignant neoplasm of trachea, bronchus and lung: Secondary | ICD-10-CM | POA: Diagnosis not present

## 2023-10-14 DIAGNOSIS — Z833 Family history of diabetes mellitus: Secondary | ICD-10-CM

## 2023-10-14 DIAGNOSIS — C801 Malignant (primary) neoplasm, unspecified: Secondary | ICD-10-CM | POA: Diagnosis present

## 2023-10-14 DIAGNOSIS — R7401 Elevation of levels of liver transaminase levels: Secondary | ICD-10-CM | POA: Diagnosis not present

## 2023-10-14 DIAGNOSIS — Z8249 Family history of ischemic heart disease and other diseases of the circulatory system: Secondary | ICD-10-CM | POA: Diagnosis not present

## 2023-10-14 DIAGNOSIS — Z8543 Personal history of malignant neoplasm of ovary: Secondary | ICD-10-CM | POA: Diagnosis not present

## 2023-10-14 DIAGNOSIS — E875 Hyperkalemia: Secondary | ICD-10-CM | POA: Diagnosis not present

## 2023-10-14 DIAGNOSIS — E790 Hyperuricemia without signs of inflammatory arthritis and tophaceous disease: Secondary | ICD-10-CM | POA: Diagnosis not present

## 2023-10-14 DIAGNOSIS — K5900 Constipation, unspecified: Secondary | ICD-10-CM | POA: Diagnosis not present

## 2023-10-14 DIAGNOSIS — R1084 Generalized abdominal pain: Secondary | ICD-10-CM | POA: Diagnosis not present

## 2023-10-14 DIAGNOSIS — R971 Elevated cancer antigen 125 [CA 125]: Secondary | ICD-10-CM | POA: Diagnosis not present

## 2023-10-14 LAB — URINALYSIS, COMPLETE (UACMP) WITH MICROSCOPIC
Bilirubin Urine: NEGATIVE
Glucose, UA: NEGATIVE mg/dL
Hgb urine dipstick: NEGATIVE
Ketones, ur: NEGATIVE mg/dL
Leukocytes,Ua: NEGATIVE
Nitrite: NEGATIVE
Protein, ur: NEGATIVE mg/dL
Specific Gravity, Urine: 1.017 (ref 1.005–1.030)
pH: 5 (ref 5.0–8.0)

## 2023-10-14 LAB — CBC WITH DIFFERENTIAL (CANCER CENTER ONLY)
Abs Immature Granulocytes: 0.17 K/uL — ABNORMAL HIGH (ref 0.00–0.07)
Basophils Absolute: 0 K/uL (ref 0.0–0.1)
Basophils Relative: 0 %
Eosinophils Absolute: 0 K/uL (ref 0.0–0.5)
Eosinophils Relative: 0 %
HCT: 40 % (ref 36.0–46.0)
Hemoglobin: 13.3 g/dL (ref 12.0–15.0)
Immature Granulocytes: 1 %
Lymphocytes Relative: 4 %
Lymphs Abs: 0.8 K/uL (ref 0.7–4.0)
MCH: 27.5 pg (ref 26.0–34.0)
MCHC: 33.3 g/dL (ref 30.0–36.0)
MCV: 82.8 fL (ref 80.0–100.0)
Monocytes Absolute: 1.1 K/uL — ABNORMAL HIGH (ref 0.1–1.0)
Monocytes Relative: 6 %
Neutro Abs: 16.3 K/uL — ABNORMAL HIGH (ref 1.7–7.7)
Neutrophils Relative %: 89 %
Platelet Count: 189 K/uL (ref 150–400)
RBC: 4.83 MIL/uL (ref 3.87–5.11)
RDW: 14.1 % (ref 11.5–15.5)
WBC Count: 18.4 K/uL — ABNORMAL HIGH (ref 4.0–10.5)
nRBC: 0 % (ref 0.0–0.2)

## 2023-10-14 LAB — CBC
HCT: 41.6 % (ref 36.0–46.0)
Hemoglobin: 13.1 g/dL (ref 12.0–15.0)
MCH: 26.7 pg (ref 26.0–34.0)
MCHC: 31.5 g/dL (ref 30.0–36.0)
MCV: 84.7 fL (ref 80.0–100.0)
Platelets: 389 K/uL (ref 150–400)
RBC: 4.91 MIL/uL (ref 3.87–5.11)
RDW: 14 % (ref 11.5–15.5)
WBC: 18.7 K/uL — ABNORMAL HIGH (ref 4.0–10.5)
nRBC: 0 % (ref 0.0–0.2)

## 2023-10-14 LAB — CMP (CANCER CENTER ONLY)
ALT: 19 U/L (ref 0–44)
AST: 32 U/L (ref 15–41)
Albumin: 2.7 g/dL — ABNORMAL LOW (ref 3.5–5.0)
Alkaline Phosphatase: 135 U/L — ABNORMAL HIGH (ref 38–126)
Anion gap: 3 — ABNORMAL LOW (ref 5–15)
BUN: 23 mg/dL — ABNORMAL HIGH (ref 6–20)
CO2: 30 mmol/L (ref 22–32)
Calcium: 14 mg/dL (ref 8.9–10.3)
Chloride: 94 mmol/L — ABNORMAL LOW (ref 98–111)
Creatinine: 0.83 mg/dL (ref 0.44–1.00)
GFR, Estimated: >60 mL/min (ref >60)
Glucose, Bld: 160 mg/dL — ABNORMAL HIGH (ref 70–99)
Potassium: 5.2 mmol/L — ABNORMAL HIGH (ref 3.5–5.1)
Sodium: 127 mmol/L — ABNORMAL LOW (ref 135–145)
Total Bilirubin: 1.1 mg/dL (ref 0.0–1.2)
Total Protein: 5.4 g/dL — ABNORMAL LOW (ref 6.5–8.1)

## 2023-10-14 LAB — BASIC METABOLIC PANEL WITH GFR
Anion gap: 9 (ref 5–15)
Anion gap: 10 (ref 5–15)
BUN: 21 mg/dL — ABNORMAL HIGH (ref 6–20)
BUN: 24 mg/dL — ABNORMAL HIGH (ref 6–20)
CO2: 26 mmol/L (ref 22–32)
CO2: 24 mmol/L (ref 22–32)
Calcium: 14.4 mg/dL (ref 8.9–10.3)
Calcium: 12.8 mg/dL — ABNORMAL HIGH (ref 8.9–10.3)
Chloride: 91 mmol/L — ABNORMAL LOW (ref 98–111)
Chloride: 96 mmol/L — ABNORMAL LOW (ref 98–111)
Creatinine, Ser: 0.83 mg/dL (ref 0.44–1.00)
Creatinine, Ser: 0.84 mg/dL (ref 0.44–1.00)
GFR, Estimated: >60 mL/min (ref >60)
GFR, Estimated: >60 mL/min (ref >60)
Glucose, Bld: 146 mg/dL — ABNORMAL HIGH (ref 70–99)
Glucose, Bld: 195 mg/dL — ABNORMAL HIGH (ref 70–99)
Potassium: 5.4 mmol/L — ABNORMAL HIGH (ref 3.5–5.1)
Potassium: 4.9 mmol/L (ref 3.5–5.1)
Sodium: 126 mmol/L — ABNORMAL LOW (ref 135–145)
Sodium: 130 mmol/L — ABNORMAL LOW (ref 135–145)

## 2023-10-14 LAB — MAGNESIUM: Magnesium: 2.5 mg/dL — ABNORMAL HIGH (ref 1.7–2.4)

## 2023-10-14 MED ORDER — TRAZODONE HCL 50 MG PO TABS: 25.0000 mg | ORAL_TABLET | Freq: Every evening | ORAL | Status: DC | PRN

## 2023-10-14 MED ORDER — OXYCODONE-ACETAMINOPHEN 5-325 MG PO TABS
1.0000 | ORAL_TABLET | ORAL | Status: DC | PRN
  Filled 2023-10-14: qty 1

## 2023-10-14 MED ORDER — SENNOSIDES-DOCUSATE SODIUM 8.6-50 MG PO TABS
2.0000 | ORAL_TABLET | Freq: Two times a day (BID) | ORAL | Status: DC
  Administered 2023-10-14 – 2023-10-18 (×7): 2 via ORAL
  Filled 2023-10-14 (×9): qty 2

## 2023-10-14 MED ORDER — ENOXAPARIN SODIUM 40 MG/0.4ML IJ SOSY
40.0000 mg | PREFILLED_SYRINGE | Freq: Every day | INTRAMUSCULAR | Status: DC
  Administered 2023-10-14 – 2023-10-17 (×4): 40 mg via SUBCUTANEOUS
  Filled 2023-10-14 (×5): qty 0.4

## 2023-10-14 MED ORDER — DEXAMETHASONE 4 MG PO TABS
12.0000 mg | ORAL_TABLET | Freq: Every day | ORAL | Status: DC
  Administered 2023-10-14 – 2023-10-16 (×3): 12 mg via ORAL
  Filled 2023-10-14 (×3): qty 3

## 2023-10-14 MED ORDER — POLYETHYLENE GLYCOL 3350 17 G PO PACK: 17.0000 g | PACK | Freq: Every day | ORAL | Status: DC | PRN

## 2023-10-14 MED ORDER — ENSURE PLUS HIGH PROTEIN PO LIQD
237.0000 mL | Freq: Two times a day (BID) | ORAL | Status: DC
  Administered 2023-10-15 – 2023-10-18 (×6): 237 mL via ORAL

## 2023-10-14 MED ORDER — ONDANSETRON HCL 4 MG PO TABS: 4.0000 mg | ORAL_TABLET | Freq: Four times a day (QID) | ORAL | Status: DC | PRN

## 2023-10-14 MED ORDER — PANTOPRAZOLE SODIUM 40 MG PO TBEC
40.0000 mg | DELAYED_RELEASE_TABLET | Freq: Every day | ORAL | Status: DC
  Administered 2023-10-15 – 2023-10-18 (×4): 40 mg via ORAL
  Filled 2023-10-14 (×4): qty 1

## 2023-10-14 MED ORDER — SODIUM CHLORIDE 0.9 % IV SOLN: INTRAVENOUS | Status: AC

## 2023-10-14 MED ORDER — SODIUM CHLORIDE 0.9 % IV SOLN: Freq: Once | INTRAVENOUS | Status: AC

## 2023-10-14 MED ORDER — ALBUTEROL SULFATE (2.5 MG/3ML) 0.083% IN NEBU: 2.5000 mg | INHALATION_SOLUTION | RESPIRATORY_TRACT | Status: DC | PRN

## 2023-10-14 MED ORDER — SODIUM CHLORIDE 0.9 % IV BOLUS
1000.0000 mL | Freq: Once | INTRAVENOUS | Status: AC
  Administered 2023-10-14: 1000 mL via INTRAVENOUS

## 2023-10-14 MED ORDER — ACETAMINOPHEN 325 MG PO TABS
650.0000 mg | ORAL_TABLET | Freq: Four times a day (QID) | ORAL | Status: DC | PRN
  Administered 2023-10-15 (×2): 650 mg via ORAL
  Filled 2023-10-14 (×2): qty 2

## 2023-10-14 MED ORDER — CALCITONIN (SALMON) 200 UNIT/ACT NA SOLN
1.0000 | Freq: Every day | NASAL | Status: DC
  Administered 2023-10-14 – 2023-10-18 (×5): 1 via NASAL
  Filled 2023-10-14: qty 3.7

## 2023-10-14 MED ORDER — ACETAMINOPHEN 650 MG RE SUPP: 650.0000 mg | Freq: Four times a day (QID) | RECTAL | Status: DC | PRN

## 2023-10-14 MED ORDER — FUROSEMIDE 10 MG/ML IJ SOLN
20.0000 mg | Freq: Once | INTRAMUSCULAR | Status: AC
  Administered 2023-10-14: 20 mg via INTRAVENOUS
  Filled 2023-10-14: qty 4

## 2023-10-14 MED ORDER — ONDANSETRON HCL 4 MG/2ML IJ SOLN
4.0000 mg | Freq: Four times a day (QID) | INTRAMUSCULAR | Status: DC | PRN
  Administered 2023-10-15: 4 mg via INTRAVENOUS
  Filled 2023-10-14: qty 2

## 2023-10-14 NOTE — Progress Notes (Signed)
 Requested MMR and Her2 on accession [id number] with [facility] per Dr. Lonn.

## 2023-10-14 NOTE — ED Provider Notes (Signed)
 [place] EMERGENCY DEPARTMENT AT [facility] Provider Note   CSN: [id number] Arrival date & time: 10/14/23  1046     Patient presents with: abnormal labs   Miranda Hill is a 58 y.o. female.   HPI  Patient is a 58 year old female with a history of ovarian cancer primary with peritoneal carcinomatosis follows with our oncology group was sent to emergency department by oncology for evaluation of hypercalcemia.  She denies any specific muscle spasms to me.  She has some generalized achy abdominal pain that seems to be unchanged today.  She has had recurrence of ascites from her carcinomatosis/cancer.  She endorses fatigue but no specific other symptoms.     Prior to Admission medications   Medication Sig Start Date End Date Taking? Authorizing Provider  dexamethasone  (DECADRON ) 4 MG tablet Take 2 tablets (8mg ) by mouth daily starting the day after carboplatin  for 3 days. Take with food 10/10/23  Yes Gorsuch, Ni, MD  omeprazole (PRILOSEC OTC) 20 MG tablet Take 20 mg by mouth daily before breakfast.   Yes [provider]  ondansetron  (ZOFRAN ) 8 MG tablet Take 1 tablet (8 mg total) by mouth every 8 (eight) hours as needed for nausea or vomiting. 10/03/23  Yes Lonn Hicks, MD  prochlorperazine  (COMPAZINE ) 10 MG tablet Take 1 tablet (10 mg total) by mouth every 6 (six) hours as needed for nausea or vomiting. 10/10/23  Yes Lonn Hicks, MD  lidocaine -prilocaine  (EMLA ) cream Apply to affected area once 10/10/23   Lonn Hicks, MD    Allergies: Tree extract, Codeine, and Latex    Review of Systems  Updated Vital Signs BP 111/72   Pulse 84   Temp (!) 97.4 F (36.3 C) (Oral)   Resp 17   SpO2 95%   Physical Exam Vitals and nursing note reviewed.  Constitutional:      General: She is not in acute distress. HENT:     Head: Normocephalic and atraumatic.     Nose: Nose normal.  Eyes:     General: No scleral icterus. Cardiovascular:     Rate and Rhythm: Normal rate  and regular rhythm.     Pulses: Normal pulses.     Heart sounds: Normal heart sounds.  Pulmonary:     Effort: Pulmonary effort is normal. No respiratory distress.     Breath sounds: No wheezing.  Abdominal:     General: There is distension.     Palpations: Abdomen is soft.  Musculoskeletal:     Cervical back: Normal range of motion.     Right lower leg: No edema.     Left lower leg: No edema.  Skin:    General: Skin is warm and dry.     Capillary Refill: Capillary refill takes less than 2 seconds.  Neurological:     Mental Status: She is alert. Mental status is at baseline.  Psychiatric:        Mood and Affect: Mood normal.        Behavior: Behavior normal.     (all labs ordered are listed, but only abnormal results are displayed) Labs Reviewed  MAGNESIUM - Abnormal; Notable for the following components:      Result Value   Magnesium 2.5 (*)    All other components within normal limits  BASIC METABOLIC PANEL WITH GFR - Abnormal; Notable for the following components:   Sodium 126 (*)    Potassium 5.4 (*)    Chloride 91 (*)    Glucose, Bld 146 (*)  BUN 21 (*)    Calcium 14.4 (*)    All other components within normal limits  CBC - Abnormal; Notable for the following components:   WBC 18.7 (*)    All other components within normal limits  URINALYSIS, COMPLETE (UACMP) WITH MICROSCOPIC - Abnormal; Notable for the following components:   Bacteria, UA RARE (*)    All other components within normal limits  CALCIUM, IONIZED    EKG: None  Radiology: US  Paracentesis Result Date: 10/13/2023 INDICATION: recurrent ascites Patient with 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 Request received for therapeutic paracentesis. EXAM: ULTRASOUND GUIDED THERAPEUTIC PARACENTESIS MEDICATIONS: 8 mL 1% lidocaine  with epinephrine  to skin and subcutaneous tissue COMPLICATIONS: None immediate. PROCEDURE: Informed written consent was obtained from the patient after  a discussion of the risks, benefits and alternatives to treatment. A timeout was performed prior to the initiation of the procedure. Initial ultrasound scanning demonstrates a moderate amount of ascites within the left lower abdominal quadrant. The left lower abdomen was prepped and draped in the usual sterile fashion. 1% lidocaine  was used for local anesthesia. Following this, a 6 Fr Safe-T-Centesis catheter was introduced. An ultrasound image was saved for documentation purposes. The paracentesis was performed. The catheter was removed and a dressing was applied. The patient tolerated the procedure well without immediate post procedural complication. FINDINGS: A total of approximately 4.4 L of amber fluid was removed. IMPRESSION: Successful ultrasound-guided therapeutic paracentesis yielding 4.4 L liters of peritoneal fluid. Performed by: Franky Rakers, PA-C under direct supervision of Thom Hall, MD Electronically Signed   By: Thom Hall M.D.   On: 10/13/2023 16:47     .Critical Care  Performed by: Miranda Hamp RAMAN, PA Authorized by: Miranda Hamp RAMAN, PA   Critical care provider statement:    Critical care time (minutes):  35   Critical care time was exclusive of:  Separately billable procedures and treating other patients and teaching time   Critical care was necessary to treat or prevent imminent or life-threatening deterioration of the following conditions:  Metabolic crisis   Critical care was time spent personally by me on the following activities:  Development of treatment plan with patient or surrogate, review of old charts, re-evaluation of patient's condition, pulse oximetry, ordering and review of radiographic studies, ordering and review of laboratory studies, ordering and performing treatments and interventions, obtaining history from patient or surrogate, examination of patient and evaluation of patient's response to treatment   Care discussed with: admitting provider       Medications Ordered in the ED  oxyCODONE -acetaminophen  (PERCOCET/ROXICET) 5-325 MG per tablet 1 tablet (has no administration in time range)  calcitonin (salmon) (MIACALCIN /FORTICAL) nasal spray 1 spray (has no administration in time range)  dexamethasone  (DECADRON ) tablet 12 mg (12 mg Oral Given 10/14/23 1244)  senna-docusate (Senokot-S) tablet 2 tablet (has no administration in time range)  sodium chloride  0.9 % bolus 1,000 mL (1,000 mLs Intravenous New Bag/Given 10/14/23 1221)  0.9 %  sodium chloride  infusion ( Intravenous New Bag/Given 10/14/23 1349)                                    Medical Decision Making Amount and/or Complexity of Data Reviewed Labs: ordered.  Risk Prescription drug management. Decision regarding hospitalization.   This patient presents to the ED for concern of hypercalcemia, this involves a number of treatment options, and is a complaint that carries  with it a high risk of complications and morbidity. A differential diagnosis was considered for the patient's symptoms which is discussed below:   This is most likely a result of her cancer although her specific treatment with oncology may be at play understanding is that she has not started chemotherapy yet however.   Co morbidities: Discussed in HPI   Brief History:  Patient is a 58 year old female with a history of ovarian cancer primary with peritoneal carcinomatosis follows with our oncology group was sent to emergency department by oncology for evaluation of hypercalcemia.  She denies any specific muscle spasms to me.  She has some generalized achy abdominal pain that seems to be unchanged today.  She has had recurrence of ascites from her carcinomatosis/cancer.  She endorses fatigue but no specific other symptoms.    EMR reviewed including pt PMHx, past surgical history and past visits to ER.   See HPI for more details   Lab Tests:   Calcium of 14.4 I did obtain magnesium, which is  elevated, CBC with leukocytosis that is nonspecific in this particular symptom presentation and an ionized calcium which is pending.   Imaging Studies:  No imaging studies ordered for this patient    Cardiac Monitoring:  The patient was maintained on a cardiac monitor.  I personally viewed and interpreted the cardiac monitored which showed an underlying rhythm of: NSR EKG non-ischemic QT approximately normal   Medicines ordered:  I ordered medication including Lasix  and normal saline to dilute patient's hypercalcemia and hyperkalemia Reevaluation of the patient after these medicines showed that the patient is the same I have reviewed the patients home medicines and have made adjustments as needed   Critical Interventions:   treatment of hypercalcemia hyperkalemia   Consults/Attending Physician   I discussed this case with my attending physician who cosigned this note including patient's presenting symptoms, physical exam, and planned diagnostics and interventions. Attending physician stated agreement with plan or made changes to plan which were implemented.  I discussed this case with oncology who will evaluate patient and make recommendations.  Discussed with hospitalist who will admit  Reevaluation:  After the interventions noted above I re-evaluated patient and found that they have :stayed the same   Social Determinants of Health:      Problem List / ED Course:  Hypercalcemia and hyperkalemia and hyponatremia in the setting of peritoneal carcinomatosis.  Patient unfortunately has metastatic disease or at least is assumed to have this from a primary ovarian cancer standpoint.  Will require admission.   Dispostion:  After consideration of the diagnostic results and the patients response to treatment, I feel that the patent would benefit from admission        Final diagnoses:  Malignant neoplasm of ovary, unspecified laterality [facility])  Hypercalcemia  Other  constipation    ED Discharge Orders     None          Miranda Hill, [place] 10/14/23 1628    Miranda Hamilton, MD 10/15/23 [phone]

## 2023-10-14 NOTE — Telephone Encounter (Signed)
 I was notified of her critical labs this morning.  The patient was seen for advanced ovarian cancer, due to start chemotherapy next week.  Unfortunately, her labs came back showing severe hypercalcemia due to malignancy.  Due to the critical nature of malignant hypercalcemia, I directed her for admission to the hospital for acute management of hypercalcemia with plan to start chemotherapy while hospitalized.  The risk of not treating her as soon as possible with the risk of death Plan of care is discussed with the patient and her husband and she agreed to be admitted

## 2023-10-14 NOTE — H&P (Signed)
 History and Physical  Miranda Hill FMW:[id number] DOB: 13-Jul-1965 DOA: 10/14/2023  PCP: [name], FNP   Chief Complaint: Elevated calcium  HPI: Miranda Hill is a 58 y.o. female with medical history significant for recently diagnosis of peritoneal carcinomatosis due to suspected ovarian cancer, prediabetes, sleep apnea being admitted to the hospital with hypercalcemia of malignancy.  She has been followed by Dr. Lonn of oncology, who plans port placement and initiation of chemotherapy as an outpatient this coming week.  Unfortunately the patient has continued to develop recurrent malignant ascites, and routine outpatient lab work showed severe malignant hypercalcemia.  History is provided by the patient, her husband, sister and mother who are at the bedside and state that the patient has been a little bit more lethargic the last couple of days with nausea.  They deny any confusion, headaches, somnolence or other specific concerns.  She has also been constipated.  Patient was sent to the ER by her oncologist, who has seen her here in the hospital this afternoon.  Patient has been started on IV fluids, received a dose of IV Lasix , and calcitonin.  They plan for inpatient chemotherapy.  Review of Systems: Please see HPI for pertinent positives and negatives. A complete 10 system review of systems are otherwise negative.  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   History of chlamydia    1990   Multiple thyroid  nodules    Prediabetes    Sleep apnea    Past Surgical History:  Procedure Laterality Date   INTRAUTERINE DEVICE INSERTION  07/18/2008   MIRENA   IR PARACENTESIS  10/01/2023   IR PARACENTESIS  10/06/2023   SALIVARY GLAND SURGERY     ABSCESS   URETHRAL SLING  1999   WREN   Social History:  reports that she has never smoked. She has never used smokeless tobacco. She reports that she does not currently use alcohol. She reports that  she does not use drugs.  Allergies  Allergen Reactions   Tree Extract Anaphylaxis, Hives, Swelling and Other (See Comments)    TREE NUTS, Nutmeg, pistachios, macadamias (patient somewhat disagrees with this in 09/2023)   Codeine Nausea And Vomiting   Latex Other (See Comments)    Latex Band-Aids = Caused skin irritation    Family History  Problem Relation Age of Onset   Hypertension Mother    Diabetes Mother    Colon polyps Mother    Colon cancer Mother    Hypertension Father    Cancer Father        lung and brain   Diabetes Father    Other Sister        hypoglycemic   Colon polyps Sister    Depression Brother    Cancer Maternal Grandmother        UTERINE     Prior to Admission medications   Medication Sig Start Date End Date Taking? Authorizing Provider  dexamethasone  (DECADRON ) 4 MG tablet Take 2 tablets (8mg ) by mouth daily starting the day after carboplatin  for 3 days. Take with food 10/10/23  Yes Gorsuch, Almarie, MD  lidocaine -prilocaine  (EMLA ) cream Apply to affected area once 10/10/23  Yes Gorsuch, Ni, MD  omeprazole (PRILOSEC OTC) 20 MG tablet Take 20 mg by mouth daily before breakfast.   Yes [provider]  ondansetron  (ZOFRAN ) 8 MG tablet Take 1 tablet (8 mg total) by mouth every 8 (eight) hours as needed for nausea or vomiting. 10/03/23  Yes Lonn Hicks, MD  prochlorperazine  (COMPAZINE ) 10 MG tablet Take 1 tablet (10 mg total) by mouth every 6 (six) hours as needed for nausea or vomiting. 10/10/23  Yes Lonn Hicks, MD    Physical Exam: BP 120/75   Pulse 90   Temp 97.9 F (36.6 C) (Oral)   Resp (!) 21   SpO2 94%  General:  Alert, oriented, calm, in no acute distress, tired appearing.  Her husband, sister and mother at the bedside. Cardiovascular: RRR, no murmurs or rubs, she has 2+ pitting peripheral edema  Respiratory: clear to auscultation bilaterally, no wheezes, no crackles, breathing comfortably lying flat on a stretcher in the ER Abdomen: soft,  nontender, distended, normal bowel tones heard  Skin: dry, no rashes  Musculoskeletal: no joint effusions, normal range of motion  Psychiatric: appropriate affect, normal speech  Neurologic: extraocular muscles intact, clear speech, moving all extremities with intact sensorium         Labs on Admission:  Basic Metabolic Panel: Recent Labs  Lab 10/08/23 0951 10/14/23 0809 10/14/23 1216  NA 133* 127* 126*  K 4.8 5.2* 5.4*  CL 99 94* 91*  CO2 22 30 26   GLUCOSE 151* 160* 146*  BUN 17 23* 21*  CREATININE 0.64 0.83 0.83  CALCIUM 12.3* 14.0* 14.4*  MG  --   --  2.5*   Liver Function Tests: Recent Labs  Lab 10/14/23 0809  AST 32  ALT 19  ALKPHOS 135*  BILITOT 1.1  PROT 5.4*  ALBUMIN 2.7*   No results for input(s): LIPASE, AMYLASE in the last 168 hours. No results for input(s): AMMONIA in the last 168 hours. CBC: Recent Labs  Lab 10/08/23 0951 10/14/23 0809 10/14/23 1216  WBC 18.3* 18.4* 18.7*  NEUTROABS 15.9* 16.3*  --   HGB 13.4 13.3 13.1  HCT 42.6 40.0 41.6  MCV 85.0 82.8 84.7  PLT 442* 189 389   Cardiac Enzymes: No results for input(s): CKTOTAL, CKMB, CKMBINDEX, TROPONINI in the last 168 hours. BNP (last 3 results) No results for input(s): BNP in the last 8760 hours.  ProBNP (last 3 results) No results for input(s): PROBNP in the last 8760 hours.  CBG: No results for input(s): GLUCAP in the last 168 hours.  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 Request received for therapeutic paracentesis. EXAM: ULTRASOUND GUIDED THERAPEUTIC PARACENTESIS MEDICATIONS: 8 mL 1% lidocaine  with epinephrine  to skin and subcutaneous tissue COMPLICATIONS: None immediate. PROCEDURE: Informed written consent was obtained from the patient after a discussion of the risks, benefits and alternatives to treatment. A timeout  was performed prior to the initiation of the procedure. Initial ultrasound scanning demonstrates a moderate amount of ascites within the left lower abdominal quadrant. The left lower abdomen was prepped and draped in the usual sterile fashion. 1% lidocaine  was used for local anesthesia. Following this, a 6 Fr Safe-T-Centesis catheter was introduced. An ultrasound image was saved for documentation purposes. The paracentesis was performed. The catheter was removed and a dressing was applied. The patient tolerated the procedure well without immediate post procedural complication. FINDINGS: A total of approximately 4.4 L of amber fluid was removed. IMPRESSION: Successful ultrasound-guided therapeutic paracentesis yielding 4.4 L liters of peritoneal fluid. Performed by: Franky Rakers, PA-C under direct supervision of Thom Hall, MD Electronically Signed   By: Thom Hall M.D.   On: 10/13/2023 16:47   Assessment/Plan Miranda Hill is a 58  y.o. female with medical history significant for recently diagnosis of peritoneal carcinomatosis due to suspected ovarian cancer, prediabetes, sleep apnea being admitted to the hospital with hypercalcemia of malignancy.  Hypercalcemia of malignancy-due to suspected ovarian neoplasm and peritoneal carcinomatosis -Inpatient admission -Aggressive IV fluids -Received a small dose of IV Lasix  and calcitonin in the emergency department -Monitor BMP and calcium levels with daily labs  Malignant ovarian neoplasm-suspected due to high-grade carcinoma on pathology, and elevated CA125 -Anticipate inpatient chemotherapy -Has been seen by IR, who plans port placement tomorrow  Malignant ascites-probably will require repeat therapeutic paracentesis during this hospitalization.  She just had 4.4 L paracentesis 9/15.  Constipation-possibly due to her hypercalcemia -Senokot twice daily -MiraLAX  as needed  DVT prophylaxis: Lovenox      Code Status: Full Code  Consults called:  Oncology Dr. Lonn  Admission status: The appropriate patient status for this patient is INPATIENT. Inpatient status is judged to be reasonable and necessary in order to provide the required intensity of service to ensure the patient's safety. The patient's presenting symptoms, physical exam findings, and initial radiographic and laboratory data in the context of their chronic comorbidities is felt to place them at high risk for further clinical deterioration. Furthermore, it is not anticipated that the patient will be medically stable for discharge from the hospital within 2 midnights of admission.    I certify that at the point of admission it is my clinical judgment that the patient will require inpatient hospital care spanning beyond 2 midnights from the point of admission due to high intensity of service, high risk for further deterioration and high frequency of surveillance required  Time spent: 59 minutes  Miranda Hill CHRISTELLA Gail MD [facility] Pager [phone]  If 7PM-7AM, please contact night-coverage [email or web address] Password [facility]  10/14/2023, 3:23 PM

## 2023-10-14 NOTE — ED Provider Triage Note (Signed)
 Emergency Medicine Provider Triage Evaluation Note  Miranda Hill , a 58 y.o. female  was evaluated in triage.  Pt complains of abd pain and cramps.   Is a metastatic ovarian cancer. Came to onc visit today for labs and found to have multiple electrolyte issues   Review of Systems  Positive:  Negative:   Physical Exam  BP 115/68 (BP Location: Left Arm)   Pulse 88   Temp (!) 97.4 F (36.3 C) (Oral)   Resp 18   SpO2 97%  Gen:   Awake, no distress   Resp:  Normal effort MSK:   Moves extremities without difficulty  Other:    Medical Decision Making  Medically screening exam initiated at 11:26 AM.  Appropriate orders placed.  Miranda Hill was informed that the remainder of the evaluation will be completed by another provider, this initial triage assessment does not replace that evaluation, and the importance of remaining in the ED until their evaluation is complete.  [address]    Neldon Inoue [place], [place] 10/14/23 1128

## 2023-10-14 NOTE — Progress Notes (Signed)
 [facility] Clinical Social Work  CSW met with pt during chemo education. Pt signed ROI for [facility]. CSW submitted referral and ROI to [facility] for assistance with disability application.  CSW will meet with pt next week in infusion for ongoing support and to discuss additional cancer foundation resources.   Rashawn Rayman E Lorretta Kerce, LCSW

## 2023-10-14 NOTE — ED Provider Notes (Signed)
 I provided a substantive portion of the care of this patient.  I personally made/approved the management plan for this patient and take responsibility for the patient management.       CRITICAL CARE Performed by: Leonel Mccollum Total critical care time: 40 minutes Critical care time was exclusive of separately billable procedures and treating other patients. Critical care was necessary to treat or prevent imminent or life-threatening deterioration. Critical care was time spent personally by me on the following activities: development of treatment plan with patient and/or surrogate as well as nursing, discussions with consultants, evaluation of patient's response to treatment, examination of patient, obtaining history from patient or surrogate, ordering and performing treatments and interventions, ordering and review of laboratory studies, ordering and review of radiographic studies, pulse oximetry and re-evaluation of patient's condition.  Patient seen by me along with physician assistant.  Labs have been repeated.  Patient with known metastatic ovarian cancer.  Patient had labs done this morning calcium was markedly high.  She was sent to the emergency department based on this.  Patient nontoxic no acute distress.  EKG cardiac monitoring without any significant EKG findings.  The elevated calcium is probably secondary to the tumor.  Hematology oncology contacted.  They have ordered some calcitonin.  We will give a liter of normal saline and then run 200 cc/h.  She will require admission.  Patient without any significant symptoms but calcium is greater than 14.  In addition they have ordered steroids for her.  Decadron .  Has stated no evidence of short QT.  No arrhythmias no heart block.    Quintasha Gren, MD 10/14/23 1235

## 2023-10-14 NOTE — Progress Notes (Signed)
 Miranda Hill   DOB:11-28-1965   FM#:[id number]    ASSESSMENT & PLAN:  Malignant neoplasm of ovary, unspecified laterality [facility]) The patient has developed malignant ascites and abdominal carcinomatosis, source is unknown, suspect GYN primary Pathology from recent biopsy confirmed high-grade serous carcinoma, together with elevated CA125, raises suspicion that she might have ovarian cancer She has completed chemo education class Due to severe malignant hypercalcemia, she will receive inpatient chemotherapy while hospitalized  Malignant hypercalcemia We discussed the danger of untreated hypercalcemia I recommend aggressive IV fluid resuscitation I will start her on daily dexamethasone  and nasal calcitonin Once we are able to get her adequately hydrated, we will add furosemide  and Zometa  next We will continue to trend her calcium level daily   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 She had paracentesis yesterday  With rapid fluid accumulation, she will likely need another paracentesis end of the week  dehydration, hyponatremia Her recent labs suggest hyponatremia and elevated calcium likely due to dehydration She will continue IV fluids as above  Constipation Could be due to hypercalcemia Will start aggressive laxatives  Goals of care discussion She will need inpatient chemotherapy Risks of not treating her with the death I have attempted to try to get her treated as soon as I can but unfortunately with her severe hypercalcemia, she needs to be treated in the hospital  Discharge planning Likely end of the week or early next week  Almarie Bedford, MD 10/14/2023 12:47 PM  Subjective:  Patient is known to me.  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  She is supposed to get port placement tomorrow and chemotherapy next week Labs today show severe hypercalcemia and  she is directed to be admitted for management of malignant hypercalcemia She has poor oral intake She denies nausea vomiting She has been constipated for 2 days She appears alert and oriented x 3 We discussed management of hypercalcemia and the role of inpatient chemotherapy  Objective:  Vitals:   10/14/23 1103 10/14/23 1137  BP: 115/68   Pulse: 88   Resp: 18   Temp: (!) 97.4 F (36.3 C)   SpO2: 97% 95%    No intake or output data in the 24 hours ending 10/14/23 1247  Oncology History  Ovarian cancer (HCC)  09/01/2023 Pathology Results   Pap smear is negative for malignancy   09/22/2023 Imaging   IR Paracentesis Result Date: 10/01/2023 INDICATION: Patient with recently diagnosed metastatic cancer of unknown primary with recurrent malignant ascites. Request for therapeutic paracentesis. EXAM: ULTRASOUND GUIDED THERAPEUTIC PARACENTESIS MEDICATIONS: 6 mL 1% lidocaine  COMPLICATIONS: None immediate. PROCEDURE: Informed written consent was obtained from the patient after a discussion of the risks, benefits and alternatives to treatment. A timeout was performed prior to the initiation of the procedure. Initial ultrasound scanning demonstrates a large amount of ascites within the right lower abdominal quadrant. The right lower abdomen was prepped and draped in the usual sterile fashion. 1% lidocaine  was used for local anesthesia. Following this, a 19 gauge, 7-cm, Yueh catheter was introduced. An ultrasound image was saved for documentation purposes. The paracentesis was performed. The catheter was removed and a dressing was applied. The patient tolerated the procedure well without immediate post procedural complication. FINDINGS: A total of approximately 3.2 L of clear, amber fluid was removed. IMPRESSION: Successful ultrasound-guided paracentesis yielding 3.2 liters of peritoneal fluid. Performed by Clotilda Hesselbach, PA-C Electronically Signed   By: Ester  Suttle M.D.   On: 10/01/2023 12:46   NM  PET Image Initial (PI) Skull Base To Thigh (F-18 FDG) Result Date: 09/25/2023 CLINICAL DATA:  Initial treatment strategy for omental and peritoneal nodularity compatible with carcinomatosis. EXAM: NUCLEAR MEDICINE PET SKULL BASE TO THIGH TECHNIQUE: 9.4 mCi F-18 FDG was injected intravenously. Full-ring PET imaging was performed from the skull base to thigh after the radiotracer. CT data was obtained and used for attenuation correction and anatomic localization. Fasting blood glucose: 116 mg/dl COMPARISON:  CT scan 1/74/7974 FINDINGS: Mediastinal blood pool activity: SUV max 2.4 Liver activity: SUV max NA NECK: No significant abnormal hypermetabolic activity in this region. Incidental CT findings: None. CHEST: Small type 1 hiatal hernia, accompanied by a small amount of ascites, faint metabolic activity in the ascites with maximum SUV 2.3, cannot exclude malignant ascites extension up through the hiatus. Incidental CT findings: Mild scarring or subsegmental atelectasis anteriorly in the right upper lobe. ABDOMEN/PELVIS: Heavy burden of hypermetabolic omental caking with malignant ascites and numerous foci of hypermetabolic activity along the liver capsule, right paracolic gutter, left paracolic gutter, mesentery, and pelvic ascites. A representative region of the omental caking in the left lower quadrant anterior to the descending colon a maximum SUV of 14.6. A hypermetabolic tumor deposit in the pelvic ascites anterior to the rectum on image 171 series 4 has maximum SUV of 16.0 and measures 2.7 cm in long axis. Hypermetabolic focus along the posterosuperior liver margin maximum SUV 7.8. Isolation of primary site is problematic given the tumor deposits along the cecum and expected location of the appendix as well as the adnexa. Overall moderate amount of malignant ascites. Incidental CT findings: None. SKELETON: No significant abnormal hypermetabolic activity in this region. Incidental CT findings: None. IMPRESSION:  1. Heavy burden of hypermetabolic omental caking with malignant ascites and numerous foci of hypermetabolic activity along the liver capsule, right paracolic gutter, left paracolic gutter, mesentery, and pelvic ascites. Isolation of primary site is problematic given the tumor deposits along the cecum and expected location of the appendix as well as the adnexa. Overall moderate amount of malignant ascites. 2. Small type 1 hiatal hernia, accompanied by a small amount of ascites, faint metabolic activity in the ascites with maximum SUV 2.3, cannot exclude malignant ascites extension up through the hiatus. Electronically Signed   By: Ryan Salvage M.D.   On: 09/25/2023 16:59   US  Paracentesis Result Date: 09/24/2023 INDICATION: other ascites Patient with history of bloating, abdominal distension, imaging findings of extensive omental and peritoneal nodularity, ascites; request received for diagnostic and therapeutic paracentesis. EXAM: ULTRASOUND GUIDED DIAGNOSTIC AND THERAPEUTIC PARACENTESIS MEDICATIONS: 8 mL 1% lidocaine  with epinephrine  COMPLICATIONS: None immediate. PROCEDURE: Informed written consent was obtained from the patient after a discussion of the risks, benefits and alternatives to treatment. A timeout was performed prior to the initiation of the procedure. Initial ultrasound scanning demonstrates a large amount of ascites within the LEFT lower abdominal quadrant. The left lower abdomen was prepped and draped in the usual sterile fashion. 1% lidocaine  was used for local anesthesia. Following this, a 6 Fr Safe-T-Centesis catheter was introduced. An ultrasound image was saved for documentation purposes. The paracentesis was performed. The catheter was removed and a dressing was applied. The patient tolerated the procedure well without immediate post procedural complication. FINDINGS: A total of approximately 4.7 L of hazy, blood-tinged fluid was removed. Samples were sent to the laboratory as  requested by the clinical team. IMPRESSION: Successful ultrasound-guided diagnostic and therapeutic paracentesis yielding 4.7  L of peritoneal fluid. Performed by: Franky Rakers, PA-C Electronically Signed   By: Thom Hall M.D.   On: 09/24/2023 17:10   CT ABDOMEN PELVIS W CONTRAST Result Date: 09/22/2023 CLINICAL DATA:  Bloating.  Distended abdomen. * Tracking Code: BO * EXAM: CT ABDOMEN AND PELVIS WITH CONTRAST TECHNIQUE: Multidetector CT imaging of the abdomen and pelvis was performed using the standard protocol following bolus administration of intravenous contrast. RADIATION DOSE REDUCTION: This exam was performed according to the departmental dose-optimization program which includes automated exposure control, adjustment of the mA and/or kV according to patient size and/or use of iterative reconstruction technique. CONTRAST:  100mL ISOVUE -300 IOPAMIDOL  (ISOVUE -300) INJECTION 61% COMPARISON:  None Available. FINDINGS: Lower chest: Small focus of atelectasis in the inferior lingula. No pulmonary nodules. Hepatobiliary: No focal hepatic lesion. Low-density gallstone noted. No sickle size. Pancreas: Pancreas is normal. No ductal dilatation. No pancreatic inflammation. Spleen: Normal spleen Adrenals/urinary tract: Adrenal glands and kidneys are normal. The ureters and bladder normal. Stomach/Bowel: Stomach is normal. The small bowel is floating on moderate volume intraperitoneal free fluid within the small bowel mesentery. No bowel obstruction. Terminal ileum is normal. Appendix not clearly identified. The colon is normal. Contrast travels the entirety of the colon to the rectum. Vascular/Lymphatic: Abdominal aorta is normal caliber. No periportal or retroperitoneal adenopathy. No pelvic adenopathy. Reproductive: Uterus and ovaries are grossly normal. Other: Moderate volume intraperitoneal free fluid. There is extensive nodularity the greater omentum (omental caking, image 58/2 for example). There are peritoneal  nodules noted. For example nodule adjacent the RIGHT hepatic lobe measuring 13 mm image 35/2. Peritoneal nodularity in the posterior cul-de-sac on image 84/2. Musculoskeletal: No aggressive osseous lesion. IMPRESSION: 1. 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 2. Moderate volume intraperitoneal free fluid. 3. No clear ovarian or appendiceal neoplasm. No clear primary malignancy identified. These results will be called to the ordering clinician or representative by the Radiologist Assistant, and communication documented in the PACS or [employer]. Electronically Signed   By: Jackquline Boxer M.D.   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(HCC)   10/04/2023 Tumor Marker   Patient's tumor was tested for the following markers: CA-125. Results of the tumor marker test revealed 305.   10/08/2023 Procedure   Successful ultrasound-guided diagnostic and therapeutic paracentesis yielding 3.1 liters of peritoneal fluid.   10/08/2023 Pathology Results   CYTOLOGY - NON PAP  CASE: [id number]  PATIENT: Ahlaya Santillo  Non-Gynecological Cytology Report      Clinical History: None provided  Specimen Submitted:  A. ASCITES, PARACENTESIS:   FINAL MICROSCOPIC DIAGNOSIS:  - Malignant cells present  - Adenocarcinoma cytomorphologically compatible with serous carcinoma     10/08/2023 Pathology Results   SURGICAL PATHOLOGY  CASE: [id number]  PATIENT: Sevin Mier  Surgical Pathology Report   Clinical History: None provided   FINAL MICROSCOPIC DIAGNOSIS:   A. PERITONEAL, LLQ, BIOPSY:  - Metastatic carcinoma, consistent with high-grade serous carcinoma (see comment).   Comment: Immunohistochemical stains are performed.  The tumor cells stain positive for WT1  and PAX8 (patchy), with overexpression of p53. Calretinin is negative.  The combined immunomorphologic features are consistent with high-grade serous carcinoma.    10/10/2023 Cancer Staging   Staging form: Ovary, Fallopian Tube, and Primary Peritoneal Carcinoma, AJCC 8th Edition - Clinical: FIGO Stage IIIC (cT3c,  cN0, cM0) - Signed by Lonn Hicks, MD on 10/10/2023 Stage prefix: Initial diagnosis   10/14/2023 Procedure   Successful ultrasound-guided therapeutic paracentesis yielding 4.4 L liters of peritoneal fluid   10/20/2023 -  Chemotherapy   Patient is on Treatment Plan : OVARIAN Carboplatin  (AUC 6) + Paclitaxel  (175) q21d X 6 Cycles

## 2023-10-14 NOTE — Progress Notes (Unsigned)
 Per Dr. Lonn, pt. to be admitted to hospital today for further evaluation/lab values and will start chemotherapy treatment while there. Pt. and spouse escorted to Emergency department via wheelchair. No respiratory distress noted.

## 2023-10-14 NOTE — ED Triage Notes (Signed)
 Pt reports having labs drawn this morning and her calcium was high. Pt is currently undergoing tx.

## 2023-10-14 NOTE — ED Notes (Signed)
 Pt ambulated to the restroom, attempted to collect ua in hat pan. Pt missed pan stating she has a prolapse.

## 2023-10-14 NOTE — Telephone Encounter (Addendum)
 CRITICAL VALUE STICKER  CRITICAL VALUE: Calcium 14  RECEIVER (on-site recipient of call): Harlene, RN  DATE & TIME NOTIFIED: 10/14/23 at 9:18am  MESSENGER (representative from lab): Aldona   MD NOTIFIED: Dr Lonn  TIME OF NOTIFICATION: 0920  RESPONSE: Dr Lonn responded that patient would be admitted due to hypercalcemia. See notes.

## 2023-10-14 NOTE — Progress Notes (Signed)
 Referring Physician(s): Gorsuch,N  Supervising Physician: Jenna Hacker  Patient Status:  [facility] - In-pt  Chief Complaint: Metastatic ovarian cancer; referred for Port-A-Cath placement to assist with treatment   Subjective: Patient familiar to IR service from multiple paracenteses, latest yesterday yielding 4.4 L of fluid.  She is a 58 year old female with past medical history significant for multinodular thyroid , prediabetes, sleep apnea and recently diagnosed metastatic ovarian cancer with recurrent malignant ascites.  She was on IR outpatient schedule tomorrow for Port-A-Cath placement.  She was seen in follow-up today by oncology and noted to be hypercalcemic.  She is now being admitted to the hospital for treatment.  Port-A-Cath placement now requested to be done while patient in house.  She currently denies fever, headache, chest pain, cough, or bleeding.  She does have abdominal distention/discomfort, occasional back pain, intermittent nausea /vomiting, fatigue, some dyspnea with exertion and constipation.   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   History of chlamydia    1990   Multiple thyroid  nodules    Prediabetes    Sleep apnea    Past Surgical History:  Procedure Laterality Date   INTRAUTERINE DEVICE INSERTION  07/18/2008   MIRENA   IR PARACENTESIS  10/01/2023   IR PARACENTESIS  10/06/2023   SALIVARY GLAND SURGERY     ABSCESS   URETHRAL SLING  1999   WREN      Allergies: Tree extract, Codeine, and Latex  Medications: Prior to Admission medications   Medication Sig Start Date End Date Taking? Authorizing Provider  dexamethasone  (DECADRON ) 4 MG tablet Take 2 tablets (8mg ) by mouth daily starting the day after carboplatin  for 3 days. Take with food 10/10/23  Yes Gorsuch, Ni, MD  lidocaine -prilocaine  (EMLA ) cream Apply to affected area once 10/10/23  Yes Gorsuch, Ni, MD  omeprazole (PRILOSEC OTC) 20 MG tablet Take 20 mg  by mouth daily before breakfast.   Yes [provider]  ondansetron  (ZOFRAN ) 8 MG tablet Take 1 tablet (8 mg total) by mouth every 8 (eight) hours as needed for nausea or vomiting. 10/03/23  Yes Lonn Hicks, MD  prochlorperazine  (COMPAZINE ) 10 MG tablet Take 1 tablet (10 mg total) by mouth every 6 (six) hours as needed for nausea or vomiting. 10/10/23  Yes Lonn, Ni, MD     Vital Signs: BP 120/75   Pulse 90   Temp 97.9 F (36.6 C) (Oral)   Resp (!) 21   SpO2 94%   Physical Exam: Awake, alert.  Chest with slightly diminished breath sounds at bases.  Heart with regular rate and rhythm.  Abdomen distended, positive bowel sounds, currently nontender.  1-2+ pretibial edema bilaterally.  Imaging: US  Paracentesis Result Date: 10/13/2023 INDICATION: recurrent ascites Patient with 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 Request received for therapeutic paracentesis. EXAM: ULTRASOUND GUIDED THERAPEUTIC PARACENTESIS MEDICATIONS: 8 mL 1% lidocaine  with epinephrine  to skin and subcutaneous tissue COMPLICATIONS: None immediate. PROCEDURE: Informed written consent was obtained from the patient after a discussion of the risks, benefits and alternatives to treatment. A timeout was performed prior to the initiation of the procedure. Initial ultrasound scanning demonstrates a moderate amount of ascites within the left lower abdominal quadrant. The left lower abdomen was prepped and draped in the usual sterile fashion. 1% lidocaine  was used for local anesthesia. Following this, a 6 Fr Safe-T-Centesis catheter was introduced. An ultrasound image was saved for documentation purposes. The paracentesis was performed. The catheter was removed and a  dressing was applied. The patient tolerated the procedure well without immediate post procedural complication. FINDINGS: A total of approximately 4.4 L of amber fluid was removed. IMPRESSION: Successful ultrasound-guided therapeutic  paracentesis yielding 4.4 L liters of peritoneal fluid. Performed by: Franky Rakers, PA-C under direct supervision of Thom Hall, MD Electronically Signed   By: Thom Hall M.D.   On: 10/13/2023 16:47    Labs:  CBC: Recent Labs    10/03/23 1353 10/08/23 0951 10/14/23 0809 10/14/23 1216  WBC 20.1* 18.3* 18.4* 18.7*  HGB 14.2 13.4 13.3 13.1  HCT 42.5 42.6 40.0 41.6  PLT 448* 442* 189 389    COAGS: Recent Labs    09/28/23 1618 10/08/23 0951  INR 1.1 1.2    BMP: Recent Labs    10/03/23 1353 10/08/23 0951 10/14/23 0809 10/14/23 1216  NA 129* 133* 127* 126*  K 4.7 4.8 5.2* 5.4*  CL 97* 99 94* 91*  CO2 24 22 30 26   GLUCOSE 212* 151* 160* 146*  BUN 26* 17 23* 21*  CALCIUM 11.3* 12.3* 14.0* 14.4*  CREATININE 0.78 0.64 0.83 0.83  GFRNONAA >60 >60 >60 >60    LIVER FUNCTION TESTS: Recent Labs    09/28/23 1618 10/03/23 1353 10/14/23 0809  BILITOT 0.5 0.5 1.1  AST 18 13* 32  ALT 10 8 19   ALKPHOS 86 83 135*  PROT 6.3* 6.5 5.4*  ALBUMIN 3.3* 3.2* 2.7*    Assessment and Plan: 58 year old female with past medical history significant for multinodular thyroid , prediabetes, sleep apnea and recently diagnosed metastatic ovarian cancer with recurrent malignant ascites.  She was on IR outpatient schedule tomorrow for Port-A-Cath placement.  She was seen in follow-up today by oncology and noted to be hypercalcemic.  She is now being admitted to the hospital for treatment.  Port-A-Cath placement now requested to be done while patient in house.Risks and benefits of image guided port-a-catheter placement was discussed with the patient /family including, but not limited to bleeding, infection, pneumothorax, or fibrin sheath development and need for additional procedures.  All of the patient's questions were answered, patient is agreeable to proceed. Consent signed and in IR suite.  Latest labs include WBC 18.7, hemoglobin normal, platelets normal, calcium 14.4, potassium 5.4.  Will  continue to monitor patient's lab values and proceed with port placement via IV conscious sedation once stable.    Electronically Signed: D. Franky Rakers, PA-C 10/14/2023, 3:16 PM   I spent a total of 20 minutes at the the patient's bedside AND on the patient's hospital floor or unit, greater than 50% of which was counseling/coordinating care for port a cath placement    Patient ID: Miranda Hill, female   DOB: [birth year], 58 y.o.   MRN: [id number]

## 2023-10-15 ENCOUNTER — Inpatient Hospital Stay (HOSPITAL_COMMUNITY)
Admission: RE | Admit: 2023-10-15 | Discharge: 2023-10-15 | Disposition: A | Discharge status: Home / Self Care | Source: Ambulatory Visit | Attending: Hematology and Oncology | Admitting: Hematology and Oncology

## 2023-10-15 ENCOUNTER — Ambulatory Visit (HOSPITAL_COMMUNITY)

## 2023-10-15 ENCOUNTER — Encounter (HOSPITAL_COMMUNITY): Payer: Self-pay

## 2023-10-15 LAB — COMPREHENSIVE METABOLIC PANEL WITH GFR
ALT: 47 U/L — ABNORMAL HIGH (ref 0–44)
AST: 70 U/L — ABNORMAL HIGH (ref 15–41)
Albumin: 2.7 g/dL — ABNORMAL LOW (ref 3.5–5.0)
Alkaline Phosphatase: 160 U/L — ABNORMAL HIGH (ref 38–126)
Anion gap: 8 (ref 5–15)
BUN: 23 mg/dL — ABNORMAL HIGH (ref 6–20)
CO2: 27 mmol/L (ref 22–32)
Calcium: 13.9 mg/dL (ref 8.9–10.3)
Chloride: 98 mmol/L (ref 98–111)
Creatinine, Ser: 0.7 mg/dL (ref 0.44–1.00)
GFR, Estimated: >60 mL/min (ref >60)
Glucose, Bld: 160 mg/dL — ABNORMAL HIGH (ref 70–99)
Potassium: 4.7 mmol/L (ref 3.5–5.1)
Sodium: 133 mmol/L — ABNORMAL LOW (ref 135–145)
Total Bilirubin: 1.1 mg/dL (ref 0.0–1.2)
Total Protein: 5.1 g/dL — ABNORMAL LOW (ref 6.5–8.1)

## 2023-10-15 LAB — CBC
HCT: 40.3 % (ref 36.0–46.0)
Hemoglobin: 12.3 g/dL (ref 12.0–15.0)
MCH: 26.8 pg (ref 26.0–34.0)
MCHC: 30.5 g/dL (ref 30.0–36.0)
MCV: 87.8 fL (ref 80.0–100.0)
Platelets: 344 K/uL (ref 150–400)
RBC: 4.59 MIL/uL (ref 3.87–5.11)
RDW: 14.2 % (ref 11.5–15.5)
WBC: 13.5 K/uL — ABNORMAL HIGH (ref 4.0–10.5)
nRBC: 0 % (ref 0.0–0.2)

## 2023-10-15 LAB — URIC ACID: Uric Acid, Serum: 8 mg/dL — ABNORMAL HIGH (ref 2.5–7.1)

## 2023-10-15 LAB — PROTIME-INR
INR: 1.2 (ref 0.8–1.2)
Prothrombin Time: 15.7 s — ABNORMAL HIGH (ref 11.4–15.2)

## 2023-10-15 LAB — HIV ANTIBODY (ROUTINE TESTING W REFLEX): HIV Screen 4th Generation wRfx: NONREACTIVE

## 2023-10-15 LAB — CA 125: Cancer Antigen (CA) 125: 164 U/mL — ABNORMAL HIGH (ref 0.0–38.1)

## 2023-10-15 MED ORDER — FUROSEMIDE 10 MG/ML IJ SOLN
20.0000 mg | Freq: Once | INTRAMUSCULAR | Status: AC
  Administered 2023-10-15: 20 mg via INTRAVENOUS
  Filled 2023-10-15: qty 2

## 2023-10-15 MED ORDER — POLYETHYLENE GLYCOL 3350 17 G PO PACK
17.0000 g | PACK | Freq: Two times a day (BID) | ORAL | Status: DC
  Administered 2023-10-15 – 2023-10-18 (×5): 17 g via ORAL
  Filled 2023-10-15 (×7): qty 1

## 2023-10-15 MED ORDER — RASBURICASE 1.5 MG IV SOLR
3.0000 mg | Freq: Once | INTRAVENOUS | Status: AC
  Administered 2023-10-15: 3 mg via INTRAVENOUS
  Filled 2023-10-15: qty 2

## 2023-10-15 MED ORDER — ZOLEDRONIC ACID 4 MG/100ML IV SOLN
4.0000 mg | Freq: Once | INTRAVENOUS | Status: AC
  Administered 2023-10-15: 4 mg via INTRAVENOUS
  Filled 2023-10-15: qty 100

## 2023-10-15 NOTE — Plan of Care (Signed)

## 2023-10-15 NOTE — Progress Notes (Addendum)
 PROGRESS NOTE  Miranda Hill  FMW:[id number] DOB: Apr 05, 1965 DOA: 10/14/2023 PCP: [name], FNP   Brief Narrative: Patient is a 58 year old female with history of recent diagnosis of peritoneal carcinomatosis due to suspected ovarian cancer, prediabetes, sleep apnea who presented for the evaluation of elevated calcium level.  She follows with Dr. Lonn.  Outpatient workup showed hypercalcemia.  No report of confusion.  Report of constipation.  Admitted for the management of hypercalcemia of malignancy, plan to start on chemotherapy.  Currently on IV fluid and calcitonin  Assessment & Plan:  Principal Problem:   Hypercalcemia of malignancy Active Problems:   Hypercalcemia   Other constipation  Hypercalcemia of malignancy: History of ovarian neoplasm, peritoneal carcinomatosis.  Presented with calcium in the range of 14. Her calcium is 12.8 today but corrected calcium is 14.2(albumin of 2.7). Currently on IV fluid, calcitonin.  She might be a candidate for Zometa . Continue to monitor calcium level  Metastatic ovarian cancer: High-grade carcinoma on pathology, elevated CA125.  Oncology following.  Concern for peritoneal carcinomatosis.  Oncology planning to start on chemotherapy tomorrow.  Plan for Port-A-Cath placement  Malignant ascites: Had paracentesis on 9/15 with removal of 4.4 L of ascitic fluid  Constipation: Continue aggressive bowel regimen  Leukocytosis: Most likely reactive.  Improving.  Low suspicion for infectious process  Obesity: BMI of 32.3         DVT prophylaxis:enoxaparin  (LOVENOX ) injection 40 mg Start: 10/14/23 2200     Code Status: Full Code  Family Communication: Husband at bedside on 9/17  Patient status:Inpatient  Patient is from :Home  Anticipated discharge un:Ynfz  Estimated DC date:2-3 days   Consultants: Oncology  Procedures:None  Antimicrobials:  Anti-infectives (From admission, onward)    None        Subjective: Patient seen and examined at bedside today.  As per the report, IR canceled Port-A-Cath placement.  She was eating food when I arrived.  No nausea, vomiting or abdominal pain.  Abdomen is mildly distended but not tense or tender.  No bowel movement for last few days.  Alert and oriented  Objective: Vitals:   10/14/23 1633 10/14/23 2035 10/15/23 0012 10/15/23 0512  BP: 127/77 118/70 126/75 122/77  Pulse: 90 85 77 86  Resp: 15 18  18   Temp: 98 F (36.7 C) 97.8 F (36.6 C)  (!) 97.5 F (36.4 C)  TempSrc: Oral Oral  Oral  SpO2: 93% 93% 96% 95%  Weight: 80.3 kg     Height: 5' 2 (1.575 m)       Intake/Output Summary (Last 24 hours) at 10/15/2023 1025 Last data filed at 10/15/2023 0700 Gross per 24 hour  Intake 1000 ml  Output 650 ml  Net 350 ml   Filed Weights   10/14/23 1633  Weight: 80.3 kg    Examination:  General exam: Overall comfortable, not in distress, obese HEENT: PERRL Respiratory system:  no wheezes or crackles  Cardiovascular system: S1 & S2 heard, RRR.  Gastrointestinal system: Abdomen is nondistended, soft and nontender. Central nervous system: Alert and oriented Extremities: No edema, no clubbing ,no cyanosis Skin: No rashes, no ulcers,no icterus     Data Reviewed: I have personally reviewed following labs and imaging studies  CBC: Recent Labs  Lab 10/14/23 0809 10/14/23 1216 10/15/23 0550  WBC 18.4* 18.7* 13.5*  NEUTROABS 16.3*  --   --   HGB 13.3 13.1 12.3  HCT 40.0 41.6 40.3  MCV 82.8 84.7 87.8  PLT 189 389 344  Basic Metabolic Panel: Recent Labs  Lab 10/14/23 0809 10/14/23 1216 10/14/23 2120 10/15/23 0550  NA 127* 126* 130* 133*  K 5.2* 5.4* 4.9 4.7  CL 94* 91* 96* 98  CO2 30 26 24 27   GLUCOSE 160* 146* 195* 160*  BUN 23* 21* 24* 23*  CREATININE 0.83 0.83 0.84 0.70  CALCIUM 14.0* 14.4* 12.8* 13.9*  MG  --  2.5*  --   --      No results found for this or any previous visit (from the past 240 hours).    Radiology Studies: US  Paracentesis Result Date: 10/13/2023 INDICATION: recurrent ascites Patient with 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 Request received for therapeutic paracentesis. EXAM: ULTRASOUND GUIDED THERAPEUTIC PARACENTESIS MEDICATIONS: 8 mL 1% lidocaine  with epinephrine  to skin and subcutaneous tissue COMPLICATIONS: None immediate. PROCEDURE: Informed written consent was obtained from the patient after a discussion of the risks, benefits and alternatives to treatment. A timeout was performed prior to the initiation of the procedure. Initial ultrasound scanning demonstrates a moderate amount of ascites within the left lower abdominal quadrant. The left lower abdomen was prepped and draped in the usual sterile fashion. 1% lidocaine  was used for local anesthesia. Following this, a 6 Fr Safe-T-Centesis catheter was introduced. An ultrasound image was saved for documentation purposes. The paracentesis was performed. The catheter was removed and a dressing was applied. The patient tolerated the procedure well without immediate post procedural complication. FINDINGS: A total of approximately 4.4 L of amber fluid was removed. IMPRESSION: Successful ultrasound-guided therapeutic paracentesis yielding 4.4 L liters of peritoneal fluid. Performed by: Franky Rakers, PA-C under direct supervision of Thom Hall, MD Electronically Signed   By: Thom Hall M.D.   On: 10/13/2023 16:47    Scheduled Meds:  calcitonin (salmon)  1 spray Alternating Nares Daily   dexamethasone   12 mg Oral Daily   enoxaparin  (LOVENOX ) injection  40 mg Subcutaneous QHS   feeding supplement  237 mL Oral BID BM   pantoprazole   40 mg Oral QAC breakfast   senna-docusate  2 tablet Oral BID   Continuous Infusions:  sodium chloride  150 mL/hr at 10/14/23 1643     LOS: 1 day   Ivonne Mustache, MD [facility] P9/17/2025, 10:25 AM

## 2023-10-16 DIAGNOSIS — C801 Malignant (primary) neoplasm, unspecified: Secondary | ICD-10-CM

## 2023-10-16 DIAGNOSIS — R180 Malignant ascites: Secondary | ICD-10-CM

## 2023-10-16 DIAGNOSIS — E871 Hypo-osmolality and hyponatremia: Secondary | ICD-10-CM

## 2023-10-16 DIAGNOSIS — K5900 Constipation, unspecified: Secondary | ICD-10-CM

## 2023-10-16 DIAGNOSIS — E860 Dehydration: Secondary | ICD-10-CM

## 2023-10-16 DIAGNOSIS — C786 Secondary malignant neoplasm of retroperitoneum and peritoneum: Secondary | ICD-10-CM | POA: Diagnosis not present

## 2023-10-16 LAB — COMPREHENSIVE METABOLIC PANEL WITH GFR
ALT: 107 U/L — ABNORMAL HIGH (ref 0–44)
AST: 126 U/L — ABNORMAL HIGH (ref 15–41)
Albumin: 2.8 g/dL — ABNORMAL LOW (ref 3.5–5.0)
Alkaline Phosphatase: 278 U/L — ABNORMAL HIGH (ref 38–126)
Anion gap: 10 (ref 5–15)
BUN: 25 mg/dL — ABNORMAL HIGH (ref 6–20)
CO2: 23 mmol/L (ref 22–32)
Calcium: 13.7 mg/dL (ref 8.9–10.3)
Chloride: 98 mmol/L (ref 98–111)
Creatinine, Ser: 0.76 mg/dL (ref 0.44–1.00)
GFR, Estimated: >60 mL/min (ref >60)
Glucose, Bld: 184 mg/dL — ABNORMAL HIGH (ref 70–99)
Potassium: 5 mmol/L (ref 3.5–5.1)
Sodium: 131 mmol/L — ABNORMAL LOW (ref 135–145)
Total Bilirubin: 1.1 mg/dL (ref 0.0–1.2)
Total Protein: 5.2 g/dL — ABNORMAL LOW (ref 6.5–8.1)

## 2023-10-16 LAB — CBC
HCT: 38.2 % (ref 36.0–46.0)
Hemoglobin: 11.9 g/dL — ABNORMAL LOW (ref 12.0–15.0)
MCH: 27.4 pg (ref 26.0–34.0)
MCHC: 31.2 g/dL (ref 30.0–36.0)
MCV: 88 fL (ref 80.0–100.0)
Platelets: 393 K/uL (ref 150–400)
RBC: 4.34 MIL/uL (ref 3.87–5.11)
RDW: 14.2 % (ref 11.5–15.5)
WBC: 25 K/uL — ABNORMAL HIGH (ref 4.0–10.5)
nRBC: 0 % (ref 0.0–0.2)

## 2023-10-16 LAB — CALCIUM, IONIZED: Calcium, Ionized, Serum: 8.1 mg/dL — ABNORMAL HIGH (ref 4.5–5.6)

## 2023-10-16 LAB — SURGICAL PATHOLOGY

## 2023-10-16 LAB — RASBURICASE - URIC ACID: Uric Acid, Serum: 2.4 mg/dL — ABNORMAL LOW (ref 2.5–7.1)

## 2023-10-16 MED ORDER — SODIUM CHLORIDE 0.9 % IV SOLN: INTRAVENOUS | Status: AC

## 2023-10-16 MED ORDER — CETIRIZINE HCL 10 MG/ML IV SOLN
10.0000 mg | Freq: Once | INTRAVENOUS | Status: AC
  Administered 2023-10-16: 10 mg via INTRAVENOUS
  Filled 2023-10-16: qty 1

## 2023-10-16 MED ORDER — SODIUM CHLORIDE 0.9 % IV SOLN
175.0000 mg/m2 | Freq: Once | INTRAVENOUS | Status: AC
  Administered 2023-10-16: 324 mg via INTRAVENOUS
  Filled 2023-10-16: qty 54

## 2023-10-16 MED ORDER — FAMOTIDINE IN NACL 20-0.9 MG/50ML-% IV SOLN
20.0000 mg | Freq: Once | INTRAVENOUS | Status: AC
  Administered 2023-10-16: 20 mg via INTRAVENOUS
  Filled 2023-10-16: qty 50

## 2023-10-16 MED ORDER — PALONOSETRON HCL INJECTION 0.25 MG/5ML
0.2500 mg | Freq: Once | INTRAVENOUS | Status: AC
  Administered 2023-10-16: 0.25 mg via INTRAVENOUS
  Filled 2023-10-16: qty 5

## 2023-10-16 MED ORDER — SODIUM CHLORIDE 0.9 % IV SOLN: INTRAVENOUS | Status: DC

## 2023-10-16 MED ORDER — DIPHENHYDRAMINE HCL 50 MG/ML IJ SOLN
25.0000 mg | Freq: Once | INTRAMUSCULAR | Status: AC
  Administered 2023-10-16: 25 mg via INTRAVENOUS
  Filled 2023-10-16: qty 1

## 2023-10-16 MED ORDER — COLD PACK MISC ONCOLOGY: 1.0000 | Freq: Once | Status: AC | PRN

## 2023-10-16 MED ORDER — SODIUM CHLORIDE 0.9 % IV SOLN
718.2000 mg | Freq: Once | INTRAVENOUS | Status: AC
  Administered 2023-10-16: 720 mg via INTRAVENOUS
  Filled 2023-10-16: qty 72

## 2023-10-16 MED ORDER — DEXAMETHASONE SODIUM PHOSPHATE 10 MG/ML IJ SOLN
10.0000 mg | Freq: Once | INTRAMUSCULAR | Status: AC
  Administered 2023-10-16: 10 mg via INTRAVENOUS
  Filled 2023-10-16: qty 1

## 2023-10-16 MED ORDER — SODIUM CHLORIDE 0.9 % IV SOLN
150.0000 mg | Freq: Once | INTRAVENOUS | Status: AC
  Administered 2023-10-16: 150 mg via INTRAVENOUS
  Filled 2023-10-16: qty 5

## 2023-10-16 MED ORDER — APREPITANT 130 MG/18ML IV EMUL: 130.0000 mg | Freq: Once | INTRAVENOUS | Status: DC

## 2023-10-16 NOTE — Progress Notes (Signed)
 Miranda Hill   DOB:1965/11/23   FM#:[id number]    ASSESSMENT & PLAN:  Malignant neoplasm of ovary, unspecified laterality [facility]) The patient has developed malignant ascites and abdominal carcinomatosis, source is unknown, suspect GYN primary Pathology from recent biopsy confirmed high-grade serous carcinoma, together with elevated CA125, raises suspicion that she might have ovarian cancer She has completed chemo education class Due to severe malignant hypercalcemia, she will receive inpatient chemotherapy while hospitalized, to start today  Malignant hypercalcemia We discussed the danger of untreated hypercalcemia I recommend aggressive IV fluid resuscitation I will start her on daily dexamethasone  and nasal calcitonin She received 1 dose of Zometa  on October 15, 2023 Resume IV fluids after completion of chemotherapy later today  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 She had paracentesis on Monday With rapid fluid accumulation, she will likely need another paracentesis tomorrow  dehydration, hyponatremia Her recent labs suggest hyponatremia and elevated calcium likely due to dehydration She will continue IV fluids as above  Constipation Could be due to hypercalcemia Will start aggressive laxatives  Goals of care discussion She will need inpatient chemotherapy Risks of not treating her with the death I have attempted to try to get her treated as soon as I can but unfortunately with her severe hypercalcemia, she needs to be treated in the hospital  Discharge planning Likely early next week   Miranda Bedford, MD 10/16/2023 11:22 AM  Subjective:  She complained of feeling weak overall.  No nausea. Persistent laboratory abnormalities with additional worsening liver enzymes She has progressive abdominal distention due to aggressive IV fluid resuscitation   Objective:  Vitals:   10/15/23 2138 10/16/23 0559  BP: 115/65 134/76  Pulse: 95 96  Resp: 18 18   Temp: 98.3 F (36.8 C) 98.1 F (36.7 C)  SpO2: 94% 92%    No intake or output data in the 24 hours ending 10/16/23 1122

## 2023-10-16 NOTE — Progress Notes (Signed)
   10/16/23 1529  TOC Brief Assessment  Insurance and Status Reviewed  Patient has primary care physician Yes Maurita, Prentice SAUNDERS, FNP)  Home environment has been reviewed Home with husband  Prior level of function: independent  Prior/Current Home Services No current home services  Social Drivers of Health Review SDOH reviewed no interventions necessary  Readmission risk has been reviewed Yes  Transition of care needs no transition of care needs at this time

## 2023-10-16 NOTE — Plan of Care (Signed)

## 2023-10-16 NOTE — Progress Notes (Signed)
 OK to proceed with treatment using peripheral IV and elevated AST/ALT per Dr Lonn

## 2023-10-16 NOTE — Progress Notes (Signed)
 PROGRESS NOTE  Miranda Hill  FMW:[id number] DOB: 1965/03/13 DOA: 10/14/2023 PCP: [name], FNP   Brief Narrative: Patient is a 58 year old female with history of recent diagnosis of peritoneal carcinomatosis due to suspected ovarian cancer, prediabetes, sleep apnea who presented for the evaluation of elevated calcium level.  She follows with Dr. Lonn.  Outpatient workup showed hypercalcemia.  No report of confusion.  Report of constipation.  Admitted for the management of hypercalcemia of malignancy, plan to start on chemotherapy.  Currently on IV fluid , calcitonin,given zometa   Assessment & Plan:  Principal Problem:   Hypercalcemia of malignancy Active Problems:   Hypercalcemia   Other constipation  Hypercalcemia of malignancy: History of ovarian neoplasm, peritoneal carcinomatosis.  Presented with calcium in the range of 14. Her calcium is 13.7 today. Currently on IV fluid, calcitonin.  Given  Zometa  on 9/17. Continue to monitor calcium level  Metastatic ovarian cancer: High-grade carcinoma on pathology, elevated CA125.  Oncology following.  Concern for peritoneal carcinomatosis.  Oncology planning to start on chemotherapy today.  Plan for Port-A-Cath placement but IR not doing unless calcium level is better  Elevated liver enzymes: Likely secondary to metastatic ovarian cancer.  Continue to monitor  Malignant ascites: Had paracentesis on 9/15 with removal of 4.4 L of ascitic fluid.  Will check if she needs repeat paracentesis in next few days  Constipation: Continue aggressive bowel regimen  Leukocytosis: Most likely reactive.  Improving.  Low suspicion for infectious process  Obesity: BMI of 32.3  Deconditioning/weakness: Will consult PT when appropriate         DVT prophylaxis:enoxaparin  (LOVENOX ) injection 40 mg Start: 10/14/23 2200     Code Status: Full Code  Family Communication: Husband at bedside on 9/18  Patient status:Inpatient  Patient is  from :Home  Anticipated discharge un:Ynfz  Estimated DC date:2-3 days   Consultants: Oncology  Procedures:None  Antimicrobials:  Anti-infectives (From admission, onward)    None       Subjective: Patient seen and examined at bedside today.  Hemodynamically stable.  Complains of weakness.  Abdomen slightly distended today.  Alert and oriented.  Plan to start  on chemotherapy.  Objective: Vitals:   10/15/23 0512 10/15/23 1334 10/15/23 2138 10/16/23 0559  BP: 122/77 117/74 115/65 134/76  Pulse: 86 99 95 96  Resp: 18 20 18 18   Temp: (!) 97.5 F (36.4 C) (!) 97.2 F (36.2 C) 98.3 F (36.8 C) 98.1 F (36.7 C)  TempSrc: Oral Oral    SpO2: 95% 93% 94% 92%  Weight:      Height:       No intake or output data in the 24 hours ending 10/16/23 1108  Filed Weights   10/14/23 1633  Weight: 80.3 kg    Examination:   General exam: Overall comfortable, not in distress,obese, appears weak HEENT: PERRL Respiratory system:  no wheezes or crackles  Cardiovascular system: S1 & S2 heard, RRR.  Gastrointestinal system: Abdomen is distended, soft and nontender.Ascites Central nervous system: Alert and oriented Extremities: No edema, no clubbing ,no cyanosis Skin: No rashes, no ulcers,no icterus      Data Reviewed: I have personally reviewed following labs and imaging studies  CBC: Recent Labs  Lab 10/14/23 0809 10/14/23 1216 10/15/23 0550 10/16/23 0557  WBC 18.4* 18.7* 13.5* 25.0*  NEUTROABS 16.3*  --   --   --   HGB 13.3 13.1 12.3 11.9*  HCT 40.0 41.6 40.3 38.2  MCV 82.8 84.7 87.8 88.0  PLT 189  389 344 393   Basic Metabolic Panel: Recent Labs  Lab 10/14/23 0809 10/14/23 1216 10/14/23 2120 10/15/23 0550 10/16/23 0557  NA 127* 126* 130* 133* 131*  K 5.2* 5.4* 4.9 4.7 5.0  CL 94* 91* 96* 98 98  CO2 30 26 24 27 23   GLUCOSE 160* 146* 195* 160* 184*  BUN 23* 21* 24* 23* 25*  CREATININE 0.83 0.83 0.84 0.70 0.76  CALCIUM 14.0* 14.4* 12.8* 13.9* 13.7*  MG  --   2.5*  --   --   --      No results found for this or any previous visit (from the past 240 hours).   Radiology Studies: No results found.   Scheduled Meds:  calcitonin (salmon)  1 spray Alternating Nares Daily   CARBOplatin   720 mg Intravenous Once   cetirizine   10 mg Intravenous Once   dexamethasone  (DECADRON ) IVPB ([facility])  10 mg Intravenous Once   dexamethasone   12 mg Oral Daily   diphenhydrAMINE   25 mg Intravenous Once   enoxaparin  (LOVENOX ) injection  40 mg Subcutaneous QHS   feeding supplement  237 mL Oral BID BM   PACLitaxel   175 mg/m2 (Treatment Plan Recorded) Intravenous Once   palonosetron   0.25 mg Intravenous Once   pantoprazole   40 mg Oral QAC breakfast   polyethylene glycol  17 g Oral BID   senna-docusate  2 tablet Oral BID   Continuous Infusions:  sodium chloride      famotidine  (PEPCID ) IV (ONCOLOGY)     fosaprepitant  (EMEND ) 150 mg in sodium chloride  0.9 % 145 mL IVPB       LOS: 2 days   Ivonne Mustache, MD [facility] P9/18/2025, 11:08 AM

## 2023-10-17 ENCOUNTER — Encounter: Payer: Self-pay | Admitting: Hematology and Oncology

## 2023-10-17 ENCOUNTER — Inpatient Hospital Stay (HOSPITAL_COMMUNITY)

## 2023-10-17 DIAGNOSIS — C786 Secondary malignant neoplasm of retroperitoneum and peritoneum: Secondary | ICD-10-CM | POA: Diagnosis not present

## 2023-10-17 DIAGNOSIS — C801 Malignant (primary) neoplasm, unspecified: Secondary | ICD-10-CM | POA: Diagnosis not present

## 2023-10-17 DIAGNOSIS — R180 Malignant ascites: Secondary | ICD-10-CM | POA: Diagnosis not present

## 2023-10-17 DIAGNOSIS — E790 Hyperuricemia without signs of inflammatory arthritis and tophaceous disease: Secondary | ICD-10-CM

## 2023-10-17 DIAGNOSIS — R971 Elevated cancer antigen 125 [CA 125]: Secondary | ICD-10-CM | POA: Diagnosis not present

## 2023-10-17 DIAGNOSIS — R7401 Elevation of levels of liver transaminase levels: Secondary | ICD-10-CM

## 2023-10-17 HISTORY — PX: IR IMAGING GUIDED PORT INSERTION: IMG5740

## 2023-10-17 HISTORY — PX: IR PARACENTESIS: IMG2679

## 2023-10-17 LAB — COMPREHENSIVE METABOLIC PANEL WITH GFR
ALT: 114 U/L — ABNORMAL HIGH (ref 0–44)
AST: 100 U/L — ABNORMAL HIGH (ref 15–41)
Albumin: 2.6 g/dL — ABNORMAL LOW (ref 3.5–5.0)
Alkaline Phosphatase: 391 U/L — ABNORMAL HIGH (ref 38–126)
Anion gap: 11 (ref 5–15)
BUN: 25 mg/dL — ABNORMAL HIGH (ref 6–20)
CO2: 19 mmol/L — ABNORMAL LOW (ref 22–32)
Calcium: 10.8 mg/dL — ABNORMAL HIGH (ref 8.9–10.3)
Chloride: 98 mmol/L (ref 98–111)
Creatinine, Ser: 0.57 mg/dL (ref 0.44–1.00)
GFR, Estimated: >60 mL/min (ref >60)
Glucose, Bld: 160 mg/dL — ABNORMAL HIGH (ref 70–99)
Potassium: 4.8 mmol/L (ref 3.5–5.1)
Sodium: 127 mmol/L — ABNORMAL LOW (ref 135–145)
Total Bilirubin: 1.1 mg/dL (ref 0.0–1.2)
Total Protein: 4.6 g/dL — ABNORMAL LOW (ref 6.5–8.1)

## 2023-10-17 LAB — CBC
HCT: 37.5 % (ref 36.0–46.0)
Hemoglobin: 10.9 g/dL — ABNORMAL LOW (ref 12.0–15.0)
MCH: 26.5 pg (ref 26.0–34.0)
MCHC: 29.1 g/dL — ABNORMAL LOW (ref 30.0–36.0)
MCV: 91.2 fL (ref 80.0–100.0)
Platelets: 297 K/uL (ref 150–400)
RBC: 4.11 MIL/uL (ref 3.87–5.11)
RDW: 14.5 % (ref 11.5–15.5)
WBC: 17.2 K/uL — ABNORMAL HIGH (ref 4.0–10.5)
nRBC: 0 % (ref 0.0–0.2)

## 2023-10-17 MED ORDER — LIDOCAINE-EPINEPHRINE 1 %-1:100000 IJ SOLN
INTRAMUSCULAR | Status: AC
  Filled 2023-10-17: qty 1

## 2023-10-17 MED ORDER — CEFAZOLIN SODIUM-DEXTROSE 2-4 GM/100ML-% IV SOLN
2.0000 g | INTRAVENOUS | Status: AC
  Administered 2023-10-17: 2 g via INTRAVENOUS

## 2023-10-17 MED ORDER — LIDOCAINE HCL 1 % IJ SOLN
INTRAMUSCULAR | Status: AC
  Filled 2023-10-17: qty 20

## 2023-10-17 MED ORDER — MIDAZOLAM HCL 2 MG/2ML IJ SOLN
INTRAMUSCULAR | Status: AC
  Filled 2023-10-17: qty 2

## 2023-10-17 MED ORDER — FENTANYL CITRATE (PF) 100 MCG/2ML IJ SOLN
INTRAMUSCULAR | Status: AC | PRN
  Administered 2023-10-17: 50 ug via INTRAVENOUS

## 2023-10-17 MED ORDER — SODIUM CHLORIDE 0.9 % IV SOLN: INTRAVENOUS | Status: DC

## 2023-10-17 MED ORDER — CEFAZOLIN SODIUM-DEXTROSE 2-4 GM/100ML-% IV SOLN
INTRAVENOUS | Status: AC
  Filled 2023-10-17: qty 100

## 2023-10-17 MED ORDER — MIDAZOLAM HCL 2 MG/2ML IJ SOLN
INTRAMUSCULAR | Status: AC | PRN
  Administered 2023-10-17: 1 mg via INTRAVENOUS

## 2023-10-17 MED ORDER — FENTANYL CITRATE (PF) 100 MCG/2ML IJ SOLN
INTRAMUSCULAR | Status: AC
  Filled 2023-10-17: qty 2

## 2023-10-17 MED ORDER — LIDOCAINE-EPINEPHRINE 1 %-1:100000 IJ SOLN
20.0000 mL | Freq: Once | INTRAMUSCULAR | Status: AC
  Filled 2023-10-17: qty 20

## 2023-10-17 MED ORDER — LIDOCAINE HCL 1 % IJ SOLN
20.0000 mL | Freq: Once | INTRAMUSCULAR | Status: AC
Start: 2023-10-17 — End: 2023-10-17
  Filled 2023-10-17: qty 20

## 2023-10-17 NOTE — Progress Notes (Signed)
 Miranda Hill   DOB:[birth year]   FM#:[id number]    ASSESSMENT & PLAN:  Malignant neoplasm of ovary, unspecified laterality [facility]) The patient has developed malignant ascites and abdominal carcinomatosis, source is unknown, suspect GYN primary Pathology from recent biopsy confirmed high-grade serous carcinoma, together with elevated CA125, raises suspicion that she might have ovarian cancer She received cycle 1 day 1 of treatment on October 16, 2023, tolerated well so far  Malignant hypercalcemia, resolving Overall, calcium level is improving although corrected calcium is still high She received 1 dose of Zometa  on October 15, 2023 Continue IV fluids today I will discontinue oral dexamethasone , she has received it over the past few days since admission Continue calcitonin nasal spray Continue to trend her calcium level over the next few days  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 She had paracentesis on Monday With rapid fluid accumulation, she will likely need another paracentesis today, will request procedure from interventional radiologist  dehydration, hyponatremia Her recent labs suggest hyponatremia and elevated calcium likely due to dehydration She will continue IV fluids as above  Constipation Could be due to hypercalcemia Overall resolving  Elevated liver enzymes Likely due to her treatment Observe only  Hyperuricemia Status post 1 dose of rasburicase  on September 17, resolved  Discharge planning Hopefully next few days, if calcium level continue to improve I will arrange outpatient follow-up next week  Almarie Bedford, MD 10/17/2023 8:37 AM  Subjective:  Completed cycle 1 day 1 of chemotherapy on October 16, 2023.  Tolerated treatment very well so far.  Denies nausea.  Constipation has resolved.  Feeling good overall.  Has numerous questions related to timing of port placement.  We discussed possibility of discharge over the weekend and  future follow-up  Objective:  Vitals:   10/16/23 2103 10/17/23 0536  BP: 115/74 109/66  Pulse: 84 82  Resp: 16 17  Temp: (!) 97.4 F (36.3 C) 98.1 F (36.7 C)  SpO2: 94% 92%     Intake/Output Summary (Last 24 hours) at 10/17/2023 0837 Last data filed at 10/17/2023 0552 Gross per 24 hour  Intake 1379.17 ml  Output --  Net 1379.17 ml

## 2023-10-17 NOTE — Progress Notes (Signed)
 PROGRESS NOTE  Miranda Hill  FMW:[id number] DOB: [birth year] DOA: 10/14/2023 PCP: [name], FNP   Brief Narrative: Patient is a 58 year old female with history of recent diagnosis of peritoneal carcinomatosis due to suspected ovarian cancer, prediabetes, sleep apnea who presented for the evaluation of elevated calcium level.  She follows with Dr. Lonn.  Outpatient workup showed hypercalcemia.  No report of confusion.  Report of constipation.  Admitted for the management of hypercalcemia of malignancy.  Given calcitonin, Zometa , IV fluid for hypercalcemia.  Given chemotherapy on 9/18.  Plan for Port-A-Cath placement and paracentesis today.  Possible discharge home tomorrow.  Assessment & Plan:  Principal Problem:   Hypercalcemia of malignancy Active Problems:   Hypercalcemia   Other constipation  Hypercalcemia of malignancy: History of ovarian neoplasm, peritoneal carcinomatosis.  Presented with calcium in the range of 14. Her calcium is in the range of 10 today.  Given  Zometa  on 9/17. Continue to monitor calcium level  Metastatic ovarian cancer: High-grade carcinoma on pathology, elevated CA125.  Oncology following.  Concern for peritoneal carcinomatosis.  Oncology gave chemotherapy on 9/18.  Plan for Port-A-Cath placement today.  Elevated liver enzymes: Likely secondary to metastatic ovarian cancer.  Continue to monitor  Malignant ascites: Had paracentesis on 9/15 with removal of 4.4 L of ascitic fluid.  Plan for another paracentesis today  Constipation: Continue aggressive bowel regimen.  Having bowel movements  Leukocytosis: Most likely reactive.  Improving.  Low suspicion for infectious process  Obesity: BMI of 32.3  Deconditioning/weakness: Will consult PT         DVT prophylaxis:enoxaparin  (LOVENOX ) injection 40 mg Start: 10/14/23 2200     Code Status: Full Code  Family Communication: Husband at bedside on 9/19  Patient status:Inpatient  Patient is  from :Home  Anticipated discharge un:Ynfz  Estimated DC date:tomorrow   Consultants: Oncology  Procedures:None  Antimicrobials:  Anti-infectives (From admission, onward)    None       Subjective: Patient seen and examined at bedside today.  Hemodynamically stable.  Feels much better today.  Calcium level has improved.  Having bowel movements. Abdomen appears little distended than yesterday.  Plan for paracentesis  Objective: Vitals:   10/16/23 1337 10/16/23 1459 10/16/23 2103 10/17/23 0536  BP: 129/76 119/69 115/74 109/66  Pulse: 90 86 84 82  Resp: 18 18 16 17   Temp: 98.9 F (37.2 C) 98.4 F (36.9 C) (!) 97.4 F (36.3 C) 98.1 F (36.7 C)  TempSrc: Oral Oral Oral Oral  SpO2: 96% 94% 94% 92%  Weight:      Height:        Intake/Output Summary (Last 24 hours) at 10/17/2023 1136 Last data filed at 10/17/2023 0552 Gross per 24 hour  Intake 1379.17 ml  Output --  Net 1379.17 ml    Filed Weights   10/14/23 1633  Weight: 80.3 kg    Examination:   General exam: Overall comfortable, not in distress HEENT: PERRL Respiratory system:  no wheezes or crackles  Cardiovascular system: S1 & S2 heard, RRR.  Gastrointestinal system: Abdomen is distended, soft and nontender, ascites Central nervous system: Alert and oriented Extremities: No edema, no clubbing ,no cyanosis Skin: No rashes, no ulcers,no icterus        Data Reviewed: I have personally reviewed following labs and imaging studies  CBC: Recent Labs  Lab 10/14/23 0809 10/14/23 1216 10/15/23 0550 10/16/23 0557 10/17/23 0526  WBC 18.4* 18.7* 13.5* 25.0* 17.2*  NEUTROABS 16.3*  --   --   --   --  HGB 13.3 13.1 12.3 11.9* 10.9*  HCT 40.0 41.6 40.3 38.2 37.5  MCV 82.8 84.7 87.8 88.0 91.2  PLT 189 389 344 393 297   Basic Metabolic Panel: Recent Labs  Lab 10/14/23 1216 10/14/23 2120 10/15/23 0550 10/16/23 0557 10/17/23 0526  NA 126* 130* 133* 131* 127*  K 5.4* 4.9 4.7 5.0 4.8  CL 91* 96* 98 98  98  CO2 26 24 27 23  19*  GLUCOSE 146* 195* 160* 184* 160*  BUN 21* 24* 23* 25* 25*  CREATININE 0.83 0.84 0.70 0.76 0.57  CALCIUM 14.4* 12.8* 13.9* 13.7* 10.8*  MG 2.5*  --   --   --   --      No results found for this or any previous visit (from the past 240 hours).   Radiology Studies: No results found.   Scheduled Meds:  calcitonin (salmon)  1 spray Alternating Nares Daily   enoxaparin  (LOVENOX ) injection  40 mg Subcutaneous QHS   feeding supplement  237 mL Oral BID BM   pantoprazole   40 mg Oral QAC breakfast   polyethylene glycol  17 g Oral BID   senna-docusate  2 tablet Oral BID   Continuous Infusions:  sodium chloride  10 mL/hr at 10/16/23 1145   sodium chloride  125 mL/hr at 10/17/23 0552     LOS: 3 days   Ivonne Mustache, MD [facility] P9/19/2025, 11:36 AM

## 2023-10-17 NOTE — Procedures (Signed)
 Vascular and Interventional Radiology Procedure Note  Patient: [name] DOB: [birth year] Medical Record Number: [id number] Note Date/Time: 10/17/23 3:32 PM   Performing Physician: Thom Hall, MD Assistant(s): None  Diagnosis: Ovarian cancer. Recurrent, malignant ascites.  Procedure:  PORT PLACEMENT THERAPEUTIC PARACENTESIS  Anesthesia: Conscious Sedation Complications: None Estimated Blood Loss: Minimal  Findings:  Successful right-sided port placement, with the tip of the catheter in the proximal right atrium. Plan: Catheter ready for use.  The RIGHT upper quadrant peritoneal space was accessed with a 31F Pigtail catheter, and 3,150 mL of SS fluid was obtained. Intravenous Albumin: was not administered.   See detailed procedure note with images in PACS. The patient tolerated the procedure well without incident or complication and was returned to Floor Bed in stable condition.    Thom Hall, MD [facility]   Pager. [phone] Clinic. [phone]

## 2023-10-18 LAB — COMPREHENSIVE METABOLIC PANEL WITH GFR
ALT: 99 U/L — ABNORMAL HIGH (ref 0–44)
AST: 120 U/L — ABNORMAL HIGH (ref 15–41)
Albumin: 2.5 g/dL — ABNORMAL LOW (ref 3.5–5.0)
Alkaline Phosphatase: 386 U/L — ABNORMAL HIGH (ref 38–126)
Anion gap: 10 (ref 5–15)
BUN: 21 mg/dL — ABNORMAL HIGH (ref 6–20)
CO2: 22 mmol/L (ref 22–32)
Calcium: 8.8 mg/dL — ABNORMAL LOW (ref 8.9–10.3)
Chloride: 102 mmol/L (ref 98–111)
Creatinine, Ser: 0.54 mg/dL (ref 0.44–1.00)
GFR, Estimated: >60 mL/min (ref >60)
Glucose, Bld: 187 mg/dL — ABNORMAL HIGH (ref 70–99)
Potassium: 4.2 mmol/L (ref 3.5–5.1)
Sodium: 133 mmol/L — ABNORMAL LOW (ref 135–145)
Total Bilirubin: 0.6 mg/dL (ref 0.0–1.2)
Total Protein: 4.1 g/dL — ABNORMAL LOW (ref 6.5–8.1)

## 2023-10-18 MED ORDER — SODIUM CHLORIDE 0.9% FLUSH
10.0000 mL | Freq: Two times a day (BID) | INTRAVENOUS | Status: DC
  Administered 2023-10-18: 10 mL

## 2023-10-18 MED ORDER — CHLORHEXIDINE GLUCONATE CLOTH 2 % EX PADS: 6.0000 | MEDICATED_PAD | Freq: Every day | CUTANEOUS | Status: DC

## 2023-10-18 MED ORDER — HEPARIN SOD (PORK) LOCK FLUSH 100 UNIT/ML IV SOLN
500.0000 [IU] | Freq: Once | INTRAVENOUS | Status: AC
  Administered 2023-10-18: 500 [IU] via INTRAVENOUS
  Filled 2023-10-18: qty 5

## 2023-10-18 MED ORDER — SODIUM CHLORIDE 0.9% FLUSH: 10.0000 mL | INTRAVENOUS | Status: DC | PRN

## 2023-10-18 MED ORDER — POLYETHYLENE GLYCOL 3350 17 G PO PACK: 17.0000 g | PACK | Freq: Every day | ORAL | 0 refills | Status: AC

## 2023-10-18 NOTE — Plan of Care (Signed)

## 2023-10-18 NOTE — Plan of Care (Signed)
 Pt given discharge instructions, she verbalized her understanding. Pt vitals stable, PIV intact upon removal, port deaccessed. Pt going by private vehicle.  Problem: Education: Goal: Knowledge of General Education information will improve Description: Including pain rating scale, medication(s)/side effects and non-pharmacologic comfort measures Outcome: Progressing   Problem: Health Behavior/Discharge Planning: Goal: Ability to manage health-related needs will improve Outcome: Progressing   Problem: Clinical Measurements: Goal: Ability to maintain clinical measurements within normal limits will improve Outcome: Progressing Goal: Will remain free from infection Outcome: Progressing Goal: Diagnostic test results will improve Outcome: Progressing Goal: Respiratory complications will improve Outcome: Progressing Goal: Cardiovascular complication will be avoided Outcome: Progressing   Problem: Activity: Goal: Risk for activity intolerance will decrease Outcome: Progressing   Problem: Nutrition: Goal: Adequate nutrition will be maintained Outcome: Progressing   Problem: Coping: Goal: Level of anxiety will decrease Outcome: Progressing   Problem: Elimination: Goal: Will not experience complications related to bowel motility Outcome: Progressing Goal: Will not experience complications related to urinary retention Outcome: Progressing   Problem: Pain Managment: Goal: General experience of comfort will improve and/or be controlled Outcome: Progressing   Problem: Safety: Goal: Ability to remain free from injury will improve Outcome: Progressing   Problem: Skin Integrity: Goal: Risk for impaired skin integrity will decrease Outcome: Progressing

## 2023-10-18 NOTE — Evaluation (Signed)
 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  Given calcitonin, Zometa , IV fluid for hypercalcemia.  Given chemotherapy on 9/18.  Calcium level has significantly improved.  Also underwent Port-A-Cath placement and repeat paracentesis on 9/19.  Clinical Impression  Patient evaluated by Physical Therapy with no further acute PT needs identified. All education has been completed and the patient has no further questions.  Pt and spouse very pleasant and eager to d/c home today.  Pt reports she has been up to bathroom numerous times lately.  Pt does reports more generalized weakness and fatigue today (started chemo tx a couple days ago).  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  Discussed HHPT vs. OPPT to assist with cancer rehab as needed.  Pt and spouse feel starting with HHPT would be beneficial.  Since pt set to discharge today, encouraged seeking referral from f/u MD if needed.  Spouse reports pt has all DME needs at this time. PT is signing off. Thank you for this referral.         If plan is discharge home, recommend the following:     Can travel by private vehicle        Equipment Recommendations None recommended by PT  Recommendations for Other Services       Functional Status Assessment Patient has had a recent decline in their functional status and demonstrates the ability to make significant improvements in function in a reasonable and predictable amount of time.     Precautions / Restrictions Precautions Precautions: Other  (comment) Precaution/Restrictions Comments: chemo precaution      Mobility  Bed Mobility Overal bed mobility: Needs Assistance Bed Mobility: Supine to Sit, Sit to Supine     Supine to sit: Supervision, HOB elevated Sit to supine: Supervision, HOB elevated   General bed mobility comments: increased time and effort    Transfers Overall transfer level: Needs assistance Equipment used: None Transfers: Sit to/from Stand Sit to Stand: Supervision                Ambulation/Gait Ambulation/Gait assistance: Supervision Gait Distance (Feet): 100 Feet Assistive device: IV Pole Gait Pattern/deviations: Step-through pattern, Decreased stride length Gait velocity: decr     General Gait Details: slow but steady gait, pt denies falls, reports fatigue and generalized weakness from chemo tx  Stairs            Wheelchair Mobility     Tilt Bed    Modified Rankin (Stroke Patients Only)       Balance Overall balance assessment: No apparent balance deficits (not formally assessed)                                           Pertinent Vitals/Pain Pain Assessment Pain Assessment: Faces Faces Pain Scale: Hurts little more Pain Location: abdomen Pain Descriptors / Indicators: Sore, Tender Pain Intervention(s): Repositioned, Monitored during session    Home Living Family/patient expects to be discharged to:: Private residence Living Arrangements: Spouse/significant other   Type of Home: House  Home Access: Stairs to enter   Entrance Stairs-Number of Steps: 2   Home Layout: Able to live on main level with bedroom/bathroom Home Equipment: Rolling Walker (2 wheels);Wheelchair - manual Additional Comments: spouse reports being handy and plans to build pt a ramp to enter home    Prior Function Prior Level of Function : Independent/Modified Independent;Working/employed                     Extremity/Trunk Assessment   Upper Extremity  Assessment Upper Extremity Assessment: Overall WFL for tasks assessed    Lower Extremity Assessment Lower Extremity Assessment: Overall WFL for tasks assessed    Cervical / Trunk Assessment Cervical / Trunk Assessment: Normal  Communication   Communication Communication: No apparent difficulties    Cognition Arousal: Alert Behavior During Therapy: WFL for tasks assessed/performed   PT - Cognitive impairments: No apparent impairments                         Following commands: Intact       Cueing       General Comments      Exercises     Assessment/Plan    PT Assessment All further PT needs can be met in the next venue of care  PT Problem List Decreased activity tolerance;Decreased strength       PT Treatment Interventions      PT Goals (Current goals can be found in the Care Plan section)  Acute Rehab PT Goals PT Goal Formulation: All assessment and education complete, DC therapy    Frequency       Co-evaluation               AM-PAC PT 6 Clicks Mobility  Outcome Measure Help needed turning from your back to your side while in a flat bed without using bedrails?: A Little Help needed moving from lying on your back to sitting on the side of a flat bed without using bedrails?: A Little Help needed moving to and from a bed to a chair (including a wheelchair)?: A Little Help needed standing up from a chair using your arms (e.g., wheelchair or bedside chair)?: A Little Help needed to walk in hospital room?: A Little Help needed climbing 3-5 steps with a railing? : A Little 6 Click Score: 18    End of Session   Activity Tolerance: Patient tolerated treatment well Patient left: in bed;with call bell/phone within reach;with family/visitor present Nurse Communication: Mobility status PT Visit Diagnosis: Difficulty in walking, not elsewhere classified (R26.2);Muscle weakness (generalized) (M62.81)    Time: 8958-8892 PT Time Calculation  (min) (ACUTE ONLY): 26 min   Charges:   PT Evaluation $PT Eval Low Complexity: 1 Low   PT General Charges $$ ACUTE PT VISIT: 1 Visit       Tari PT, DPT Physical Therapist Acute Rehabilitation Services Office: [phone]   Tari CROME [place] 10/18/2023, 12:45 PM

## 2023-10-18 NOTE — Discharge Summary (Signed)
 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  She follows with Dr. Lonn.  Outpatient workup showed hypercalcemia.  No report of confusion.  Report of constipation.  Admitted for the management of hypercalcemia of malignancy.  Given calcitonin, Zometa , IV fluid for hypercalcemia.  Given chemotherapy on 9/18.  Calcium level has significantly improved.  Also underwent Port-A-Cath placement and repeat paracentesis on 9/19.  Very eager to go home today.  Medically stable for discharge.  She will follow-up with her oncologist as an outpatient  Following problems were addressed during the hospitalization:  Hypercalcemia of malignancy: History of ovarian neoplasm, peritoneal carcinomatosis.  Presented with calcium in the range of 14.Given  Zometa  on 9/17. Her calcium is in the range of 8 today.    Metastatic ovarian cancer: High-grade carcinoma on pathology, elevated CA125.  Oncology following.  Concern for peritoneal carcinomatosis.  Oncology gave chemotherapy on 9/18.  S/P  Port-A-Cath placement    Elevated liver enzymes: Likely secondary to metastatic ovarian cancer.  Continue to monitor as outpatient   Malignant ascites: Had paracentesis on 9/15 with removal of 4.4 L of ascitic fluid.  Underwent another paracentesis on 9/20   Constipation:  Having bowel movements,continue miralax    Leukocytosis: Most likely reactive.  Improving.  Low suspicion for infectious process   Obesity: BMI of 32.3    Discharge Diagnoses:   Principal Problem:   Hypercalcemia of malignancy Active Problems:   Hypercalcemia   Other constipation    Discharge Instructions  Discharge Instructions     Diet general   Complete by: As directed    Discharge instructions   Complete by: As directed    1)Please take your medications as instructed 2)Follow up with  your oncologist or PCP next week to do CBC and CMP tests   Increase activity slowly   Complete by: As directed    No wound care   Complete by: As directed       Allergies as of 10/18/2023       Reactions   Tree Extract Anaphylaxis, Hives, Swelling, Other (See Comments)   TREE NUTS, Nutmeg, pistachios, macadamias (patient somewhat disagrees with this in 09/2023)   Codeine Nausea And Vomiting   Latex Other (See Comments)   Latex Band-Aids = Caused skin irritation        Medication List     STOP taking these medications    dexamethasone  4 MG tablet Commonly known as: DECADRON        TAKE these medications    lidocaine -prilocaine  cream Commonly known as: EMLA  Apply to affected area once   omeprazole 20 MG tablet Commonly known as: PRILOSEC OTC Take 20 mg by mouth daily before breakfast.   ondansetron  8 MG tablet Commonly known as: ZOFRAN  Take 1 tablet (8 mg total) by mouth every 8 (eight) hours as needed for nausea or vomiting.   polyethylene glycol 17 g packet Commonly known as: MIRALAX  / GLYCOLAX  Take 17 g by mouth daily.   prochlorperazine  10 MG tablet Commonly known as: COMPAZINE  Take 1 tablet (10 mg total) by mouth every 6 (six) hours as needed for nausea  or vomiting.        Follow-up Information     Miranda Prentice SAUNDERS, FNP. Schedule an appointment as soon as possible for a visit in 1 week(s).   Specialty: Family Medicine Contact information: [address] [place] [address] [phone]         Miranda Hicks, MD. Schedule an appointment as soon as possible for a visit in 1 week(s).   Specialty: Hematology and  Oncology Contact information: [address] [place] [address] [phone]                Allergies  Allergen Reactions   Tree Extract Anaphylaxis, Hives, Swelling and Other (See Comments)    TREE NUTS, Nutmeg, pistachios, macadamias (patient somewhat disagrees with this in 09/2023)   Codeine Nausea And Vomiting   Latex Other (See Comments)    Latex Band-Aids = Caused skin irritation    Consultations: Oncology   Procedures/Studies: IR IMAGING GUIDED PORT INSERTION Result Date: 10/17/2023 INDICATION: History of metastatic ovarian cancer. EXAM: IMPLANTED PORT A CATH PLACEMENT WITH ULTRASOUND AND FLUOROSCOPIC GUIDANCE MEDICATIONS: Ancef  2 gm IV The antibiotic was administered within an appropriate time interval prior to skin puncture. ANESTHESIA/SEDATION: Moderate (conscious) sedation was employed during this procedure. A total of Versed  2 mg and Fentanyl  100 mcg was administered intravenously. Moderate Sedation Time: 30 minutes. The patient's level of consciousness and vital signs were monitored continuously by radiology nursing throughout the procedure under my direct supervision. FLUOROSCOPY: Radiation Exposure Index and estimated peak skin dose (PSD); Reference air kerma (RAK), 1 mGy. COMPLICATIONS: None immediate. PROCEDURE: The procedure, risks, benefits, and alternatives were explained to the patient. Questions regarding the procedure were encouraged and answered. The patient understands and consents to the procedure. The RIGHT neck and chest were prepped with chlorhexidine  in a sterile fashion, and a sterile drape was applied covering the operative field. Maximum barrier sterile technique with sterile gowns and gloves were used for the procedure. A timeout was performed prior to the initiation of the procedure. Local anesthesia was provided with 1% lidocaine  with epinephrine . After creating a small venotomy incision, a micropuncture kit was utilized to access the  internal jugular vein under direct, real-time ultrasound guidance. Ultrasound image documentation was performed. The microwire was kinked to measure appropriate catheter length. A subcutaneous port pocket was then created along the upper chest wall utilizing a combination of sharp and blunt dissection. The pocket was irrigated with sterile saline. A single lumen power injectable port was chosen for placement. The 8 Fr catheter was tunneled from the port pocket site to the venotomy incision. The port was placed in the pocket. The external catheter was trimmed to appropriate length. At the venotomy, an 8 Fr peel-away sheath was placed over a guidewire under fluoroscopic guidance. The catheter was then placed through the sheath and the sheath was removed. Final catheter positioning was confirmed and documented with a fluoroscopic spot radiograph. The port was accessed with a Huber needle, aspirated and flushed with heparinized saline. The port pocket incision was closed with interrupted 3-0 Vicryl suture then Dermabond was applied, including at the venotomy incision. Dressings were placed. The patient tolerated the procedure well without immediate post procedural complication. IMPRESSION: Successful placement of a RIGHT internal jugular approach power injectable Port-A-Cath. The tip of the catheter is positioned within the proximal RIGHT atrium. The catheter was accessed and is ready for immediate use. Thom Hall, MD [facility] Electronically Signed   By: Thom  Mugweru M.D.   On: 10/17/2023 16:35   IR Paracentesis Result Date: 10/17/2023 INDICATION: Recurrent ascites. History of metastatic ovarian cancer with malignant ascites. EXAM: ULTRASOUND-GUIDED THERAPEUTIC PARACENTESIS COMPARISON:  IR ULTRASOUND, 10/13/2023.  CT AP, 09/22/2023. MEDICATIONS: None. COMPLICATIONS: None immediate. TECHNIQUE: Informed written consent was obtained from the patient after a  discussion of the risks, benefits and alternatives to treatment. A timeout was performed prior to the initiation of the procedure. Initial ultrasound scanning demonstrates a moderate volume of ascites within the right lower abdominal quadrant. The RIGHT upper abdomen was prepped and draped in the usual sterile fashion. 1% lidocaine  with epinephrine  was used for local anesthesia. An ultrasound image was saved for documentation purposed. A 6 Fr Safe-T-Centesis catheter was introduced. The paracentesis was performed. The catheter was removed and a dressing was applied. The patient tolerated the procedure well without immediate post procedural complication. FINDINGS: A total of approximately 3.15 L of serosanguineous ascitic fluid was removed. IMPRESSION: Successful ultrasound-guided therapeutic paracentesis yielding 3.15 L of peritoneal fluid. Thom Hall, MD [facility] Electronically Signed   By: Thom Hall M.D.   On: 10/17/2023 16:33   US  Paracentesis Result Date: 10/13/2023 INDICATION: recurrent ascites Patient with 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 Request received for therapeutic paracentesis. EXAM: ULTRASOUND GUIDED THERAPEUTIC PARACENTESIS MEDICATIONS: 8 mL 1% lidocaine  with epinephrine  to skin and subcutaneous tissue COMPLICATIONS: None immediate. PROCEDURE: Informed written consent was obtained from the patient after a discussion of the risks, benefits and alternatives to treatment. A timeout was performed prior to the initiation of the procedure. Initial ultrasound scanning demonstrates a moderate amount of ascites within the left lower abdominal quadrant. The left lower abdomen was prepped and draped in the usual sterile fashion. 1% lidocaine  was used for local anesthesia. Following this, a 6 Fr Safe-T-Centesis catheter was introduced. An ultrasound image was saved for documentation purposes. The  paracentesis was performed. The catheter was removed and a dressing was applied. The patient tolerated the procedure well without immediate post procedural complication. FINDINGS: A total of approximately 4.4 L of amber fluid was removed. IMPRESSION: Successful ultrasound-guided therapeutic paracentesis yielding 4.4 L liters of peritoneal fluid. Performed by: Franky Rakers, PA-C under direct supervision of Thom Hall, MD Electronically Signed   By: Thom Hall M.D.   On: 10/13/2023 16:47   CT ABDOMINAL MASS BIOPSY Result Date: 10/08/2023 INDICATION: PERITONEAL CARCINOMATOSIS, ASCITES EXAM: CT CORE BIOPSY OF THE LEFT LOWER QUADRANT PERITONEAL CARCINOMATOSIS MEDICATIONS: 1% lidocaine  local ANESTHESIA/SEDATION: Moderate (conscious) sedation was employed during this procedure. A total of Versed  2.0 mg and Fentanyl  100 mcg was administered intravenously by the radiology nurse. Total intra-service moderate Sedation Time: 10 minutes. The patient's level of consciousness and vital signs were monitored continuously by radiology nursing throughout the procedure under my direct supervision. COMPLICATIONS: None immediate. PROCEDURE: Informed written consent was obtained from the patient after a thorough discussion of the procedural risks, benefits and alternatives. All questions were addressed. Maximal Sterile Barrier Technique was utilized including caps, mask, sterile gowns, sterile gloves, sterile drape, hand hygiene and skin antiseptic. A timeout was performed prior to the initiation of the procedure. Previous imaging reviewed. Patient positioned supine. Noncontrast localization CT performed. The peritoneal carcinomatosis was localized for biopsy in the left lower quadrant. Under sterile conditions and local anesthesia, the 17 gauge guide needle was advanced to the peritoneal carcinomatosis. Needle position confirmed with CT. 18 gauge core biopsies obtained. Samples placed in formalin. Samples were intact and  non  fragmented. Needle removed. Postprocedure imaging demonstrates no hemorrhage or hematoma. Patient tolerated biopsy well. IMPRESSION: Successful CT-guided left lower quadrant peritoneal carcinomatosis 18 gauge core biopsy Electronically Signed   By: CHRISTELLA.  Shick M.D.   On: 10/08/2023 14:09   US  Paracentesis Result Date: 10/08/2023 INDICATION: Patient with history of carcinomatosis peritonei, recurrent malignant ascites. Request received for diagnostic and therapeutic paracentesis. EXAM: ULTRASOUND GUIDED DIAGNOSTIC AND THERAPEUTIC PARACENTESIS MEDICATIONS: 8 mL 1% lidocaine  with epinephrine  to skin/subcutaneous tissue COMPLICATIONS: None immediate. PROCEDURE: Informed written consent was obtained from the patient after a discussion of the risks, benefits and alternatives to treatment. A timeout was performed prior to the initiation of the procedure. Initial ultrasound scanning demonstrates a moderate amount of ascites within the right upper abdominal quadrant. The right upper abdomen was prepped and draped in the usual sterile fashion. 1% lidocaine  was used for local anesthesia. Following this, a 19 gauge, 10-cm, Yueh catheter was introduced. An ultrasound image was saved for documentation purposes. The paracentesis was performed. The catheter was removed and a dressing was applied. The patient tolerated the procedure well without immediate post procedural complication. FINDINGS: A total of approximately 3.1 liters of amber fluid was removed. Samples were sent to the laboratory as requested by the clinical team. IMPRESSION: Successful ultrasound-guided diagnostic and therapeutic paracentesis yielding 3.1 liters of peritoneal fluid. Performed by: Franky Rakers, PA-C Electronically Signed   By: CHRISTELLA.  Shick M.D.   On: 10/08/2023 12:00   IR Paracentesis Result Date: 10/06/2023 INDICATION: 58 year old female with 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 Request for therapeutic  paracentesis. EXAM: ULTRASOUND GUIDED THERAPEUTIC PARACENTESIS MEDICATIONS: 10 mL 1% lidocaine  COMPLICATIONS: None immediate. PROCEDURE: Informed written consent was obtained from the patient after a discussion of the risks, benefits and alternatives to treatment. A timeout was performed prior to the initiation of the procedure. Initial ultrasound scanning demonstrates a small amount of ascites within the right lower abdominal quadrant. The right lower abdomen was prepped and draped in the usual sterile fashion. 1% lidocaine  was used for local anesthesia. Following this, a 19 gauge, 7-cm, Centesis catheter was introduced. An ultrasound image was saved for documentation purposes. The paracentesis was performed. The catheter was removed and a dressing was applied. The patient tolerated the procedure well without immediate post procedural complication. Patient received post-procedure intravenous albumin; see nursing notes for details. FINDINGS: A total of approximately 3.4 liters of blood-tinged fluid was removed. Samples were sent to the laboratory as requested by the clinical team. IMPRESSION: Successful ultrasound-guided paracentesis yielding 3.4 liters of peritoneal fluid. Performed by: Kacie Matthews PA-C Electronically Signed   By: CHRISTELLA.  Shick M.D.   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 Request for therapeutic paracentesis. EXAM: ULTRASOUND GUIDED THERAPEUTIC PARACENTESIS MEDICATIONS: 6 mL 1% lidocaine  COMPLICATIONS: None immediate. PROCEDURE: Informed written consent was obtained from the patient after a discussion of the risks, benefits and alternatives to treatment. A timeout was performed prior to the initiation of the procedure. Initial ultrasound scanning demonstrates a large amount of ascites within the right lower abdominal quadrant. The right lower abdomen was  prepped and draped in the usual sterile fashion. 1% lidocaine  was used for local anesthesia. Following this, a 19 gauge, 7-cm, Yueh catheter was introduced. An ultrasound image was saved for documentation purposes. The paracentesis was performed. The catheter was removed and a dressing was applied. The  patient tolerated the procedure well without immediate post procedural complication. FINDINGS: A total of approximately 3.2 L of clear, amber fluid was removed. IMPRESSION: Successful ultrasound-guided paracentesis yielding 3.2 liters of peritoneal fluid. Performed by Clotilda Hesselbach, PA-C Electronically Signed   By: Ester Sides M.D.   On: 10/01/2023 12:46   NM PET Image Initial (PI) Skull Base To Thigh (F-18 FDG) Result Date: 09/25/2023 CLINICAL DATA:  Initial treatment strategy for omental and peritoneal nodularity compatible with carcinomatosis. EXAM: NUCLEAR MEDICINE PET SKULL BASE TO THIGH TECHNIQUE: 9.4 mCi F-18 FDG was injected intravenously. Full-ring PET imaging was performed from the skull base to thigh after the radiotracer. CT data was obtained and used for attenuation correction and anatomic localization. Fasting blood glucose: 116 mg/dl COMPARISON:  CT scan 1/74/7974 FINDINGS: Mediastinal blood pool activity: SUV max 2.4 Liver activity: SUV max NA NECK: No significant abnormal hypermetabolic activity in this region. Incidental CT findings: None. CHEST: Small type 1 hiatal hernia, accompanied by a small amount of ascites, faint metabolic activity in the ascites with maximum SUV 2.3, cannot exclude malignant ascites extension up through the hiatus. Incidental CT findings: Mild scarring or subsegmental atelectasis anteriorly in the right upper lobe. ABDOMEN/PELVIS: Heavy burden of hypermetabolic omental caking with malignant ascites and numerous foci of hypermetabolic activity along the liver capsule, right paracolic gutter, left paracolic gutter, mesentery, and pelvic ascites. A representative  region of the omental caking in the left lower quadrant anterior to the descending colon a maximum SUV of 14.6. A hypermetabolic tumor deposit in the pelvic ascites anterior to the rectum on image 171 series 4 has maximum SUV of 16.0 and measures 2.7 cm in long axis. Hypermetabolic focus along the posterosuperior liver margin maximum SUV 7.8. Isolation of primary site is problematic given the tumor deposits along the cecum and expected location of the appendix as well as the adnexa. Overall moderate amount of malignant ascites. Incidental CT findings: None. SKELETON: No significant abnormal hypermetabolic activity in this region. Incidental CT findings: None. IMPRESSION: 1. Heavy burden of hypermetabolic omental caking with malignant ascites and numerous foci of hypermetabolic activity along the liver capsule, right paracolic gutter, left paracolic gutter, mesentery, and pelvic ascites. Isolation of primary site is problematic given the tumor deposits along the cecum and expected location of the appendix as well as the adnexa. Overall moderate amount of malignant ascites. 2. Small type 1 hiatal hernia, accompanied by a small amount of ascites, faint metabolic activity in the ascites with maximum SUV 2.3, cannot exclude malignant ascites extension up through the hiatus. Electronically Signed   By: Ryan Salvage M.D.   On: 09/25/2023 16:59   US  Paracentesis Result Date: 09/24/2023 INDICATION: other ascites Patient with history of bloating, abdominal distension, imaging findings of extensive omental and peritoneal nodularity, ascites; request received for diagnostic and therapeutic paracentesis. EXAM: ULTRASOUND GUIDED DIAGNOSTIC AND THERAPEUTIC PARACENTESIS MEDICATIONS: 8 mL 1% lidocaine  with epinephrine  COMPLICATIONS: None immediate. PROCEDURE: Informed written consent was obtained from the patient after a discussion of the risks, benefits and alternatives to treatment. A timeout was performed prior to the  initiation of the procedure. Initial ultrasound scanning demonstrates a large amount of ascites within the LEFT lower abdominal quadrant. The left lower abdomen was prepped and draped in the usual sterile fashion. 1% lidocaine  was used for local anesthesia. Following this, a 6 Fr Safe-T-Centesis catheter was introduced. An ultrasound image was saved for documentation purposes. The paracentesis was performed. The catheter was removed and a dressing was applied. The  patient tolerated the procedure well without immediate post procedural complication. FINDINGS: A total of approximately 4.7 L of hazy, blood-tinged fluid was removed. Samples were sent to the laboratory as requested by the clinical team. IMPRESSION: Successful ultrasound-guided diagnostic and therapeutic paracentesis yielding 4.7 L of peritoneal fluid. Performed by: Franky Rakers, PA-C Electronically Signed   By: Thom Hall M.D.   On: 09/24/2023 17:10   CT ABDOMEN PELVIS W CONTRAST Result Date: 09/22/2023 CLINICAL DATA:  Bloating.  Distended abdomen. * Tracking Code: BO * EXAM: CT ABDOMEN AND PELVIS WITH CONTRAST TECHNIQUE: Multidetector CT imaging of the abdomen and pelvis was performed using the standard protocol following bolus administration of intravenous contrast. RADIATION DOSE REDUCTION: This exam was performed according to the departmental dose-optimization program which includes automated exposure control, adjustment of the mA and/or kV according to patient size and/or use of iterative reconstruction technique. CONTRAST:  100mL ISOVUE -300 IOPAMIDOL  (ISOVUE -300) INJECTION 61% COMPARISON:  None Available. FINDINGS: Lower chest: Small focus of atelectasis in the inferior lingula. No pulmonary nodules. Hepatobiliary: No focal hepatic lesion. Low-density gallstone noted. No sickle size. Pancreas: Pancreas is normal. No ductal dilatation. No pancreatic inflammation. Spleen: Normal spleen Adrenals/urinary tract: Adrenal glands and kidneys are  normal. The ureters and bladder normal. Stomach/Bowel: Stomach is normal. The small bowel is floating on moderate volume intraperitoneal free fluid within the small bowel mesentery. No bowel obstruction. Terminal ileum is normal. Appendix not clearly identified. The colon is normal. Contrast travels the entirety of the colon to the rectum. Vascular/Lymphatic: Abdominal aorta is normal caliber. No periportal or retroperitoneal adenopathy. No pelvic adenopathy. Reproductive: Uterus and ovaries are grossly normal. Other: Moderate volume intraperitoneal free fluid. There is extensive nodularity the greater omentum (omental caking, image 58/2 for example). There are peritoneal nodules noted. For example nodule adjacent the RIGHT hepatic lobe measuring 13 mm image 35/2. Peritoneal nodularity in the posterior cul-de-sac on image 84/2. Musculoskeletal: No aggressive osseous lesion. IMPRESSION: 1. 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 2. Moderate volume intraperitoneal free fluid. 3. No clear ovarian or appendiceal neoplasm. No clear primary malignancy identified. These results will be called to the ordering clinician or representative by the Radiologist Assistant, and communication documented in the PACS or [employer]. Electronically Signed   By: Jackquline Boxer M.D.   On: 09/22/2023 10:36      Subjective: Patient seen and examined at bedside today.  Hemodynamically stable.  Comfortable.  Having bowel movements.  Calcium level improved.  Very eager to go home today ,medically stable for discharge   Discharge Exam: Vitals:   10/17/23 2044 10/18/23 0631  BP: 114/67 124/63  Pulse: 91 95  Resp: 17 18  Temp:  97.7 F (36.5 C)  SpO2: 99% 96%   Vitals:   10/17/23 1615 10/17/23 2030 10/17/23 2044 10/18/23 0631  BP: 110/65 (!) 82/52 114/67 124/63  Pulse: 96 94 91 95  Resp: 20 17 17 18   Temp:  98.5 F (36.9 C)  97.7 F (36.5 C)  TempSrc:  Oral  Oral  SpO2: 95%   99% 96%  Weight:      Height:        General: Pt is alert, awake, not in acute distress Cardiovascular: RRR, S1/S2 +, no rubs, no gallops Respiratory: CTA bilaterally, no wheezing, no rhonchi Abdominal: Soft, NT, ND, bowel sounds + Extremities: no edema, no cyanosis    The results of significant diagnostics from this hospitalization (including imaging, microbiology, ancillary and laboratory) are listed below for reference.  Microbiology: No results found for this or any previous visit (from the past 240 hours).   Labs: BNP (last 3 results) No results for input(s): BNP in the last 8760 hours. Basic Metabolic Panel: Recent Labs  Lab 10/14/23 1216 10/14/23 2120 10/15/23 0550 10/16/23 0557 10/17/23 0526 10/18/23 0643  NA 126* 130* 133* 131* 127* 133*  K 5.4* 4.9 4.7 5.0 4.8 4.2  CL 91* 96* 98 98 98 102  CO2 26 24 27 23  19* 22  GLUCOSE 146* 195* 160* 184* 160* 187*  BUN 21* 24* 23* 25* 25* 21*  CREATININE 0.83 0.84 0.70 0.76 0.57 0.54  CALCIUM 14.4* 12.8* 13.9* 13.7* 10.8* 8.8*  MG 2.5*  --   --   --   --   --    Liver Function Tests: Recent Labs  Lab 10/14/23 0809 10/15/23 0550 10/16/23 0557 10/17/23 0526 10/18/23 0643  AST 32 70* 126* 100* 120*  ALT 19 47* 107* 114* 99*  ALKPHOS 135* 160* 278* 391* 386*  BILITOT 1.1 1.1 1.1 1.1 0.6  PROT 5.4* 5.1* 5.2* 4.6* 4.1*  ALBUMIN 2.7* 2.7* 2.8* 2.6* 2.5*   No results for input(s): LIPASE, AMYLASE in the last 168 hours. No results for input(s): AMMONIA in the last 168 hours. CBC: Recent Labs  Lab 10/14/23 0809 10/14/23 1216 10/15/23 0550 10/16/23 0557 10/17/23 0526  WBC 18.4* 18.7* 13.5* 25.0* 17.2*  NEUTROABS 16.3*  --   --   --   --   HGB 13.3 13.1 12.3 11.9* 10.9*  HCT 40.0 41.6 40.3 38.2 37.5  MCV 82.8 84.7 87.8 88.0 91.2  PLT 189 389 344 393 297   Cardiac Enzymes: No results for input(s): CKTOTAL, CKMB, CKMBINDEX, TROPONINI in the last 168 hours. BNP: Invalid input(s):  POCBNP CBG: No results for input(s): GLUCAP in the last 168 hours. D-Dimer No results for input(s): DDIMER in the last 72 hours. Hgb A1c No results for input(s): HGBA1C in the last 72 hours. Lipid Profile No results for input(s): CHOL, HDL, LDLCALC, TRIG, CHOLHDL, LDLDIRECT in the last 72 hours. Thyroid  function studies No results for input(s): TSH, T4TOTAL, T3FREE, THYROIDAB in the last 72 hours.  Invalid input(s): FREET3 Anemia work up No results for input(s): VITAMINB12, FOLATE, FERRITIN, TIBC, IRON, RETICCTPCT in the last 72 hours. Urinalysis    Component Value Date/Time   COLORURINE YELLOW 10/14/2023 1351   APPEARANCEUR CLEAR 10/14/2023 1351   LABSPEC 1.017 10/14/2023 1351   PHURINE 5.0 10/14/2023 1351   GLUCOSEU NEGATIVE 10/14/2023 1351   HGBUR NEGATIVE 10/14/2023 1351   BILIRUBINUR NEGATIVE 10/14/2023 1351   KETONESUR NEGATIVE 10/14/2023 1351   PROTEINUR NEGATIVE 10/14/2023 1351   UROBILINOGEN 0.2 10/05/2013 1443   NITRITE NEGATIVE 10/14/2023 1351   LEUKOCYTESUR NEGATIVE 10/14/2023 1351   Sepsis Labs Recent Labs  Lab 10/14/23 1216 10/15/23 0550 10/16/23 0557 10/17/23 0526  WBC 18.7* 13.5* 25.0* 17.2*   Microbiology No results found for this or any previous visit (from the past 240 hours).  Please note: You were cared for by a hospitalist during your hospital stay. Once you are discharged, your primary care physician will handle any further medical issues. Please note that NO REFILLS for any discharge medications will be authorized once you are discharged, as it is imperative that you return to your primary care physician (or establish a relationship with a primary care physician if you do not have one) for your post hospital discharge needs so that they can reassess your need for medications and monitor your  lab values.    Time coordinating discharge: 40 minutes  SIGNED:   Ivonne Mustache, MD  [facility] 10/18/2023, 10:38 AM Pager [phone]  If 7PM-7AM, please contact night-coverage [email or web address] Password [email or web address]

## 2023-10-20 ENCOUNTER — Other Ambulatory Visit: Admitting: Licensed Clinical Social Worker

## 2023-10-20 ENCOUNTER — Telehealth: Payer: Self-pay

## 2023-10-20 ENCOUNTER — Ambulatory Visit

## 2023-10-20 NOTE — Telephone Encounter (Signed)
 Pt's husband wants to know if it is OK for his wife to have a massage.   Please advise

## 2023-10-21 ENCOUNTER — Telehealth: Payer: Self-pay | Admitting: *Deleted

## 2023-10-21 ENCOUNTER — Other Ambulatory Visit: Payer: Self-pay | Admitting: Hematology and Oncology

## 2023-10-21 DIAGNOSIS — R180 Malignant ascites: Secondary | ICD-10-CM

## 2023-10-21 DIAGNOSIS — C569 Malignant neoplasm of unspecified ovary: Secondary | ICD-10-CM

## 2023-10-21 NOTE — Telephone Encounter (Signed)
 [place], ok for her to get massage

## 2023-10-21 NOTE — Telephone Encounter (Signed)
 Called pt and husband to make aware that IR paracentesis is scheduled for 9/26 at 1030 am. Pt and husband verbalized understanding

## 2023-10-21 NOTE — Telephone Encounter (Signed)
 Pls schedule her paracentesis for Thursday, order is placed

## 2023-10-21 NOTE — Telephone Encounter (Signed)
 Per Dr. Lonn, called pt to make aware the provider would like an urgent appt on 9/25. Pt agreed and advised that labs will be drawn before appt. Advised to arrive by 115 pm. Pt verbalized understanding.

## 2023-10-21 NOTE — Telephone Encounter (Signed)
 The patient and her husband called regarding urinary symptoms. Patient crying in the background. They want to know if there is anything to do to help with the pain. Spoke with Eleanor Epps, APP and since our office has not seen her yet has a patient there is nothing we can do, but  message forwarded to Dr Lonn and her desk RN.

## 2023-10-21 NOTE — Telephone Encounter (Signed)
 Spoke with the patient regarding the referral to GYN oncology. Patient scheduled as new patient with Dr Viktoria on 10/16 at 9 am. Patient given an arrival time of 8:30 am.  Explained to the patient the the doctor will perform a pelvic exam at this visit. Patient given the policy that only one visitor allowed and that visitor must be over 16 yrs are allowed in the Cancer Center. Patient given the address/phone number for the clinic and that the center offers free valet service. Patient aware that masks optional.

## 2023-10-21 NOTE — Telephone Encounter (Signed)
 Miranda Hill did she cry this morning when you set up her appt? Can you call back to find out what is going on?

## 2023-10-22 ENCOUNTER — Encounter: Payer: Self-pay | Admitting: Hematology and Oncology

## 2023-10-23 ENCOUNTER — Inpatient Hospital Stay: Admitting: Hematology and Oncology

## 2023-10-23 ENCOUNTER — Ambulatory Visit (HOSPITAL_COMMUNITY)
Admission: RE | Admit: 2023-10-23 | Discharge: 2023-10-23 | Disposition: A | Discharge status: Home / Self Care | Source: Ambulatory Visit | Attending: Hematology and Oncology | Admitting: Hematology and Oncology

## 2023-10-23 ENCOUNTER — Encounter: Payer: Self-pay | Admitting: Hematology and Oncology

## 2023-10-23 ENCOUNTER — Inpatient Hospital Stay

## 2023-10-23 VITALS — BP 118/66 | HR 100 | Temp 98.8°F | Resp 18 | Ht 62.0 in | Wt 176.2 lb

## 2023-10-23 DIAGNOSIS — Z8049 Family history of malignant neoplasm of other genital organs: Secondary | ICD-10-CM | POA: Diagnosis not present

## 2023-10-23 DIAGNOSIS — R000 Tachycardia, unspecified: Secondary | ICD-10-CM | POA: Diagnosis not present

## 2023-10-23 DIAGNOSIS — R180 Malignant ascites: Secondary | ICD-10-CM

## 2023-10-23 DIAGNOSIS — D61818 Other pancytopenia: Secondary | ICD-10-CM

## 2023-10-23 DIAGNOSIS — Z801 Family history of malignant neoplasm of trachea, bronchus and lung: Secondary | ICD-10-CM | POA: Diagnosis not present

## 2023-10-23 DIAGNOSIS — E871 Hypo-osmolality and hyponatremia: Secondary | ICD-10-CM | POA: Diagnosis not present

## 2023-10-23 DIAGNOSIS — C569 Malignant neoplasm of unspecified ovary: Secondary | ICD-10-CM | POA: Diagnosis not present

## 2023-10-23 DIAGNOSIS — R600 Localized edema: Secondary | ICD-10-CM | POA: Insufficient documentation

## 2023-10-23 DIAGNOSIS — C786 Secondary malignant neoplasm of retroperitoneum and peritoneum: Secondary | ICD-10-CM

## 2023-10-23 DIAGNOSIS — Z808 Family history of malignant neoplasm of other organs or systems: Secondary | ICD-10-CM | POA: Diagnosis not present

## 2023-10-23 DIAGNOSIS — R971 Elevated cancer antigen 125 [CA 125]: Secondary | ICD-10-CM | POA: Diagnosis not present

## 2023-10-23 DIAGNOSIS — Z800 Family history of malignant neoplasm of digestive organs: Secondary | ICD-10-CM | POA: Diagnosis not present

## 2023-10-23 DIAGNOSIS — E860 Dehydration: Secondary | ICD-10-CM | POA: Diagnosis not present

## 2023-10-23 DIAGNOSIS — R110 Nausea: Secondary | ICD-10-CM | POA: Diagnosis not present

## 2023-10-23 HISTORY — PX: IR PARACENTESIS: IMG2679

## 2023-10-23 LAB — CBC WITH DIFFERENTIAL (CANCER CENTER ONLY)
Abs Immature Granulocytes: 0.02 K/uL (ref 0.00–0.07)
Basophils Absolute: 0 K/uL (ref 0.0–0.1)
Basophils Relative: 0 %
Eosinophils Absolute: 0 K/uL (ref 0.0–0.5)
Eosinophils Relative: 1 %
HCT: 29.4 % — ABNORMAL LOW (ref 36.0–46.0)
Hemoglobin: 9.6 g/dL — ABNORMAL LOW (ref 12.0–15.0)
Immature Granulocytes: 1 %
Lymphocytes Relative: 20 %
Lymphs Abs: 0.8 K/uL (ref 0.7–4.0)
MCH: 27.2 pg (ref 26.0–34.0)
MCHC: 32.7 g/dL (ref 30.0–36.0)
MCV: 83.3 fL (ref 80.0–100.0)
Monocytes Absolute: 0.2 K/uL (ref 0.1–1.0)
Monocytes Relative: 6 %
Neutro Abs: 2.6 K/uL (ref 1.7–7.7)
Neutrophils Relative %: 72 %
Platelet Count: 137 K/uL — ABNORMAL LOW (ref 150–400)
RBC: 3.53 MIL/uL — ABNORMAL LOW (ref 3.87–5.11)
RDW: 14.7 % (ref 11.5–15.5)
WBC Count: 3.7 K/uL — ABNORMAL LOW (ref 4.0–10.5)
nRBC: 0 % (ref 0.0–0.2)

## 2023-10-23 LAB — CMP (CANCER CENTER ONLY)
ALT: 51 U/L — ABNORMAL HIGH (ref 0–44)
AST: 39 U/L (ref 15–41)
Albumin: 3 g/dL — ABNORMAL LOW (ref 3.5–5.0)
Alkaline Phosphatase: 296 U/L — ABNORMAL HIGH (ref 38–126)
Anion gap: 4 — ABNORMAL LOW (ref 5–15)
BUN: 13 mg/dL (ref 6–20)
CO2: 30 mmol/L (ref 22–32)
Calcium: 7 mg/dL — ABNORMAL LOW (ref 8.9–10.3)
Chloride: 103 mmol/L (ref 98–111)
Creatinine: 0.39 mg/dL — ABNORMAL LOW (ref 0.44–1.00)
GFR, Estimated: >60 mL/min (ref >60)
Glucose, Bld: 185 mg/dL — ABNORMAL HIGH (ref 70–99)
Potassium: 3.7 mmol/L (ref 3.5–5.1)
Sodium: 137 mmol/L (ref 135–145)
Total Bilirubin: 0.4 mg/dL (ref 0.0–1.2)
Total Protein: 5.5 g/dL — ABNORMAL LOW (ref 6.5–8.1)

## 2023-10-23 MED ORDER — LIDOCAINE-EPINEPHRINE 1 %-1:100000 IJ SOLN
INTRAMUSCULAR | Status: AC
  Filled 2023-10-23: qty 1

## 2023-10-23 NOTE — Progress Notes (Signed)
 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, that along with elevated CA125, is highly suggestive of either ovarian cancer versus primary peritoneal cancer  She received cycle 1 of treatment on September 18 with good clinical response Her chronic leukocytosis is now resolved Her malignant hypercalcemia has resolved She is eating better and ascites are resolving The plan will be to continue supportive care She will return here in 2 weeks for cycle 2 of treatment I plan to repeat imaging study after cycle 3 of therapy Pancytopenia, [name] She is pancytopenic due to treatment but not symptomatic Observe only Bilateral leg edema This is due to third spacing and poor mobility and low protein status I do not recommend diuretic therapy I recommend the patient to mobilize as tolerated and to increase oral protein intake Malignant ascites She has minimal ascitic fluid I do not recommend any more paracentesis  No orders of the defined types were placed in this encounter.    Miranda Bedford, MD  INTERVAL HISTORY: she returns for surveillance follow-up after recent discharge from the hospital She is doing well She is eating more and is hungry all the time She is not moving much Denies pain She had paracentesis this morning but only over liters was removed She had multiple bowel movement Denies nausea She complained of bilateral lower extremity edema  PHYSICAL  EXAMINATION: ECOG PERFORMANCE STATUS: 2 - Symptomatic, <50% confined to bed  Vitals:   10/23/23 1404  BP: 118/66  Pulse: 100  Resp: 18  Temp: 98.8 F (37.1 C)  SpO2: 99%   Filed Weights   10/23/23 1404  Weight: 176 lb 3.2 oz (79.9 kg)    Relevant data reviewed during this visit included CBC and CMP

## 2023-10-23 NOTE — Assessment & Plan Note (Addendum)
 This is due to third spacing and poor mobility and low protein status I do not recommend diuretic therapy I recommend the patient to mobilize as tolerated and to increase oral protein intake

## 2023-10-23 NOTE — Assessment & Plan Note (Addendum)
 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

## 2023-10-23 NOTE — Assessment & Plan Note (Addendum)
 She has minimal ascitic fluid I do not recommend any more paracentesis

## 2023-10-23 NOTE — Procedures (Signed)
 Ultrasound-guided  therapeutic paracentesis performed yielding 1.3 liters of amber fluid. No immediate complications. EBL none.

## 2023-10-23 NOTE — Assessment & Plan Note (Addendum)
 She is pancytopenic due to treatment but not symptomatic Observe only

## 2023-10-24 LAB — CA 125: Cancer Antigen (CA) 125: 163 U/mL — ABNORMAL HIGH (ref 0.0–38.1)

## 2023-10-29 ENCOUNTER — Encounter: Payer: Self-pay | Admitting: Hematology and Oncology

## 2023-10-31 ENCOUNTER — Telehealth: Payer: Self-pay

## 2023-10-31 ENCOUNTER — Inpatient Hospital Stay: Attending: Hematology and Oncology | Admitting: Physician Assistant

## 2023-10-31 ENCOUNTER — Other Ambulatory Visit (HOSPITAL_COMMUNITY): Payer: Self-pay

## 2023-10-31 VITALS — BP 142/82 | HR 104 | Temp 98.6°F | Resp 20 | Wt 173.5 lb

## 2023-10-31 DIAGNOSIS — K219 Gastro-esophageal reflux disease without esophagitis: Secondary | ICD-10-CM | POA: Insufficient documentation

## 2023-10-31 DIAGNOSIS — G4733 Obstructive sleep apnea (adult) (pediatric): Secondary | ICD-10-CM | POA: Insufficient documentation

## 2023-10-31 DIAGNOSIS — R630 Anorexia: Secondary | ICD-10-CM | POA: Diagnosis not present

## 2023-10-31 DIAGNOSIS — N819 Female genital prolapse, unspecified: Secondary | ICD-10-CM | POA: Diagnosis not present

## 2023-10-31 DIAGNOSIS — C563 Malignant neoplasm of bilateral ovaries: Secondary | ICD-10-CM | POA: Insufficient documentation

## 2023-10-31 DIAGNOSIS — Z809 Family history of malignant neoplasm, unspecified: Secondary | ICD-10-CM | POA: Diagnosis not present

## 2023-10-31 DIAGNOSIS — R971 Elevated cancer antigen 125 [CA 125]: Secondary | ICD-10-CM | POA: Insufficient documentation

## 2023-10-31 DIAGNOSIS — R180 Malignant ascites: Secondary | ICD-10-CM | POA: Insufficient documentation

## 2023-10-31 DIAGNOSIS — Z5111 Encounter for antineoplastic chemotherapy: Secondary | ICD-10-CM | POA: Diagnosis not present

## 2023-10-31 DIAGNOSIS — D6481 Anemia due to antineoplastic chemotherapy: Secondary | ICD-10-CM | POA: Diagnosis not present

## 2023-10-31 DIAGNOSIS — Z800 Family history of malignant neoplasm of digestive organs: Secondary | ICD-10-CM | POA: Insufficient documentation

## 2023-10-31 DIAGNOSIS — L089 Local infection of the skin and subcutaneous tissue, unspecified: Secondary | ICD-10-CM | POA: Diagnosis not present

## 2023-10-31 DIAGNOSIS — C579 Malignant neoplasm of female genital organ, unspecified: Secondary | ICD-10-CM | POA: Diagnosis not present

## 2023-10-31 DIAGNOSIS — R600 Localized edema: Secondary | ICD-10-CM | POA: Insufficient documentation

## 2023-10-31 DIAGNOSIS — R21 Rash and other nonspecific skin eruption: Secondary | ICD-10-CM

## 2023-10-31 DIAGNOSIS — Z801 Family history of malignant neoplasm of trachea, bronchus and lung: Secondary | ICD-10-CM | POA: Diagnosis not present

## 2023-10-31 DIAGNOSIS — C569 Malignant neoplasm of unspecified ovary: Secondary | ICD-10-CM

## 2023-10-31 DIAGNOSIS — C786 Secondary malignant neoplasm of retroperitoneum and peritoneum: Secondary | ICD-10-CM | POA: Diagnosis not present

## 2023-10-31 MED ORDER — MUPIROCIN 2 % EX OINT
1.0000 | TOPICAL_OINTMENT | Freq: Two times a day (BID) | CUTANEOUS | 0 refills | Status: DC
  Filled 2023-10-31: qty 22, 11d supply, fill #0

## 2023-10-31 MED ORDER — TRIAMCINOLONE ACETONIDE 0.025 % EX OINT
1.0000 | TOPICAL_OINTMENT | Freq: Two times a day (BID) | CUTANEOUS | 0 refills | Status: DC
  Filled 2023-10-31: qty 30, 15d supply, fill #0

## 2023-10-31 NOTE — Progress Notes (Signed)
 Symptom Management Consult Note [facility]    Patient Care Team: [name], FNP as PCP - General (Family Medicine)    Name / MRN / DOB: Miranda Hill  [id number]  [birth year]   Date of visit: 10/31/2023   Chief Complaint/Reason for visit: rash   Current Therapy: Carboplatin  and taxol   Last treatment:  Day 1   Cycle 1 on 10/16/23    ASSESSMENT AND PLAN Patient is a 58 y.o. female with oncologic history of ovarian cancer followed by Dr. Lonn.  I have viewed most recent oncology note and lab work.  #Ovarian cancer - Next appointment with oncologist is 11/06/23   #Contact dermatitis of lower extremities Rash on lower extremities, possibly from contact irritation. Not itchy or infected.  - Encouraged use of hypoallergenic options like CeraVe for moisturizing if needed and avoid fragrant lotions - Prescribe triamcinolone ointment for trial on small area of rash. If rash improves can apply thin layer to rash on legs BID. - Avoid extreme heat, use cool showers. Avoid leg massager and frequent leg massages she has been doing until rash resolved.  #Localized skin infection of foot -Localized skin infection on foot, ?possibly from spider bite. Improved with Neosporin. No systemic infection signs. - Prescribe mupirocin ointment for foot infection. - Monitor for systemic infection signs such as fever or chills, report if she occurs. If no improvement would consider PO antibiotics.    Strict ED precautions discussed should symptoms worsen.   HEME/ONC HISTORY Oncology History Overview Note  MMR normal Her2 (0) negative   Ovarian cancer (HCC)  09/01/2023 Pathology Results   Pap smear is negative for malignancy   09/22/2023 Imaging   IR Paracentesis Result Date: 10/01/2023 INDICATION: Patient with recently diagnosed metastatic cancer of unknown primary with recurrent malignant ascites. Request for therapeutic paracentesis. EXAM: ULTRASOUND GUIDED  THERAPEUTIC PARACENTESIS MEDICATIONS: 6 mL 1% lidocaine  COMPLICATIONS: None immediate. PROCEDURE: Informed written consent was obtained from the patient after a discussion of the risks, benefits and alternatives to treatment. A timeout was performed prior to the initiation of the procedure. Initial ultrasound scanning demonstrates a large amount of ascites within the right lower abdominal quadrant. The right lower abdomen was prepped and draped in the usual sterile fashion. 1% lidocaine  was used for local anesthesia. Following this, a 19 gauge, 7-cm, Yueh catheter was introduced. An ultrasound image was saved for documentation purposes. The paracentesis was performed. The catheter was removed and a dressing was applied. The patient tolerated the procedure well without immediate post procedural complication. FINDINGS: A total of approximately 3.2 L of clear, amber fluid was removed. IMPRESSION: Successful ultrasound-guided paracentesis yielding 3.2 liters of peritoneal fluid. Performed by Clotilda Hesselbach, PA-C Electronically Signed   By: Ester Sides M.D.   On: 10/01/2023 12:46   NM PET Image Initial (PI) Skull Base To Thigh (F-18 FDG) Result Date: 09/25/2023 CLINICAL DATA:  Initial treatment strategy for omental and peritoneal nodularity compatible with carcinomatosis. EXAM: NUCLEAR MEDICINE PET SKULL BASE TO THIGH TECHNIQUE: 9.4 mCi F-18 FDG was injected intravenously. Full-ring PET imaging was performed from the skull base to thigh after the radiotracer. CT data was obtained and used for attenuation correction and anatomic localization. Fasting blood glucose: 116 mg/dl COMPARISON:  CT scan 1/74/7974 FINDINGS: Mediastinal blood pool activity: SUV max 2.4 Liver activity: SUV max NA NECK: No significant abnormal hypermetabolic activity in this region. Incidental CT findings: None. CHEST: Small type 1 hiatal hernia, accompanied by a small amount  of ascites, faint metabolic activity in the ascites with maximum  SUV 2.3, cannot exclude malignant ascites extension up through the hiatus. Incidental CT findings: Mild scarring or subsegmental atelectasis anteriorly in the right upper lobe. ABDOMEN/PELVIS: Heavy burden of hypermetabolic omental caking with malignant ascites and numerous foci of hypermetabolic activity along the liver capsule, right paracolic gutter, left paracolic gutter, mesentery, and pelvic ascites. A representative region of the omental caking in the left lower quadrant anterior to the descending colon a maximum SUV of 14.6. A hypermetabolic tumor deposit in the pelvic ascites anterior to the rectum on image 171 series 4 has maximum SUV of 16.0 and measures 2.7 cm in long axis. Hypermetabolic focus along the posterosuperior liver margin maximum SUV 7.8. Isolation of primary site is problematic given the tumor deposits along the cecum and expected location of the appendix as well as the adnexa. Overall moderate amount of malignant ascites. Incidental CT findings: None. SKELETON: No significant abnormal hypermetabolic activity in this region. Incidental CT findings: None. IMPRESSION: 1. Heavy burden of hypermetabolic omental caking with malignant ascites and numerous foci of hypermetabolic activity along the liver capsule, right paracolic gutter, left paracolic gutter, mesentery, and pelvic ascites. Isolation of primary site is problematic given the tumor deposits along the cecum and expected location of the appendix as well as the adnexa. Overall moderate amount of malignant ascites. 2. Small type 1 hiatal hernia, accompanied by a small amount of ascites, faint metabolic activity in the ascites with maximum SUV 2.3, cannot exclude malignant ascites extension up through the hiatus. Electronically Signed   By: Ryan Salvage M.D.   On: 09/25/2023 16:59   US  Paracentesis Result Date: 09/24/2023 INDICATION: other ascites Patient with history of bloating, abdominal distension, imaging findings of extensive  omental and peritoneal nodularity, ascites; request received for diagnostic and therapeutic paracentesis. EXAM: ULTRASOUND GUIDED DIAGNOSTIC AND THERAPEUTIC PARACENTESIS MEDICATIONS: 8 mL 1% lidocaine  with epinephrine  COMPLICATIONS: None immediate. PROCEDURE: Informed written consent was obtained from the patient after a discussion of the risks, benefits and alternatives to treatment. A timeout was performed prior to the initiation of the procedure. Initial ultrasound scanning demonstrates a large amount of ascites within the LEFT lower abdominal quadrant. The left lower abdomen was prepped and draped in the usual sterile fashion. 1% lidocaine  was used for local anesthesia. Following this, a 6 Fr Safe-T-Centesis catheter was introduced. An ultrasound image was saved for documentation purposes. The paracentesis was performed. The catheter was removed and a dressing was applied. The patient tolerated the procedure well without immediate post procedural complication. FINDINGS: A total of approximately 4.7 L of hazy, blood-tinged fluid was removed. Samples were sent to the laboratory as requested by the clinical team. IMPRESSION: Successful ultrasound-guided diagnostic and therapeutic paracentesis yielding 4.7 L of peritoneal fluid. Performed by: Franky Rakers, PA-C Electronically Signed   By: Thom Hall M.D.   On: 09/24/2023 17:10   CT ABDOMEN PELVIS W CONTRAST Result Date: 09/22/2023 CLINICAL DATA:  Bloating.  Distended abdomen. * Tracking Code: BO * EXAM: CT ABDOMEN AND PELVIS WITH CONTRAST TECHNIQUE: Multidetector CT imaging of the abdomen and pelvis was performed using the standard protocol following bolus administration of intravenous contrast. RADIATION DOSE REDUCTION: This exam was performed according to the departmental dose-optimization program which includes automated exposure control, adjustment of the mA and/or kV according to patient size and/or use of iterative reconstruction technique. CONTRAST:   100mL ISOVUE -300 IOPAMIDOL  (ISOVUE -300) INJECTION 61% COMPARISON:  None Available. FINDINGS: Lower chest: Small focus of atelectasis  in the inferior lingula. No pulmonary nodules. Hepatobiliary: No focal hepatic lesion. Low-density gallstone noted. No sickle size. Pancreas: Pancreas is normal. No ductal dilatation. No pancreatic inflammation. Spleen: Normal spleen Adrenals/urinary tract: Adrenal glands and kidneys are normal. The ureters and bladder normal. Stomach/Bowel: Stomach is normal. The small bowel is floating on moderate volume intraperitoneal free fluid within the small bowel mesentery. No bowel obstruction. Terminal ileum is normal. Appendix not clearly identified. The colon is normal. Contrast travels the entirety of the colon to the rectum. Vascular/Lymphatic: Abdominal aorta is normal caliber. No periportal or retroperitoneal adenopathy. No pelvic adenopathy. Reproductive: Uterus and ovaries are grossly normal. Other: Moderate volume intraperitoneal free fluid. There is extensive nodularity the greater omentum (omental caking, image 58/2 for example). There are peritoneal nodules noted. For example nodule adjacent the RIGHT hepatic lobe measuring 13 mm image 35/2. Peritoneal nodularity in the posterior cul-de-sac on image 84/2. Musculoskeletal: No aggressive osseous lesion. IMPRESSION: 1. 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 2. Moderate volume intraperitoneal free fluid. 3. No clear ovarian or appendiceal neoplasm. No clear primary malignancy identified. These results will be called to the ordering clinician or representative by the Radiologist Assistant, and communication documented in the PACS or [employer]. Electronically Signed   By: Jackquline Boxer M.D.   On: 09/22/2023 10:36      09/25/2023 Pathology Results   Pathology from malignant ascites came back positive for malignancy but insufficient cellularity for further characterization   10/03/2023  Initial Diagnosis   Carcinomatosis peritonei (HCC)   10/04/2023 Tumor Marker   Patient's tumor was tested for the following markers: CA-125. Results of the tumor marker test revealed 305.   10/08/2023 Procedure   Successful ultrasound-guided diagnostic and therapeutic paracentesis yielding 3.1 liters of peritoneal fluid.   10/08/2023 Pathology Results   CYTOLOGY - NON PAP  CASE: [id number]  PATIENT: Miranda Hill  Non-Gynecological Cytology Report      Clinical History: None provided  Specimen Submitted:  A. 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 PERITONEAL, LLQ, BIOPSY:  - Metastatic carcinoma, consistent with high-grade serous carcinoma (see comment).   Comment: Immunohistochemical stains are performed.  The tumor cells stain positive for WT1 and PAX8 (patchy), with overexpression of p53. Calretinin is negative.  The combined immunomorphologic features are consistent with high-grade serous carcinoma.    10/10/2023 Cancer Staging   Staging form: Ovary, Fallopian Tube, and Primary Peritoneal Carcinoma, AJCC 8th Edition - Clinical: FIGO Stage IIIC (cT3c, cN0, cM0) - Signed by Lonn Hicks, MD on 10/10/2023 Stage prefix: Initial diagnosis   10/14/2023 Procedure   Successful ultrasound-guided therapeutic paracentesis yielding 4.4 L liters of peritoneal fluid   10/16/2023 -  Chemotherapy   Patient is on Treatment Plan : OVARIAN Carboplatin  (AUC 6) + Paclitaxel  (175) q21d X 6 Cycles     10/16/2023 Tumor Marker   Patient's tumor was tested for the following markers: CA-125. Results of the tumor marker test revealed 164.   10/23/2023 Procedure   Successful  ultrasound-guided therapeutic paracentesis yielding 1.3 liters of peritoneal fluid.   10/24/2023 Tumor Marker   Patient's tumor was tested for the following markers: CA-125. Results of the tumor marker test revealed 163.  INTERVAL HISTORY  Discussed the use of AI scribe software for clinical note transcription with the patient, who gave verbal consent to proceed.    Miranda Hill is a 58 y.o. female with oncologic history as above presenting to [facility] today with chief complaint of rash. Accompanied to clinic today by spouse who provides additional history.  She noticed clear fluid oozing from her foot on Monday (x4 days ago), suspecting a spider bite. The area was initially purple, red, and inflamed, but has improved with Neosporin application. Swelling has decreased, and the fluid is no longer draining. No pain, fever, or chills, but there is itching around the swollen area.  A rash developed on her legs late Tuesday night (x3 days ago) and has progressively worsened. The rash does not feel hot or itch. She has been using various lotions, some scented, and a lotion from a massage therapist that seems effective. She has been using a massager with compression sleeves twice a day for 15-20 minutes but stopped a few days ago. The rash started after using the massager, and she is unsure if it has improved since stopping. She wears short compression socks that reach mid-calf, causing discomfort and leaving marks on her legs. She experiences tightness in her toes due to swelling. She does report the swelling in her abdomen and legs has improved. She tries to elevate legs as much as possible. Denies any shortness of breath.    ROS  All other systems are reviewed and are negative for acute change except as noted in the HPI.    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   History of chlamydia    1990   Multiple thyroid  nodules    Prediabetes    Sleep apnea    does not use CPAP due to voiding hourly every night     Past Surgical History:  Procedure Laterality Date   INTRAUTERINE DEVICE INSERTION  07/18/2008   MIRENA   IR IMAGING GUIDED PORT INSERTION  10/17/2023   IR PARACENTESIS  10/01/2023   IR PARACENTESIS  10/06/2023   IR PARACENTESIS  10/17/2023   IR PARACENTESIS  10/23/2023   SALIVARY GLAND SURGERY     ABSCESS   URETHRAL SLING  1999   WREN    Social History   Socioeconomic History   Marital status: Married    Spouse name: Not on file   Number of children: Not on file   Years of education: Not on file   Highest education level: Not on file  Occupational History   Not on file  Tobacco Use   Smoking status: Never   Smokeless tobacco: Never  Vaping Use   Vaping status: Never Used  Substance and Sexual Activity   Alcohol use: Not Currently   Drug use: No   Sexual activity: Yes    Birth control/protection: Post-menopausal    Comment: husband with vasectomy  Other Topics Concern   Not on file  Social History Narrative   Not on file   Social Drivers of Health   Financial Resource Strain: Not on file  Food Insecurity: No Food Insecurity (10/14/2023)   Hunger Vital  Sign    Worried About Programme researcher, broadcasting/film/video in the Last Year: Never true    Ran Out of Food in the Last Year: Never true  Transportation Needs: No Transportation Needs (10/14/2023)   PRAPARE - Administrator, Civil Service (Medical): No    Lack of Transportation (Non-Medical): No  Physical Activity: Not on file  Stress: Not on file  Social Connections: Socially Integrated (10/14/2023)   Social Connection and Isolation Panel    Frequency  of Communication with Friends and Family: More than three times a week    Frequency of Social Gatherings with Friends and Family: More than three times a week    Attends Religious Services: More than 4 times per year    Active Member of [employer] or Organizations: Yes    Attends Banker Meetings: 1 to 4 times per year    Marital Status: Married  Catering manager Violence: Not At Risk (10/14/2023)   Humiliation, Afraid, Rape, and Kick questionnaire    Fear of Current or Ex-Partner: No    Emotionally Abused: No    Physically Abused: No    Sexually Abused: No    Family History  Problem Relation Age of Onset   Hypertension Mother    Diabetes Mother    Colon polyps Mother    Colon cancer Mother    Hypertension Father    Cancer Father        lung and brain   Diabetes Father    Other Sister        hypoglycemic   Colon polyps Sister    Depression Brother    Cancer Maternal Grandmother        UTERINE     Current Outpatient Medications:    mupirocin ointment (BACTROBAN) 2 %, Apply topically to left foot 2 (two) times daily, Disp: 22 g, Rfl: 0   triamcinolone (KENALOG) 0.025 % ointment, Apply topically to legs 2 (two) times daily., Disp: 30 g, Rfl: 0   lidocaine -prilocaine  (EMLA ) cream, Apply to affected area once, Disp: 30 g, Rfl: 3   omeprazole (PRILOSEC OTC) 20 MG tablet, Take 20 mg by mouth daily before breakfast., Disp: , Rfl:    ondansetron  (ZOFRAN ) 8 MG tablet, Take 1 tablet (8 mg total) by mouth every 8 (eight) hours as needed for nausea or vomiting., Disp: 60 tablet, Rfl: 1   polyethylene glycol (MIRALAX  / GLYCOLAX ) 17 g packet, Take 17 g by mouth daily., Disp: 14 each, Rfl: 0   prochlorperazine  (COMPAZINE ) 10 MG tablet, Take 1 tablet (10 mg total) by mouth every 6 (six) hours as needed for nausea or vomiting., Disp: 30 tablet, Rfl: 1  PHYSICAL EXAM ECOG FS:1 - Symptomatic but completely ambulatory    Vitals:   10/31/23 1410  BP: (!) 142/82  Pulse: (!) 104   Resp: 20  Temp: 98.6 F (37 C)  TempSrc: Oral  SpO2: 99%  Weight: 173 lb 8 oz (78.7 kg)   Physical Exam Vitals and nursing note reviewed.  Constitutional:      Appearance: She is not ill-appearing or toxic-appearing.  HENT:     Head: Normocephalic.  Eyes:     Conjunctiva/sclera: Conjunctivae normal.  Cardiovascular:     Rate and Rhythm: Tachycardia present.  Pulmonary:     Effort: Pulmonary effort is normal.  Abdominal:     General: There is no distension.  Musculoskeletal:     Cervical back: Normal range of motion.     Right lower leg:  Edema present.     Left lower leg: Edema present.  Skin:    General: Skin is warm and dry.     Comments: Please see media below.  Area of erythema on left foot measuring 3 x 3 cm. Mild warmth. No purulent drainage or palpable fluctuance.  Macular rash on bilateral shins. No warmth or tenderness. No open lesions or wounds.  Neurological:     Mental Status: She is alert.        LABORATORY DATA I have reviewed the data as listed    Latest Ref Rng & Units 10/23/2023    1:40 PM 10/17/2023    5:26 AM 10/16/2023    5:57 AM  CBC  WBC 4.0 - 10.5 K/uL 3.7  17.2  25.0   Hemoglobin 12.0 - 15.0 g/dL 9.6  89.0  88.0   Hematocrit 36.0 - 46.0 % 29.4  37.5  38.2   Platelets 150 - 400 K/uL 137  297  393         Latest Ref Rng & Units 10/23/2023    1:40 PM 10/18/2023    6:43 AM 10/17/2023    5:26 AM  CMP  Glucose 70 - 99 mg/dL 814  812  839   BUN 6 - 20 mg/dL 13  21  25    Creatinine 0.44 - 1.00 mg/dL 9.60  9.45  9.42   Sodium 135 - 145 mmol/L 137  133  127   Potassium 3.5 - 5.1 mmol/L 3.7  4.2  4.8   Chloride 98 - 111 mmol/L 103  102  98   CO2 22 - 32 mmol/L 30  22  19    Calcium 8.9 - 10.3 mg/dL 7.0  8.8  89.1   Total Protein 6.5 - 8.1 g/dL 5.5  4.1  4.6   Total Bilirubin 0.0 - 1.2 mg/dL 0.4  0.6  1.1   Alkaline Phos 38 - 126 U/L 296  386  391   AST 15 - 41 U/L 39  120  100   ALT 0 - 44 U/L 51  99  114        RADIOGRAPHIC STUDIES  (from last 24 hours if applicable) I have personally reviewed the radiological images as listed and agreed with the findings in the report. No results found.      Visit Diagnosis: 1. Malignant neoplasm of ovary, unspecified laterality (HCC)   2. Rash      No orders of the defined types were placed in this encounter.   All questions were answered. The patient knows to call the clinic with any problems, questions or concerns. No barriers to learning was detected.  A total of more than 30 minutes were spent on this encounter with face-to-face time and non-face-to-face time, including preparing to see the patient, ordering tests and/or medications, counseling the patient and coordination of care as outlined above.    Thank you for allowing me to participate in the care of this patient.    Anthony Roland E  Walisiewicz, PA-C Department of Hematology/Oncology [facility] Phone: [phone]  [phone]    10/31/2023 4:40 PM

## 2023-10-31 NOTE — Telephone Encounter (Signed)
 Called and offered appt with [facility] today to evaluate rash. Husband agreed to 2 pm appt and is aware of appt.

## 2023-11-03 ENCOUNTER — Other Ambulatory Visit (HOSPITAL_COMMUNITY): Payer: Self-pay

## 2023-11-04 NOTE — Assessment & Plan Note (Signed)
 I have reviewed multiple imaging studies with the patient and her son The patient has developed malignant ascites and abdominal carcinomatosis, source is unknown Her gastroenterologist will arrange for EGD and colonoscopy CT-guided biopsy of peritoneal disease was done, result pending I reviewed recent cytology from paracentesis which came back high-grade serous cancer, that along with elevated CA125, is highly suggestive of either ovarian cancer versus primary peritoneal cancer  She received cycle 1 of treatment on September 18 with good clinical response Her chronic leukocytosis had resolved Her malignant hypercalcemia has resolved She is eating better and ascites are resolving We will proceed with chemotherapy as scheduled I plan to repeat imaging study after cycle 3 of therapy

## 2023-11-06 ENCOUNTER — Encounter: Payer: Self-pay | Admitting: Hematology and Oncology

## 2023-11-06 ENCOUNTER — Inpatient Hospital Stay: Admitting: Hematology and Oncology

## 2023-11-06 ENCOUNTER — Other Ambulatory Visit (HOSPITAL_COMMUNITY): Payer: Self-pay

## 2023-11-06 ENCOUNTER — Inpatient Hospital Stay: Admitting: Licensed Clinical Social Worker

## 2023-11-06 ENCOUNTER — Ambulatory Visit

## 2023-11-06 VITALS — BP 143/65 | HR 100 | Temp 98.5°F | Resp 17 | Wt 169.8 lb

## 2023-11-06 DIAGNOSIS — R180 Malignant ascites: Secondary | ICD-10-CM | POA: Diagnosis not present

## 2023-11-06 DIAGNOSIS — T451X5A Adverse effect of antineoplastic and immunosuppressive drugs, initial encounter: Secondary | ICD-10-CM | POA: Diagnosis not present

## 2023-11-06 DIAGNOSIS — R630 Anorexia: Secondary | ICD-10-CM | POA: Diagnosis not present

## 2023-11-06 DIAGNOSIS — R600 Localized edema: Secondary | ICD-10-CM | POA: Diagnosis not present

## 2023-11-06 DIAGNOSIS — C569 Malignant neoplasm of unspecified ovary: Secondary | ICD-10-CM

## 2023-11-06 DIAGNOSIS — G4733 Obstructive sleep apnea (adult) (pediatric): Secondary | ICD-10-CM | POA: Diagnosis not present

## 2023-11-06 DIAGNOSIS — N819 Female genital prolapse, unspecified: Secondary | ICD-10-CM | POA: Diagnosis not present

## 2023-11-06 DIAGNOSIS — R634 Abnormal weight loss: Secondary | ICD-10-CM | POA: Diagnosis not present

## 2023-11-06 DIAGNOSIS — C786 Secondary malignant neoplasm of retroperitoneum and peritoneum: Secondary | ICD-10-CM | POA: Diagnosis not present

## 2023-11-06 DIAGNOSIS — R971 Elevated cancer antigen 125 [CA 125]: Secondary | ICD-10-CM | POA: Diagnosis not present

## 2023-11-06 DIAGNOSIS — L089 Local infection of the skin and subcutaneous tissue, unspecified: Secondary | ICD-10-CM | POA: Diagnosis not present

## 2023-11-06 DIAGNOSIS — D6481 Anemia due to antineoplastic chemotherapy: Secondary | ICD-10-CM | POA: Diagnosis not present

## 2023-11-06 DIAGNOSIS — Z800 Family history of malignant neoplasm of digestive organs: Secondary | ICD-10-CM | POA: Diagnosis not present

## 2023-11-06 DIAGNOSIS — Z5111 Encounter for antineoplastic chemotherapy: Secondary | ICD-10-CM | POA: Diagnosis not present

## 2023-11-06 DIAGNOSIS — C563 Malignant neoplasm of bilateral ovaries: Secondary | ICD-10-CM | POA: Diagnosis not present

## 2023-11-06 DIAGNOSIS — Z809 Family history of malignant neoplasm, unspecified: Secondary | ICD-10-CM | POA: Diagnosis not present

## 2023-11-06 DIAGNOSIS — C579 Malignant neoplasm of female genital organ, unspecified: Secondary | ICD-10-CM | POA: Diagnosis not present

## 2023-11-06 DIAGNOSIS — Z801 Family history of malignant neoplasm of trachea, bronchus and lung: Secondary | ICD-10-CM | POA: Diagnosis not present

## 2023-11-06 LAB — CMP (CANCER CENTER ONLY)
ALT: 21 U/L (ref 0–44)
AST: 18 U/L (ref 15–41)
Albumin: 3.9 g/dL (ref 3.5–5.0)
Alkaline Phosphatase: 173 U/L — ABNORMAL HIGH (ref 38–126)
Anion gap: 6 (ref 5–15)
BUN: 18 mg/dL (ref 6–20)
CO2: 32 mmol/L (ref 22–32)
Calcium: 9.2 mg/dL (ref 8.9–10.3)
Chloride: 102 mmol/L (ref 98–111)
Creatinine: 0.49 mg/dL (ref 0.44–1.00)
GFR, Estimated: >60 mL/min (ref >60)
Glucose, Bld: 146 mg/dL — ABNORMAL HIGH (ref 70–99)
Potassium: 3.9 mmol/L (ref 3.5–5.1)
Sodium: 140 mmol/L (ref 135–145)
Total Bilirubin: 0.4 mg/dL (ref 0.0–1.2)
Total Protein: 7 g/dL (ref 6.5–8.1)

## 2023-11-06 LAB — CBC WITH DIFFERENTIAL (CANCER CENTER ONLY)
Abs Immature Granulocytes: 0.25 K/uL — ABNORMAL HIGH (ref 0.00–0.07)
Basophils Absolute: 0.1 K/uL (ref 0.0–0.1)
Basophils Relative: 1 %
Eosinophils Absolute: 0.1 K/uL (ref 0.0–0.5)
Eosinophils Relative: 1 %
HCT: 32.7 % — ABNORMAL LOW (ref 36.0–46.0)
Hemoglobin: 10.2 g/dL — ABNORMAL LOW (ref 12.0–15.0)
Immature Granulocytes: 3 %
Lymphocytes Relative: 18 %
Lymphs Abs: 1.4 K/uL (ref 0.7–4.0)
MCH: 26.4 pg (ref 26.0–34.0)
MCHC: 31.2 g/dL (ref 30.0–36.0)
MCV: 84.5 fL (ref 80.0–100.0)
Monocytes Absolute: 0.7 K/uL (ref 0.1–1.0)
Monocytes Relative: 9 %
Neutro Abs: 5.3 K/uL (ref 1.7–7.7)
Neutrophils Relative %: 68 %
Platelet Count: 279 K/uL (ref 150–400)
RBC: 3.87 MIL/uL (ref 3.87–5.11)
RDW: 17.2 % — ABNORMAL HIGH (ref 11.5–15.5)
WBC Count: 7.7 K/uL (ref 4.0–10.5)
nRBC: 0 % (ref 0.0–0.2)

## 2023-11-06 MED ORDER — APREPITANT 130 MG/18ML IV EMUL
130.0000 mg | Freq: Once | INTRAVENOUS | Status: AC
  Administered 2023-11-06: 130 mg via INTRAVENOUS
  Filled 2023-11-06: qty 18

## 2023-11-06 MED ORDER — SODIUM CHLORIDE 0.9 % IV SOLN
693.0000 mg | Freq: Once | INTRAVENOUS | Status: AC
  Administered 2023-11-06: 690 mg via INTRAVENOUS
  Filled 2023-11-06: qty 69

## 2023-11-06 MED ORDER — FAMOTIDINE IN NACL 20-0.9 MG/50ML-% IV SOLN
20.0000 mg | Freq: Once | INTRAVENOUS | Status: AC
  Administered 2023-11-06: 20 mg via INTRAVENOUS
  Filled 2023-11-06: qty 50

## 2023-11-06 MED ORDER — DIPHENHYDRAMINE HCL 50 MG/ML IJ SOLN
25.0000 mg | Freq: Once | INTRAMUSCULAR | Status: AC
  Administered 2023-11-06: 25 mg via INTRAVENOUS
  Filled 2023-11-06: qty 1

## 2023-11-06 MED ORDER — CETIRIZINE HCL 10 MG/ML IV SOLN
10.0000 mg | Freq: Once | INTRAVENOUS | Status: AC
  Administered 2023-11-06: 10 mg via INTRAVENOUS
  Filled 2023-11-06: qty 1

## 2023-11-06 MED ORDER — PALONOSETRON HCL INJECTION 0.25 MG/5ML
0.2500 mg | Freq: Once | INTRAVENOUS | Status: AC
  Administered 2023-11-06: 0.25 mg via INTRAVENOUS
  Filled 2023-11-06: qty 5

## 2023-11-06 MED ORDER — DEXAMETHASONE 4 MG PO TABS
ORAL_TABLET | ORAL | 6 refills | Status: AC
  Filled 2023-11-06: qty 24, 6d supply, fill #0

## 2023-11-06 MED ORDER — DEXAMETHASONE SOD PHOSPHATE PF 10 MG/ML IJ SOLN
10.0000 mg | Freq: Once | INTRAMUSCULAR | Status: AC
  Administered 2023-11-06: 10 mg via INTRAVENOUS

## 2023-11-06 MED ORDER — SODIUM CHLORIDE 0.9 % IV SOLN
175.0000 mg/m2 | Freq: Once | INTRAVENOUS | Status: AC
  Administered 2023-11-06: 318 mg via INTRAVENOUS
  Filled 2023-11-06: qty 53

## 2023-11-06 MED ORDER — SODIUM CHLORIDE 0.9 % IV SOLN: INTRAVENOUS | Status: DC

## 2023-11-06 NOTE — Patient Instructions (Signed)
 [facility] - A DEPT OF [facility]  Discharge Instructions: Thank you for choosing [facility] to provide your oncology and hematology care.   If you have a lab appointment with the Cancer Center, please go directly to the Cancer Center and check in at the registration area.   Wear comfortable clothing and clothing appropriate for easy access to any Portacath or PICC line.   We strive to give you quality time with your provider. You may need to reschedule your appointment if you arrive late (15 or more minutes).  Arriving late affects you and other patients whose appointments are after yours.  Also, if you miss three or more appointments without notifying the office, you may be dismissed from the clinic at the provider's discretion.      For prescription refill requests, have your pharmacy contact our office and allow 72 hours for refills to be completed.    Today you received the following chemotherapy and/or immunotherapy agents: Paclitaxel  (Taxol ) & Carboplatin  (Paraplatin )      To help prevent nausea and vomiting after your treatment, we encourage you to take your nausea medication as directed.  BELOW ARE SYMPTOMS THAT SHOULD BE REPORTED IMMEDIATELY: *FEVER GREATER THAN 100.4 F (38 C) OR HIGHER *CHILLS OR SWEATING *NAUSEA AND VOMITING THAT IS NOT CONTROLLED WITH YOUR NAUSEA MEDICATION *UNUSUAL SHORTNESS OF BREATH *UNUSUAL BRUISING OR BLEEDING *URINARY PROBLEMS (pain or burning when urinating, or frequent urination) *BOWEL PROBLEMS (unusual diarrhea, constipation, pain near the anus) TENDERNESS IN MOUTH AND THROAT WITH OR WITHOUT PRESENCE OF ULCERS (sore throat, sores in mouth, or a toothache) UNUSUAL RASH, SWELLING OR PAIN  UNUSUAL VAGINAL DISCHARGE OR ITCHING   Items with * indicate a potential emergency and should be followed up as soon as possible or go to the Emergency Department if any problems should occur.  Please show the  CHEMOTHERAPY ALERT CARD or IMMUNOTHERAPY ALERT CARD at check-in to the Emergency Department and triage nurse.  Should you have questions after your visit or need to cancel or reschedule your appointment, please contact [facility] - A DEPT OF [facility]  Dept: [phone]  and follow the prompts.  Office hours are 8:00 a.m. to 4:30 p.m. Monday - Friday. Please note that voicemails left after 4:00 p.m. may not be returned until the following business day.  We are closed weekends and major holidays. You have access to a nurse at all times for urgent questions. Please call the main number to the clinic Dept: [phone] and follow the prompts.   For any non-urgent questions, you may also contact your provider using MyChart. We now offer e-Visits for anyone 41 and older to request care online for non-urgent symptoms. For details visit [email or web address].   Also download the MyChart app! Go to the app store, search MyChart, open the app, select [facility], and log in with your MyChart username and password.

## 2023-11-06 NOTE — Assessment & Plan Note (Addendum)
 Her leg edema has resolved She will continue on high-protein intake and I encouraged her to exercise as tolerated

## 2023-11-06 NOTE — Assessment & Plan Note (Addendum)
 Abdominal ascites is resolving She does not need paracentesis

## 2023-11-06 NOTE — Assessment & Plan Note (Addendum)
 Recent weight loss is due to resolution of ascites, fluid retention and constipation I will readjust the dose of her treatment based on today's weight

## 2023-11-06 NOTE — Assessment & Plan Note (Addendum)
 This is likely due to recent treatment. The patient denies recent history of bleeding such as epistaxis, hematuria or hematochezia. She is asymptomatic from the anemia. I will observe for now.  She does not require transfusion now. I will continue the chemotherapy at current dose without dosage adjustment.  If the anemia gets progressive worse in the future, I might have to delay her treatment or adjust the chemotherapy dose.

## 2023-11-06 NOTE — Progress Notes (Signed)
 [facility] CSW Progress Note  Visual merchandiser met with patient to follow-up on emotional support and financial concerns.  Pt physically improved since CSW last saw her prior to hospital stay. 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 She continues to have strong support from her faith, husband, sons, and family.  Interventions: Provided information about cancer foundations for potential assistance Office manager) Provided application and instructions for documents/information needed for [employer] Requested follow-up from [facility] with pt (not connected as pt was hospitalized)    Follow Up Plan:  Patient will complete [employer] [facility] application. Patient will contact CSW with any support/resource needs and/or for help submitting applications    Aaronmichael Brumbaugh E Antonietta Lansdowne, LCSW Clinical Social Worker [facility]

## 2023-11-06 NOTE — Progress Notes (Signed)
 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, that along with elevated CA125, is highly suggestive of either ovarian cancer versus primary peritoneal cancer  She received cycle 1 of treatment on September 18 with good clinical response Her chronic leukocytosis had resolved Her malignant hypercalcemia has resolved She is eating better and ascites are resolving We will proceed with chemotherapy as scheduled I plan to repeat imaging study after cycle 3 of therapy Bilateral leg edema Her leg edema has resolved She will continue on high-protein intake and I encouraged her to exercise as tolerated Malignant ascites (HCC) Abdominal ascites is resolving She does not need paracentesis Anemia due to antineoplastic chemotherapy This is likely due to recent treatment. The patient denies recent history of bleeding such as epistaxis, hematuria or hematochezia. She is asymptomatic from the anemia. I will observe for now.  She does not require transfusion now. I will continue the chemotherapy at current dose without dosage adjustment.  If the anemia gets progressive worse in the future, I might have to delay her treatment or adjust the chemotherapy dose.  Weight loss Recent weight loss is due to resolution of ascites, fluid retention and constipation I will readjust the dose of her treatment based on today's weight  No orders of the defined types were placed in this encounter.     Almarie Bedford, MD  INTERVAL HISTORY: she returns for treatment follow-up Complications related to previous cycle of chemotherapy included anemia,, weight loss,, and infection, She was recently evaluated for insect bite on her foot That has resolved Her leg swelling has improved Constipation is resolving Her abdominal distention is less She has minimum nausea Denies recent vaginal bleeding She is urinating better She has minor changes to her sensation at the toes  PHYSICAL EXAMINATION: ECOG PERFORMANCE STATUS: 1 - Symptomatic but completely ambulatory  Lab Results  Component Value Date   CAN125 163.0 (H) 10/23/2023   CAN125 164.0 (H) 10/14/2023   CAN125 305.0 (H) 10/03/2023      Latest Ref Rng & Units 11/06/2023   10:59 AM 10/23/2023    1:40 PM 10/17/2023    5:26 AM  CBC  WBC 4.0 - 10.5 K/uL 7.7  3.7  17.2   Hemoglobin 12.0 - 15.0 g/dL 89.7  9.6  89.0   Hematocrit 36.0 - 46.0 % 32.7  29.4  37.5   Platelets 150 - 400 K/uL 279  137  297       Chemistry      Component Value Date/Time   NA 140 11/06/2023 1059   K 3.9 11/06/2023 1059   CL 102 11/06/2023 1059   CO2 32 11/06/2023 1059   BUN 18 11/06/2023 1059   CREATININE 0.49 11/06/2023 1059   CREATININE 0.78 02/18/2018 1553      Component Value Date/Time   CALCIUM 9.2 11/06/2023 1059   ALKPHOS 173 (H) 11/06/2023 1059   AST 18 11/06/2023 1059   ALT 21 11/06/2023 1059   BILITOT 0.4 11/06/2023 1059       There were no vitals filed for this visit.  There were no vitals filed for this visit. Other relevant data reviewed during this visit included CBC and CMP

## 2023-11-07 ENCOUNTER — Encounter: Payer: Self-pay | Admitting: Gynecologic Oncology

## 2023-11-07 ENCOUNTER — Telehealth: Payer: Self-pay | Admitting: *Deleted

## 2023-11-07 ENCOUNTER — Inpatient Hospital Stay: Admitting: Gynecologic Oncology

## 2023-11-07 ENCOUNTER — Other Ambulatory Visit: Payer: Self-pay

## 2023-11-07 VITALS — BP 142/76 | HR 92 | Temp 98.9°F | Resp 19 | Ht 62.0 in | Wt 168.6 lb

## 2023-11-07 DIAGNOSIS — C569 Malignant neoplasm of unspecified ovary: Secondary | ICD-10-CM

## 2023-11-07 DIAGNOSIS — L089 Local infection of the skin and subcutaneous tissue, unspecified: Secondary | ICD-10-CM | POA: Diagnosis not present

## 2023-11-07 DIAGNOSIS — C786 Secondary malignant neoplasm of retroperitoneum and peritoneum: Secondary | ICD-10-CM

## 2023-11-07 DIAGNOSIS — R180 Malignant ascites: Secondary | ICD-10-CM | POA: Diagnosis not present

## 2023-11-07 DIAGNOSIS — R971 Elevated cancer antigen 125 [CA 125]: Secondary | ICD-10-CM | POA: Diagnosis not present

## 2023-11-07 DIAGNOSIS — R630 Anorexia: Secondary | ICD-10-CM | POA: Diagnosis not present

## 2023-11-07 DIAGNOSIS — C563 Malignant neoplasm of bilateral ovaries: Secondary | ICD-10-CM | POA: Diagnosis not present

## 2023-11-07 DIAGNOSIS — G4733 Obstructive sleep apnea (adult) (pediatric): Secondary | ICD-10-CM | POA: Diagnosis not present

## 2023-11-07 DIAGNOSIS — D6481 Anemia due to antineoplastic chemotherapy: Secondary | ICD-10-CM | POA: Diagnosis not present

## 2023-11-07 DIAGNOSIS — N393 Stress incontinence (female) (male): Secondary | ICD-10-CM

## 2023-11-07 DIAGNOSIS — R978 Other abnormal tumor markers: Secondary | ICD-10-CM

## 2023-11-07 DIAGNOSIS — R600 Localized edema: Secondary | ICD-10-CM | POA: Diagnosis not present

## 2023-11-07 DIAGNOSIS — Z801 Family history of malignant neoplasm of trachea, bronchus and lung: Secondary | ICD-10-CM | POA: Diagnosis not present

## 2023-11-07 DIAGNOSIS — Z5111 Encounter for antineoplastic chemotherapy: Secondary | ICD-10-CM | POA: Diagnosis not present

## 2023-11-07 DIAGNOSIS — Z809 Family history of malignant neoplasm, unspecified: Secondary | ICD-10-CM | POA: Diagnosis not present

## 2023-11-07 DIAGNOSIS — N819 Female genital prolapse, unspecified: Secondary | ICD-10-CM

## 2023-11-07 DIAGNOSIS — Z800 Family history of malignant neoplasm of digestive organs: Secondary | ICD-10-CM | POA: Diagnosis not present

## 2023-11-07 DIAGNOSIS — C579 Malignant neoplasm of female genital organ, unspecified: Secondary | ICD-10-CM | POA: Diagnosis not present

## 2023-11-07 DIAGNOSIS — K219 Gastro-esophageal reflux disease without esophagitis: Secondary | ICD-10-CM | POA: Diagnosis not present

## 2023-11-07 LAB — CA 125: Cancer Antigen (CA) 125: 185 U/mL — ABNORMAL HIGH (ref 0.0–38.1)

## 2023-11-07 NOTE — Progress Notes (Signed)
 GYNECOLOGIC ONCOLOGY NEW PATIENT CONSULTATION   Patient Name: Miranda Hill  Patient Age: 58 y.o. Date of Service: 11/07/23 Referring Provider: Dr. 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  We reviewed her presentation and treatment today in detail.  We looked at CT images together.  Overall, she has had significant improvement in her symptomatology since starting chemotherapy.  Discussed her improvement in symptoms and lack of needing repeat paracentesis in the last 2 weeks as a likely sign that she is responding to treatment.  Also significantly, calcium has normalized with start of treatment and Zometa  administration, nutrition is improving, and LFTs are normalizing.  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 I discussed that the treatment approach for this disease is typically combination of cytoreductive surgery and chemotherapy. I discussed that sequencing of this can be either with upfront debulking followed by adjuvant chemotherapy sequentially or neoadjuvant chemotherapy followed by an interval cytoreductive attempt, then additional chemotherapy. This latter approach is associated with a reduced perioperative morbidity at the time of surgery.  I discussed that decisions regarding sequencing of therapy is individualized taking into account individual patient health, in addition to the apparent tumor distribution on imaging, and likelihood of complete surgical resection at the time of surgery. I discussed that the overall survival observed in patients is equivalent for both approaches (neoadjuvant chemotherapy versus primary debulking surgery) provided that there is an optimal cytoreductive effort at the time of surgery (regardless of the timing of that surgery). Given the observed  decreased morbidity of neoadjuvant chemotherapy, with preserved survival outcomes, in the setting of her significant burden of disease and ascites, decision was made to start with neoadjuvant chemotherapy.    We discussed referral to genetics for germline testing.  If there is sufficient tissue from her tissue biopsy, we will send somatic testing at this time to.  Otherwise, will await pathology from interval debulking surgery.  She needs chest imaging.  She will get repeat scans after cycle 3 and can have chest imaging at this time.  She has a longstanding history of pelvic organ prolapse and stress urinary incontinence, previously saw urology.  Has used a pessary for treatment.  Had a sling placed over 20 years ago.  Pelvic exam was deferred today given plan for repeat pelvic exam when I see her after cycle 3 and repeat imaging.  Discussed possible referral to urogynecology for evaluation of her prolapse and urinary symptoms to see if she would be a candidate and benefit from any concurrent procedures at the time of her debulking surgery.  A copy of this note was sent to the patient's referring provider.   60 minutes of total time was spent for this patient encounter, including preparation, face-to-face counseling with the patient and coordination of care, and documentation of the encounter.  Comer Dollar, MD  Division of Gynecologic Oncology  Department of Obstetrics and Gynecology  [facility]  ___________________________________________  Chief Complaint: Chief Complaint  Patient presents with   Malignant neoplasm of ovary, unspecified laterality [facility])    History of Present Illness:  [name] is a 58 y.o. y.o. female who is seen in consultation at the request of Dr. Manas for an evaluation of gynecologic malignancy.  The patient was recently seen in August for a women's wellness visit.  At that time,  she endorsed ongoing prolapse with desire to try  a pessary again.  On 09/22/2023, given abdominal symptoms, she underwent CT of the abdomen and pelvis showing moderate volume ascites, extensive omental disease and carcinomatosis.  No clear source of malignancy identified.  She then saw her primary care provider on 09/23/2023.  At that visit, she endorsed abdominal distention for 3 weeks, progressive.  She also endorsed some shortness of breath, worse on exertion. On 8/27, she underwent paracentesis with removal of 4.7 L of fluid.  This showed malignant cells with insufficient cellularity for further characterization. PET scan was then performed on 8/28 showing significant burden of hypermetabolic omental disease, malignant ascites, numerous foci of hypermetabolic activity along the liver capsule, bilateral paracolic gutters, mesentery and along the cecum/appendix, adnexa. Underwent paracentesis on 3 separate occasions between 9/3 and 9/10, with over 3 L removed and each procedure. She met with Dr. Lonn on 9/5. Was notable for normal CEA and CA 19-9, CA-125 elevated at 305. Biopsy was performed of left lower quadrant peritoneal carcinomatosis on 9/10.  Pathology from this revealed metastatic carcinoma consistent with high-grade serous carcinoma, positive for WT1, patchy positive PAX8, overexpression of p53.  MMRp.  Patient required admission on 9/16 in the setting of malignant hypercalcemia.  She received calcitonin, Zometa , and IV hydration.  Chemotherapy was given inpatient on 9/18 after port was placed.  She underwent repeat paracentesis on 9/19 with over 3 L removed. She underwent paracentesis again on 9/25 with 1.3 L of fluid removed.  She received cycle 2 of carboplatin  and paclitaxel  on 10/9.  She reports improvement in bloating and distention. Using miralax  for constipation with improvement.  Reflux symptoms are much better.  Still having some discomfort in the epigastric region that she attributes to her hiatal hernia. Appetite has been  normal. States that she will get occasional cramps and heartburn in the epigastric area after eating, but pain responds will to Tums. Reports occasional nausea but is controled with Zofran . Patient denies any vaginal bleeding and urinary symptoms   Patient has previously seen Dr. Watt and Dr. Dallie for pelvic organ prolapse and stress urinary incontinence.  Has used a pessary previously with improvement of her symptoms.  He has a remote history of a sling placed 26 years ago.  For many years she had to sit in a certain position to urinate.  More recently had been really having to push to empty her bladder, which has improved since starting treatment.  Working on staying hydrated.  Appetite has improved, increasing protein intake.  Previously had surgery to remove a salivary gland that was enlarged.  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  Treatment History: Oncology History Overview Note  MMR normal Her2 (0) negative   Ovarian cancer (HCC)  09/01/2023 Pathology Results   Pap smear is negative for malignancy   09/22/2023 Imaging   IR Paracentesis Result Date: 10/01/2023 INDICATION: Patient with recently diagnosed metastatic cancer of unknown primary with recurrent malignant ascites. Request for therapeutic paracentesis. EXAM: ULTRASOUND GUIDED THERAPEUTIC PARACENTESIS MEDICATIONS: 6 mL 1% lidocaine  COMPLICATIONS: None immediate. PROCEDURE: Informed written consent was obtained from the patient after a discussion of the risks, benefits and alternatives to treatment. A timeout was performed prior to the initiation of the procedure. Initial ultrasound scanning demonstrates a large amount of ascites within the right lower abdominal quadrant. The right lower abdomen  was prepped and draped in the usual sterile fashion. 1% lidocaine  was used for local anesthesia. Following  this, a 19 gauge, 7-cm, Yueh catheter was introduced. An ultrasound image was saved for documentation purposes. The paracentesis was performed. The catheter was removed and a dressing was applied. The patient tolerated the procedure well without immediate post procedural complication. FINDINGS: A total of approximately 3.2 L of clear, amber fluid was removed. IMPRESSION: Successful ultrasound-guided paracentesis yielding 3.2 liters of peritoneal fluid. Performed by Clotilda Hesselbach, PA-C Electronically Signed   By: Ester Sides M.D.   On: 10/01/2023 12:46   NM PET Image Initial (PI) Skull Base To Thigh (F-18 FDG) Result Date: 09/25/2023 CLINICAL DATA:  Initial treatment strategy for omental and peritoneal nodularity compatible with carcinomatosis. EXAM: NUCLEAR MEDICINE PET SKULL BASE TO THIGH TECHNIQUE: 9.4 mCi F-18 FDG was injected intravenously. Full-ring PET imaging was performed from the skull base to thigh after the radiotracer. CT data was obtained and used for attenuation correction and anatomic localization. Fasting blood glucose: 116 mg/dl COMPARISON:  CT scan 1/74/7974 FINDINGS: Mediastinal blood pool activity: SUV max 2.4 Liver activity: SUV max NA NECK: No significant abnormal hypermetabolic activity in this region. Incidental CT findings: None. CHEST: Small type 1 hiatal hernia, accompanied by a small amount of ascites, faint metabolic activity in the ascites with maximum SUV 2.3, cannot exclude malignant ascites extension up through the hiatus. Incidental CT findings: Mild scarring or subsegmental atelectasis anteriorly in the right upper lobe. ABDOMEN/PELVIS: Heavy burden of hypermetabolic omental caking with malignant ascites and numerous foci of hypermetabolic activity along the liver capsule, right paracolic gutter, left paracolic gutter, mesentery, and pelvic ascites. A representative region of the omental caking in the left lower quadrant anterior to the descending colon a maximum SUV of  14.6. A hypermetabolic tumor deposit in the pelvic ascites anterior to the rectum on image 171 series 4 has maximum SUV of 16.0 and measures 2.7 cm in long axis. Hypermetabolic focus along the posterosuperior liver margin maximum SUV 7.8. Isolation of primary site is problematic given the tumor deposits along the cecum and expected location of the appendix as well as the adnexa. Overall moderate amount of malignant ascites. Incidental CT findings: None. SKELETON: No significant abnormal hypermetabolic activity in this region. Incidental CT findings: None. IMPRESSION: 1. Heavy burden of hypermetabolic omental caking with malignant ascites and numerous foci of hypermetabolic activity along the liver capsule, right paracolic gutter, left paracolic gutter, mesentery, and pelvic ascites. Isolation of primary site is problematic given the tumor deposits along the cecum and expected location of the appendix as well as the adnexa. Overall moderate amount of malignant ascites. 2. Small type 1 hiatal hernia, accompanied by a small amount of ascites, faint metabolic activity in the ascites with maximum SUV 2.3, cannot exclude malignant ascites extension up through the hiatus. Electronically Signed   By: Ryan Salvage M.D.   On: 09/25/2023 16:59   US  Paracentesis Result Date: 09/24/2023 INDICATION: other ascites Patient with history of bloating, abdominal distension, imaging findings of extensive omental and peritoneal nodularity, ascites; request received for diagnostic and therapeutic paracentesis. EXAM: ULTRASOUND GUIDED DIAGNOSTIC AND THERAPEUTIC PARACENTESIS MEDICATIONS: 8 mL 1% lidocaine  with epinephrine  COMPLICATIONS: None immediate. PROCEDURE: Informed written consent was obtained from the patient after a discussion of the risks, benefits and alternatives to treatment. A timeout was performed prior to the initiation of the procedure. Initial ultrasound scanning demonstrates a large amount of ascites within the  LEFT lower abdominal quadrant. The left  lower abdomen was prepped and draped in the usual sterile fashion. 1% lidocaine  was used for local anesthesia. Following this, a 6 Fr Safe-T-Centesis catheter was introduced. An ultrasound image was saved for documentation purposes. The paracentesis was performed. The catheter was removed and a dressing was applied. The patient tolerated the procedure well without immediate post procedural complication. FINDINGS: A total of approximately 4.7 L of hazy, blood-tinged fluid was removed. Samples were sent to the laboratory as requested by the clinical team. IMPRESSION: Successful ultrasound-guided diagnostic and therapeutic paracentesis yielding 4.7 L of peritoneal fluid. Performed by: Franky Rakers, PA-C Electronically Signed   By: Thom Hall M.D.   On: 09/24/2023 17:10   CT ABDOMEN PELVIS W CONTRAST Result Date: 09/22/2023 CLINICAL DATA:  Bloating.  Distended abdomen. * Tracking Code: BO * EXAM: CT ABDOMEN AND PELVIS WITH CONTRAST TECHNIQUE: Multidetector CT imaging of the abdomen and pelvis was performed using the standard protocol following bolus administration of intravenous contrast. RADIATION DOSE REDUCTION: This exam was performed according to the departmental dose-optimization program which includes automated exposure control, adjustment of the mA and/or kV according to patient size and/or use of iterative reconstruction technique. CONTRAST:  100mL ISOVUE -300 IOPAMIDOL  (ISOVUE -300) INJECTION 61% COMPARISON:  None Available. FINDINGS: Lower chest: Small focus of atelectasis in the inferior lingula. No pulmonary nodules. Hepatobiliary: No focal hepatic lesion. Low-density gallstone noted. No sickle size. Pancreas: Pancreas is normal. No ductal dilatation. No pancreatic inflammation. Spleen: Normal spleen Adrenals/urinary tract: Adrenal glands and kidneys are normal. The ureters and bladder normal. Stomach/Bowel: Stomach is normal. The small bowel is floating on moderate  volume intraperitoneal free fluid within the small bowel mesentery. No bowel obstruction. Terminal ileum is normal. Appendix not clearly identified. The colon is normal. Contrast travels the entirety of the colon to the rectum. Vascular/Lymphatic: Abdominal aorta is normal caliber. No periportal or retroperitoneal adenopathy. No pelvic adenopathy. Reproductive: Uterus and ovaries are grossly normal. Other: Moderate volume intraperitoneal free fluid. There is extensive nodularity the greater omentum (omental caking, image 58/2 for example). There are peritoneal nodules noted. For example nodule adjacent the RIGHT hepatic lobe measuring 13 mm image 35/2. Peritoneal nodularity in the posterior cul-de-sac on image 84/2. Musculoskeletal: No aggressive osseous lesion. IMPRESSION: 1. 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 2. Moderate volume intraperitoneal free fluid. 3. No clear ovarian or appendiceal neoplasm. No clear primary malignancy identified. These results will be called to the ordering clinician or representative by the Radiologist Assistant, and communication documented in the PACS or [employer]. Electronically Signed   By: Jackquline Boxer M.D.   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(HCC)   10/04/2023 Tumor Marker   Patient's tumor was tested for the following markers: CA-125. Results of the tumor marker test revealed 305.   10/08/2023 Procedure   Successful ultrasound-guided diagnostic and therapeutic paracentesis yielding 3.1 liters of peritoneal fluid.   10/08/2023 Pathology Results   CYTOLOGY - NON PAP  CASE: [id number]  PATIENT: Shemika Villada  Non-Gynecological Cytology Report      Clinical History: None provided  Specimen Submitted:  A.  ASCITES, PARACENTESIS:   FINAL MICROSCOPIC DIAGNOSIS:  - Malignant cells present  - Adenocarcinoma cytomorphologically compatible with serous carcinoma     10/08/2023 Pathology Results   SURGICAL PATHOLOGY  CASE: [id number]  PATIENT: Mirna Degrace  Surgical Pathology Report   Clinical History: None provided   FINAL MICROSCOPIC DIAGNOSIS:   A. PERITONEAL, LLQ, BIOPSY:  - Metastatic carcinoma, consistent with high-grade serous carcinoma (see comment).   Comment: Immunohistochemical stains are performed.  The tumor cells stain positive for WT1 and PAX8 (patchy), with overexpression of p53. Calretinin is negative.  The combined immunomorphologic features are consistent with high-grade serous carcinoma.    10/10/2023 Cancer Staging   Staging form: Ovary, Fallopian Tube, and Primary Peritoneal Carcinoma, AJCC 8th Edition - Clinical: FIGO Stage IIIC (cT3c, cN0, cM0) - Signed by Lonn Hicks, MD on 10/10/2023 Stage prefix: Initial diagnosis   10/14/2023 Procedure   Successful ultrasound-guided therapeutic paracentesis yielding 4.4 L liters of peritoneal fluid   10/16/2023 -  Chemotherapy   Patient is on Treatment Plan : OVARIAN Carboplatin  (AUC 6) + Paclitaxel  (175) q21d X 6 Cycles     10/16/2023 Tumor Marker   Patient's tumor was tested for the following markers: CA-125. Results of the tumor marker test revealed 164.   10/23/2023 Procedure   Successful ultrasound-guided therapeutic paracentesis yielding 1.3 liters of peritoneal fluid.   10/24/2023 Tumor Marker   Patient's tumor was tested for the following markers: CA-125. Results of the tumor marker test revealed 163.   11/07/2023 Tumor Marker   Patient's tumor was tested for the following markers: CA-125. Results of the tumor marker test revealed 185.     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   History of chlamydia    1990   Multiple thyroid  nodules     Prediabetes    Sleep apnea    does not use CPAP due to voiding hourly every night     PAST SURGICAL HISTORY:  Past Surgical History:  Procedure Laterality Date   INTRAUTERINE DEVICE INSERTION  07/18/2008   MIRENA   IR IMAGING GUIDED PORT INSERTION  10/17/2023   IR PARACENTESIS  10/01/2023   IR PARACENTESIS  10/06/2023   IR PARACENTESIS  10/17/2023   IR PARACENTESIS  10/23/2023   SALIVARY GLAND SURGERY     ABSCESS   URETHRAL SLING  1999   WREN    OB/GYN HISTORY:  OB History  Gravida Para Term Preterm AB Living  3 2 2  1 2   SAB IAB Ectopic Multiple Live Births  1    2    # Outcome Date GA Lbr Len/2nd Weight Sex Type Anes PTL Lv  3 SAB           2 Term           1 Term             No LMP recorded. Patient is postmenopausal.  Age at menarche: 63  Age at menopause: 39 Hx of HRT: denies Hx of STDs: yes Last pap: 2025 - NILM, HR HPV negative History of abnormal pap smears: yes  SCREENING STUDIES:  Last mammogram: 2025  Last colonoscopy: 2023  MEDICATIONS: Outpatient Encounter Medications as of 11/07/2023  Medication Sig   dexamethasone  (DECADRON ) 4 MG tablet Take 2 tabs by mouth at the night before and 2 tab the morning of chemotherapy, every 3 weeks, by mouth x 6 cycles   lidocaine -prilocaine  (EMLA ) cream Apply to affected area once   ondansetron  (ZOFRAN ) 8 MG tablet Take 1 tablet (8 mg total) by mouth every 8 (eight) hours as needed for nausea or vomiting.   polyethylene glycol (MIRALAX  / GLYCOLAX ) 17 g packet Take 17  g by mouth daily.   prochlorperazine  (COMPAZINE ) 10 MG tablet Take 1 tablet (10 mg total) by mouth every 6 (six) hours as needed for nausea or vomiting.   [EXPIRED] CARBOplatin  (PARAPLATIN ) 690 mg in sodium chloride  0.9 % 250 mL chemo infusion    [EXPIRED] PACLitaxel  (TAXOL ) 318 mg in sodium chloride  0.9 % 500 mL chemo infusion (> 80mg /m2)    [DISCONTINUED] 0.9 %  sodium chloride  infusion    No facility-administered encounter medications on file as of  11/07/2023.    ALLERGIES:  Allergies  Allergen Reactions   Tree Extract Anaphylaxis, Hives, Swelling and Other (See Comments)    TREE NUTS, Nutmeg, pistachios, macadamias (patient somewhat disagrees with this in 09/2023)   Codeine Nausea And Vomiting   Latex Other (See Comments)    Latex Band-Aids = Caused skin irritation     FAMILY HISTORY:  Family History  Problem Relation Age of Onset   Hypertension Mother    Diabetes Mother    Colon polyps Mother    Colon cancer Mother    Hypertension Father    Cancer Father        lung and brain   Diabetes Father    Other Sister        hypoglycemic   Colon polyps Sister    Depression Brother    Cancer Maternal Grandmother        UTERINE     SOCIAL HISTORY:  Social Connections: Socially Integrated (10/14/2023)   Social Connection and Isolation Panel    Frequency of Communication with Friends and Family: More than three times a week    Frequency of Social Gatherings with Friends and Family: More than three times a week    Attends Religious Services: More than 4 times per year    Active Member of Clubs or Organizations: Yes    Attends Banker Meetings: 1 to 4 times per year    Marital Status: Married    REVIEW OF SYSTEMS:  Denies appetite changes, fevers, chills, fatigue, unexplained weight changes. Denies hearing loss, neck lumps or masses, mouth sores, ringing in ears or voice changes. Denies cough or wheezing.  Denies shortness of breath. Denies chest pain or palpitations. Denies leg swelling. Denies abdominal distention, pain, blood in stools, constipation, diarrhea, nausea, vomiting, or early satiety. Denies pain with intercourse, dysuria, frequency, hematuria or incontinence. Denies hot flashes, pelvic pain, vaginal bleeding or vaginal discharge.   Denies joint pain, back pain or muscle pain/cramps. Denies itching, rash, or wounds. Denies dizziness, headaches, numbness or seizures. Denies swollen lymph nodes  or glands, denies easy bruising or bleeding. Denies anxiety, depression, confusion, or decreased concentration.  Physical Exam:  Vital Signs for this encounter:  Blood pressure (!) 142/76, pulse 92, temperature 98.9 F (37.2 C), temperature source Oral, resp. rate 19, height 5' 2 (1.575 m), weight 168 lb 9.6 oz (76.5 kg), SpO2 98%. Body mass index is 30.84 kg/m. General: Alert, oriented, no acute distress.  HEENT: Normocephalic, atraumatic. Sclera anicteric.  Chest: Clear to auscultation bilaterally. No wheezes, rhonchi, or rales. Cardiovascular: Regular rate and rhythm, no murmurs, rubs, or gallops.  Abdomen: Normoactive bowel sounds. Soft, mildly distended, nontender to palpation. No masses or hepatosplenomegaly appreciated. + palpable fluid wave.  Extremities: Grossly normal range of motion. Warm, well perfused. No edema bilaterally.  Skin: No rashes or lesions.  Lymphatics: No cervical, supraclavicular adenopathy.  GU: Deferred.  LABORATORY AND RADIOLOGIC DATA:  Outside medical records were reviewed to synthesize the above history, along  with the history and physical obtained during the visit.   Lab Results  Component Value Date   WBC 7.7 11/06/2023   HGB 10.2 (L) 11/06/2023   HCT 32.7 (L) 11/06/2023   PLT 279 11/06/2023   GLUCOSE 146 (H) 11/06/2023   CHOL 179 12/09/2014   TRIG 93 12/09/2014   HDL 54 12/09/2014   LDLCALC 106 12/09/2014   ALT 21 11/06/2023   AST 18 11/06/2023   NA 140 11/06/2023   K 3.9 11/06/2023   CL 102 11/06/2023   CREATININE 0.49 11/06/2023   BUN 18 11/06/2023   CO2 32 11/06/2023   TSH 1.63 12/12/2015   INR 1.2 10/15/2023   HGBA1C 5.8 (H) 02/18/2018

## 2023-11-07 NOTE — Patient Instructions (Addendum)
 Plan on continued care with Dr. Lonn.   We will plan on seeing you after completion of 3 cycles of chemotherapy and after repeat imaging.   Dr. Viktoria is recommending referral to genetics.   We will also place a referral for you to meet with the Urogynecologist. You should receive a phone call from their office.

## 2023-11-07 NOTE — Progress Notes (Signed)
 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 Patient states that at the beginning of July 2025, she began having stomach problems which included bloating, loose stools, and abdominal distention. She endorsed having heartburn after eating, and during this same time she began to have leg swelling. Patient believed that she was having a hiatal hernia so went to her PCP to be seen. CT scan was done and revealed nodularity of her omentum and peritoneum. A CA 125 was also drawn and was elevated, and a couple of days later a paracentesis was performed, which revealed high grade serous carcinoma of unspecified origin. 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 She received second round of chemo yesterday (11/06/2023). Today, patient endorses that her symptoms have overall improved. Her abdominal distention has decreased along with her abdominal pain. Appetite has been normal. States that she will get occasional cramps and heartburn in the epigastric area after eating, but pain responds will to Tums. Pressing on epigastric area causes mild pain, but is tolerable. Patient is experiencing less bloating and has not had any trouble with bowel movements such as constipation or diarrhea, currently takes Mirilax and has been regular. Reports occasional nausea but is controled with Zofran . Patient denies any vaginal bleeding and urinary symptoms.   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 Patient has received a total of  nine paracentesis, and has had three cycles of chemotherapy. Electrolytes normal, and CA 125 has been trending down, however there has been a slight increase in the level from 163 two weeks ago to 185 today. Patient reports resolving of symptoms such as abdominal distention, bloating, pain, and diarrhea.   Plan:  - continue third cycle of chemo on 11/10/2023 - will receive CT scan after third cycle of chemo, which will determine if debulking surgery vs continuing chemo is appropriate at the time    Family History of Colon, Lung, Brain Cancer   Patient has extensive family history of cancer. Sent referral to genetics for germline testing.   History of Pelvic Organ Prolapse History of Stress Incontinence   Patient reports improvement of stress incontinence symptoms for the past several days, but still usually wears a pad when going out. Reports experience of incontinence when laughing, coughing, and lifting. Patient reports prolapse of her cervix and endorsed dyspareunia during her last sexual encounter. Has had pessary for symptoms before, but reported that it kept falling out and causing bleeding, which prompted her to stop using it. Has had sling put in over 20 years ago.  Plan:  - referral to urogyn for prolapse and stress incontinence symptoms

## 2023-11-07 NOTE — Telephone Encounter (Signed)
 Effectiveness of Out-of-Pocket Psychologist, forensic ([employer]) in Cancer Patients   The research nurse spoke to the pt about the above study.  The pt said that she still has the consent form and will read over it with her husband before her next treatment appointment on 11/27/23.  The pt was given the nurse's direct phone number and encouraged to call the research nurse if she has any questions/concerns about study participation.  The pt informed the nurse that her husband filed for social security disability on 10/20/23 for her.  The nurse informed the pt that we have 120 days from her diagnosis to get her enrolled into the study if she wants to participate.  The pt was thanked for her continued interest in the study. The nurse will follow up with the pt at her next treatment appt to see if she has any questions/concerns about the study.  Levon FREDRIK Sandifer RN, BSN, CCRP Clinical Research Nurse Lead 11/07/2023 3:53 PM

## 2023-11-10 ENCOUNTER — Inpatient Hospital Stay

## 2023-11-10 ENCOUNTER — Inpatient Hospital Stay: Admitting: Genetic Counselor

## 2023-11-10 ENCOUNTER — Encounter: Payer: Self-pay | Admitting: Oncology

## 2023-11-10 ENCOUNTER — Encounter: Payer: Self-pay | Admitting: Genetic Counselor

## 2023-11-10 DIAGNOSIS — Z803 Family history of malignant neoplasm of breast: Secondary | ICD-10-CM

## 2023-11-10 DIAGNOSIS — Z8049 Family history of malignant neoplasm of other genital organs: Secondary | ICD-10-CM

## 2023-11-10 DIAGNOSIS — C569 Malignant neoplasm of unspecified ovary: Secondary | ICD-10-CM

## 2023-11-10 DIAGNOSIS — Z800 Family history of malignant neoplasm of digestive organs: Secondary | ICD-10-CM | POA: Diagnosis not present

## 2023-11-10 LAB — GENETIC SCREENING ORDER

## 2023-11-10 NOTE — Progress Notes (Signed)
 Order requisition sent to [facility] on accession [id number] per Dr. Viktoria.

## 2023-11-10 NOTE — Progress Notes (Signed)
 REFERRING PROVIDER: Viktoria Comer SAUNDERS, MD   PRIMARY PROVIDER:  [name], FNP  PRIMARY REASON FOR VISIT:  1. Malignant neoplasm of ovary, unspecified laterality (HCC)   2. Family history of malignant neoplasm of breast   3. Family history of malignant neoplasm of gastrointestinal tract   4. Family history of malignant neoplasm of genital organ      HISTORY OF PRESENT ILLNESS:   Miranda Hill, a 58 y.o. female, was seen for a [facility] cancer genetics consultation at the request of Dr. Viktoria due to a personal and family history of cancer.  Miranda Hill presents to clinic today to discuss the possibility of a hereditary predisposition to cancer, to discuss genetic testing, and to further clarify her future cancer risks, as well as potential cancer risks for family members.   In 2025, at the age of 67, Miranda Hill was diagnosed with ovarian cancer. She is currently being treated with chemotherapy.    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 Request for therapeutic paracentesis. EXAM: ULTRASOUND GUIDED THERAPEUTIC PARACENTESIS MEDICATIONS: 6 mL 1% lidocaine  COMPLICATIONS: None immediate. PROCEDURE: Informed written consent was obtained from the patient after a discussion of the risks, benefits and alternatives to treatment. A timeout was performed prior to the initiation of the procedure. Initial ultrasound scanning demonstrates a large amount of ascites within the right lower abdominal quadrant. The right lower abdomen was prepped and draped in the usual sterile fashion. 1% lidocaine  was used for local anesthesia. Following this, a 19 gauge, 7-cm, Yueh catheter was introduced. An ultrasound  image was saved for documentation purposes. The paracentesis was performed. The catheter was removed and a dressing was applied. The patient tolerated the procedure well without immediate post procedural complication. FINDINGS: A total of approximately 3.2 L of clear, amber fluid was removed. IMPRESSION: Successful ultrasound-guided paracentesis yielding 3.2 liters of peritoneal fluid. Performed by Clotilda Hesselbach, PA-C Electronically Signed   By: Ester Sides M.D.   On: 10/01/2023 12:46   NM PET Image Initial (PI) Skull Base To Thigh (F-18 FDG) Result Date: 09/25/2023 CLINICAL DATA:  Initial treatment strategy for omental and peritoneal nodularity compatible with carcinomatosis. EXAM: NUCLEAR MEDICINE PET SKULL BASE TO THIGH TECHNIQUE: 9.4 mCi F-18 FDG was injected intravenously. Full-ring PET imaging was performed from the skull base to thigh after the radiotracer. CT data was obtained and used for attenuation correction and anatomic localization. Fasting blood glucose: 116 mg/dl COMPARISON:  CT scan 1/74/7974 FINDINGS: Mediastinal blood pool activity: SUV max 2.4 Liver activity: SUV max NA NECK: No significant abnormal hypermetabolic activity in this region. Incidental CT findings: None. CHEST: Small type 1 hiatal hernia, accompanied by a small amount of ascites, faint metabolic activity in the ascites with maximum SUV 2.3, cannot exclude malignant ascites extension up through the hiatus. Incidental CT findings: Mild scarring or subsegmental atelectasis anteriorly in the right upper lobe. ABDOMEN/PELVIS: Heavy burden of hypermetabolic omental caking with malignant ascites and numerous foci of hypermetabolic activity along the liver capsule, right paracolic gutter, left paracolic gutter, mesentery, and pelvic ascites. A representative region of the omental caking in the left lower quadrant anterior to the descending colon a maximum SUV of 14.6. A hypermetabolic tumor deposit in  the pelvic ascites anterior to  the rectum on image 171 series 4 has maximum SUV of 16.0 and measures 2.7 cm in long axis. Hypermetabolic focus along the posterosuperior liver margin maximum SUV 7.8. Isolation of primary site is problematic given the tumor deposits along the cecum and expected location of the appendix as well as the adnexa. Overall moderate amount of malignant ascites. Incidental CT findings: None. SKELETON: No significant abnormal hypermetabolic activity in this region. Incidental CT findings: None. IMPRESSION: 1. Heavy burden of hypermetabolic omental caking with malignant ascites and numerous foci of hypermetabolic activity along the liver capsule, right paracolic gutter, left paracolic gutter, mesentery, and pelvic ascites. Isolation of primary site is problematic given the tumor deposits along the cecum and expected location of the appendix as well as the adnexa. Overall moderate amount of malignant ascites. 2. Small type 1 hiatal hernia, accompanied by a small amount of ascites, faint metabolic activity in the ascites with maximum SUV 2.3, cannot exclude malignant ascites extension up through the hiatus. Electronically Signed   By: Ryan Salvage M.D.   On: 09/25/2023 16:59   US  Paracentesis Result Date: 09/24/2023 INDICATION: other ascites Patient with history of bloating, abdominal distension, imaging findings of extensive omental and peritoneal nodularity, ascites; request received for diagnostic and therapeutic paracentesis. EXAM: ULTRASOUND GUIDED DIAGNOSTIC AND THERAPEUTIC PARACENTESIS MEDICATIONS: 8 mL 1% lidocaine  with epinephrine  COMPLICATIONS: None immediate. PROCEDURE: Informed written consent was obtained from the patient after a discussion of the risks, benefits and alternatives to treatment. A timeout was performed prior to the initiation of the procedure. Initial ultrasound scanning demonstrates a large amount of ascites within the LEFT lower abdominal quadrant. The left lower abdomen was prepped and  draped in the usual sterile fashion. 1% lidocaine  was used for local anesthesia. Following this, a 6 Fr Safe-T-Centesis catheter was introduced. An ultrasound image was saved for documentation purposes. The paracentesis was performed. The catheter was removed and a dressing was applied. The patient tolerated the procedure well without immediate post procedural complication. FINDINGS: A total of approximately 4.7 L of hazy, blood-tinged fluid was removed. Samples were sent to the laboratory as requested by the clinical team. IMPRESSION: Successful ultrasound-guided diagnostic and therapeutic paracentesis yielding 4.7 L of peritoneal fluid. Performed by: Franky Rakers, PA-C Electronically Signed   By: Thom Hall M.D.   On: 09/24/2023 17:10   CT ABDOMEN PELVIS W CONTRAST Result Date: 09/22/2023 CLINICAL DATA:  Bloating.  Distended abdomen. * Tracking Code: BO * EXAM: CT ABDOMEN AND PELVIS WITH CONTRAST TECHNIQUE: Multidetector CT imaging of the abdomen and pelvis was performed using the standard protocol following bolus administration of intravenous contrast. RADIATION DOSE REDUCTION: This exam was performed according to the departmental dose-optimization program which includes automated exposure control, adjustment of the mA and/or kV according to patient size and/or use of iterative reconstruction technique. CONTRAST:  100mL ISOVUE -300 IOPAMIDOL  (ISOVUE -300) INJECTION 61% COMPARISON:  None Available. FINDINGS: Lower chest: Small focus of atelectasis in the inferior lingula. No pulmonary nodules. Hepatobiliary: No focal hepatic lesion. Low-density gallstone noted. No sickle size. Pancreas: Pancreas is normal. No ductal dilatation. No pancreatic inflammation. Spleen: Normal spleen Adrenals/urinary tract: Adrenal glands and kidneys are normal. The ureters and bladder normal. Stomach/Bowel: Stomach is normal. The small bowel is floating on moderate volume intraperitoneal free fluid within the small bowel mesentery.  No bowel obstruction. Terminal ileum is normal. Appendix not clearly identified. The colon is normal. Contrast travels the entirety of the colon to the rectum. Vascular/Lymphatic: Abdominal aorta  is normal caliber. No periportal or retroperitoneal adenopathy. No pelvic adenopathy. Reproductive: Uterus and ovaries are grossly normal. Other: Moderate volume intraperitoneal free fluid. There is extensive nodularity the greater omentum (omental caking, image 58/2 for example). There are peritoneal nodules noted. For example nodule adjacent the RIGHT hepatic lobe measuring 13 mm image 35/2. Peritoneal nodularity in the posterior cul-de-sac on image 84/2. Musculoskeletal: No aggressive osseous lesion. IMPRESSION: 1. 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 2. Moderate volume intraperitoneal free fluid. 3. No clear ovarian or appendiceal neoplasm. No clear primary malignancy identified. These results will be called to the ordering clinician or representative by the Radiologist Assistant, and communication documented in the PACS or [employer]. Electronically Signed   By: Jackquline Boxer M.D.   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(HCC)   10/04/2023 Tumor Marker   Patient's tumor was tested for the following markers: CA-125. Results of the tumor marker test revealed 305.   10/08/2023 Procedure   Successful ultrasound-guided diagnostic and therapeutic paracentesis yielding 3.1 liters of peritoneal fluid.   10/08/2023 Pathology Results   CYTOLOGY - NON PAP  CASE: [id number]  PATIENT: Shavelle Meuth  Non-Gynecological Cytology Report      Clinical History: None provided  Specimen Submitted:  A. 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 PERITONEAL, LLQ, BIOPSY:  - Metastatic carcinoma, consistent with high-grade serous carcinoma (see comment).   Comment: Immunohistochemical stains are performed.  The tumor cells stain positive for WT1 and PAX8 (patchy), with overexpression of p53. Calretinin is negative.  The combined immunomorphologic features are consistent with high-grade serous carcinoma.    10/10/2023 Cancer Staging   Staging form: Ovary, Fallopian Tube, and Primary Peritoneal Carcinoma, AJCC 8th Edition - Clinical: FIGO Stage IIIC (cT3c, cN0, cM0) - Signed by Lonn Hicks, MD on 10/10/2023 Stage prefix: Initial diagnosis   10/14/2023 Procedure   Successful ultrasound-guided therapeutic paracentesis yielding 4.4 L liters of peritoneal fluid   10/16/2023 -  Chemotherapy   Patient is on Treatment Plan : OVARIAN Carboplatin  (AUC 6) + Paclitaxel  (175) q21d X 6 Cycles     10/16/2023 Tumor Marker   Patient's tumor was tested for the following markers: CA-125. Results of the tumor marker test revealed 164.   10/23/2023 Procedure   Successful ultrasound-guided therapeutic paracentesis yielding 1.3 liters of peritoneal fluid.   10/24/2023 Tumor Marker   Patient's tumor was tested for the following markers: CA-125. Results of the tumor marker test revealed 163.   11/07/2023 Tumor Marker   Patient's tumor was tested for the following markers: CA-125. Results of the tumor marker test revealed 185.     RISK FACTORS:  Menarche was at age 29.  First live birth at age 84.  Ovaries intact: yes.  Uterus intact: yes.  Menopausal status: postmenopausal. At age 59.  Colonoscopy: yes; reports history of two colonoscopies with approximately 3  precancerous polyps. Next scheduled 2026. Mammogram within the last year: yes. Density category B Number of breast biopsies: 0.  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   History of chlamydia    1990   Multiple thyroid  nodules    Prediabetes    Sleep apnea    does not use CPAP due to voiding hourly every night    Past Surgical History:  Procedure Laterality Date   INTRAUTERINE DEVICE INSERTION  07/18/2008   MIRENA   IR IMAGING GUIDED PORT INSERTION  10/17/2023   IR PARACENTESIS  10/01/2023   IR PARACENTESIS  10/06/2023   IR PARACENTESIS  10/17/2023   IR PARACENTESIS  10/23/2023   SALIVARY GLAND SURGERY     ABSCESS   URETHRAL SLING  1999   WREN    Social History   Socioeconomic History   Marital status: Married    Spouse name: Not on file   Number of children: Not on file   Years of education: Not on file   Highest education level: Not on file  Occupational History   Not on file  Tobacco Use   Smoking status: Never   Smokeless tobacco: Never  Vaping Use   Vaping status: Never Used  Substance and Sexual Activity   Alcohol use: Not Currently   Drug use: No   Sexual activity: Yes    Birth control/protection: Post-menopausal    Comment: husband with vasectomy  Other Topics Concern   Not on file  Social History Narrative   Not on file   Social Drivers of Health   Financial Resource Strain: Not on file  Food Insecurity: No Food Insecurity (10/14/2023)   Hunger Vital Sign    Worried About Running Out of Food in the Last Year: Never true    Ran Out of Food in the Last Year: Never true  Transportation Needs: No Transportation Needs (10/14/2023)   PRAPARE - Administrator, Civil Service (Medical): No    Lack of Transportation (Non-Medical): No  Physical Activity: Not on file  Stress: Not on file  Social Connections: Socially Integrated (10/14/2023)   Social Connection and Isolation Panel    Frequency of  Communication with Friends and Family: More than three times a week    Frequency of Social Gatherings with Friends and Family: More than three times a week    Attends Religious Services: More than 4 times per year    Active Member of [employer] or Organizations: Yes    Attends Banker Meetings: 1 to 4 times per year    Marital Status: Married     FAMILY HISTORY:  We obtained a detailed, 4-generation family history.  Significant diagnoses are listed below: Family History  Problem Relation Age of Onset   Colon cancer Mother 5   Hypertension Mother    Diabetes Mother    Hypertension Father    Diabetes Father    Lung cancer Father 60 - 35       brain mets   Other Sister        hypoglycemic   Colon polyps Sister    Depression Brother    Skin cancer Maternal Uncle    Breast cancer Paternal Aunt        dx >50   Uterine cancer Maternal Grandmother        dx>50 y.o.   Autism spectrum disorder Son      Miranda Hill is unaware of previous family history of genetic testing for hereditary cancer risks. Patient's maternal ancestors are of [place] descent, and paternal ancestors are of [place] descent. There is reported Ashkenazi Jewish ancestry.  GENETIC COUNSELING ASSESSMENT: Miranda Hill is a 58 y.o. female with a personal and family history of cancer which is somewhat suggestive of a hereditary predisposition to cancer given her personal history of ovarian cancer and family history of close relatives with breast cancer. We, therefore, discussed and recommended the following at today's visit.   DISCUSSION: We discussed that 5 - 10% of cancer is hereditary, with most cases of ovarian cancer associated with BRCA1/2 pathogenic variants.  There are other genes that can be associated with hereditary ovarian cancer syndromes.  We discussed that testing is beneficial for several reasons including knowing how to follow individuals after completing their treatment, identifying whether potential  treatment options would be beneficial, and understanding if other family members could be at risk for cancer and allowing them to undergo genetic testing.   We reviewed the characteristics, features and inheritance patterns of hereditary cancer syndromes. We also discussed genetic testing, including the appropriate family members to test, the process of testing, insurance coverage and turn-around-time for results. We discussed the implications of a negative, positive, carrier and/or variant of uncertain significant result. We recommended Miranda Hill pursue genetic testing for a panel that includes genes associated with ovarian, breast, and colon cancer.   Miranda Hill  was offered a common hereditary cancer panel (48 genes) and an expanded pan-cancer panel (70 genes). Miranda Hill was informed of the benefits and limitations of each panel, including that expanded pan-cancer panels contain genes that do not have clear management guidelines at this point in time.  We also discussed that as the number of genes included on a panel increases, the chances of variants of uncertain significance increases. After considering the benefits and limitations of each gene panel, Miranda Hill elected to have [facility]'s Common Hereditary Cancer panel +RNA. The [facility] Common Hereditary Cancers panel includes analysis of the following 48 genes: APC, ATM, AXIN2, BAP1, BARD1, BMPR1A, BRCA1, BRCA2, BRIP1, CDH1, CDK4, CDKN2A, CHEK2, CTNNA1, DICER1, EPCAM, FH, GREM1, HOXB13, KIT, MBD4, MEN1, MLH1, MSH2, MSH3, MSH6, MUTYH, NF1, NTHL1, PALB2, PDGFRA, PMS2, POLD1, POLE, PTEN, RAD51C, RAD51D, SDHA, SDHB, SDHC, SDHD, SMAD4, SMARCA4, STK11, TP53, TSC1, TSC2, VHL.   Based on Miranda Hill's personal and family history of cancer, she meets medical criteria for genetic testing. Despite that she meets criteria, she may still have an out of pocket cost. We discussed that if her out of pocket cost for testing is over $100, the laboratory should contact them to  discuss self-pay prices, patient pay assistance programs, if applicable, and other billing options.  We discussed that some people do not want to undergo genetic testing due to fear of genetic discrimination.  A federal law called the Genetic Information Non-Discrimination Act (GINA) of 2008 helps protect individuals against genetic discrimination based on their genetic test results.  It impacts both health insurance and employment.  With health insurance, it protects against increased premiums, being kicked off insurance or being forced to take a test in order to be insured.  For employment it protects against hiring, firing and promoting decisions based on genetic test results.  GINA does not apply to those in the [employer], those who work for companies with less than 15 employees, and new life insurance or long-term disability insurance policies.  Health status due to a cancer diagnosis is not protected under GINA.  PLAN: After considering the risks, benefits, and limitations, Miranda Hill provided informed consent to pursue genetic testing and the blood sample was sent to [facility] for analysis of  the Common Hereditary Cancers panel +RNA. Results should be available within approximately 2-3 weeks' time, at which point they will be disclosed by telephone to Miranda Hill, as will any additional recommendations warranted by these results. Miranda Hill will receive a summary of her genetic counseling visit and a copy of her results once available. This information will also be available in Epic.   Lastly, we encouraged Miranda Hill to remain in contact with cancer genetics annually so that we can continuously update the family history and inform her of any changes in cancer genetics and testing that may be of benefit for this family.   Miranda Hill questions were answered to her satisfaction today. Our contact information was provided should additional questions or concerns arise. Thank you for the referral and  allowing us  to share in the care of your patient.   Burnard Ogren, MS, [facility] Licensed, Retail [email or web address] phone: [phone]   50 minutes were spent on the date of the encounter in service to the patient including preparation, face-to-face consultation, documentation and care coordination.  The patient brought her spouse.  Drs Odean and/or Lanny were available to discuss this case as needed.   _______________________________________________________________________ For Office Staff:  Number of people involved in session: 2 Was an Intern/ student involved with case: yes. [employer] GC student, [place], observed this visit with patient permission.

## 2023-11-11 ENCOUNTER — Other Ambulatory Visit: Payer: Self-pay

## 2023-11-13 ENCOUNTER — Ambulatory Visit: Admitting: Gynecologic Oncology

## 2023-11-17 ENCOUNTER — Telehealth: Payer: Self-pay

## 2023-11-17 ENCOUNTER — Inpatient Hospital Stay

## 2023-11-17 NOTE — Telephone Encounter (Signed)
 Returned her call and spoke with husband/ Rosaline. Asking what stage cancer to help with applying for disability/ social security.

## 2023-11-19 ENCOUNTER — Inpatient Hospital Stay: Admitting: Licensed Clinical Social Worker

## 2023-11-19 NOTE — Progress Notes (Signed)
 [facility] CSW Progress Note  Clinical Child psychotherapist contacted patient by phone to follow-up on insurance questions. Spoke with patient and then her husband.    Interventions: Provided patient with information about [facility] [employer] and how to schedule to look at eligibility for Medicaid vs Marketplace plans. Discussed what information to give to ensure they select a plan that covers pt's needs       Follow Up Plan:  Patient will contact CSW with any support or resource needs    Kitti Mcclish E Stelios Kirby, LCSW Clinical Social Worker [facility]

## 2023-11-20 ENCOUNTER — Ambulatory Visit: Admitting: Obstetrics

## 2023-11-20 ENCOUNTER — Other Ambulatory Visit (HOSPITAL_COMMUNITY): Payer: Self-pay

## 2023-11-20 ENCOUNTER — Encounter: Payer: Self-pay | Admitting: Obstetrics

## 2023-11-20 ENCOUNTER — Telehealth: Payer: Self-pay | Admitting: Oncology

## 2023-11-20 VITALS — BP 149/84 | HR 96 | Wt 168.0 lb

## 2023-11-20 DIAGNOSIS — N814 Uterovaginal prolapse, unspecified: Secondary | ICD-10-CM

## 2023-11-20 DIAGNOSIS — Z9889 Other specified postprocedural states: Secondary | ICD-10-CM | POA: Diagnosis not present

## 2023-11-20 DIAGNOSIS — R350 Frequency of micturition: Secondary | ICD-10-CM

## 2023-11-20 DIAGNOSIS — R351 Nocturia: Secondary | ICD-10-CM | POA: Diagnosis not present

## 2023-11-20 DIAGNOSIS — K5909 Other constipation: Secondary | ICD-10-CM | POA: Diagnosis not present

## 2023-11-20 DIAGNOSIS — N8110 Cystocele, unspecified: Secondary | ICD-10-CM | POA: Insufficient documentation

## 2023-11-20 DIAGNOSIS — N3946 Mixed incontinence: Secondary | ICD-10-CM

## 2023-11-20 LAB — POCT URINALYSIS DIP (CLINITEK)
Bilirubin, UA: NEGATIVE
Blood, UA: NEGATIVE
Glucose, UA: NEGATIVE mg/dL
Ketones, POC UA: NEGATIVE mg/dL
Leukocytes, UA: NEGATIVE
Nitrite, UA: NEGATIVE
POC PROTEIN,UA: NEGATIVE
Spec Grav, UA: 1.02 (ref 1.010–1.025)
Urobilinogen, UA: 0.2 U/dL
pH, UA: 6.5 (ref 5.0–8.0)

## 2023-11-20 MED ORDER — TROSPIUM CHLORIDE ER 60 MG PO CP24
1.0000 | ORAL_CAPSULE | Freq: Every day | ORAL | 2 refills | Status: DC
  Filled 2023-11-20: qty 30, 30d supply, fill #0
  Filled 2023-12-17: qty 30, 30d supply, fill #1
  Filled 2024-01-31 – 2024-02-06 (×3): qty 30, 30d supply, fill #2

## 2023-11-20 NOTE — Assessment & Plan Note (Addendum)
-   avoid fluid intake 3 hours before bedtime - elevated feet during the day or use compression socks to reduce lower extremity swelling - resume CPAP for sleep apnea - trial of Trospium at bedtime

## 2023-11-20 NOTE — Patient Instructions (Addendum)
 Please present to urodynamics with a full bladder.   We discussed the symptoms of overactive bladder (OAB), which include urinary urgency, urinary frequency, night-time urination, with or without urge incontinence.  We discussed management including behavioral therapy (decreasing bladder irritants by following a bladder diet, urge suppression strategies, timed voids, bladder retraining), physical therapy, medication; and for refractory cases posterior tibial nerve stimulation, sacral neuromodulation, and intravesical botulinum toxin injection.   For anticholinergic medications, we discussed the potential side effects of anticholinergics including dry eyes, dry mouth, constipation, rare risks of cognitive impairment and urinary retention. You were given prescription for Trospium 60mg  daily after urodynamics.  It can take a month to start working so give it time, but if you have bothersome side effects call sooner and we can try a different medication.  Call us  if you have trouble filling the prescription or if it's not covered by your insurance.  For treatment of stress urinary incontinence, which is leakage with physical activity/movement/strainging/coughing, we discussed expectant management versus nonsurgical options versus surgery. Nonsurgical options include weight loss, physical therapy, as well as a pessary.  Surgical options include a midurethral sling, which is a synthetic mesh sling that acts like a hammock under the urethra to prevent leakage of urine, a Burch urethropexy, and transurethral injection of a bulking agent.   For night time frequency: - avoid fluid intake 3 hours before bedtime - elevated your feet during the day or use compression socks to reduce lower extremity swelling - resume CPAP use for sleep apnea  You have a stage 2 (out of 4) prolapse.  We discussed the fact that it is not life threatening but there are several treatment options. For treatment of pelvic organ prolapse,  we discussed options for management including expectant management, conservative management, and surgical management, such as Kegels, a pessary, pelvic floor physical therapy, and specific surgical procedures.     We discussed two options for prolapse repair:  1) vaginal repair without mesh - Pros - safer, no mesh complications - Cons - not as strong as mesh repair, higher risk of recurrence  2) laparoscopic repair with mesh - Pros - stronger, better long-term success - Cons - risks of mesh implant (erosion into vagina or bladder, adhering to the rectum, pain) - these risks are lower than with a vaginal mesh but still exist  Continue miralax  daily for stool consistency.

## 2023-11-20 NOTE — Assessment & Plan Note (Signed)
-   For constipation, we reviewed the importance of a better bowel regimen.  We also discussed the importance of avoiding chronic straining, as it can exacerbate her pelvic floor symptoms; we discussed treating constipation and straining prior to surgery, as postoperative straining can lead to damage to the repair and recurrence of symptoms. We discussed initiating therapy with increasing fluid intake, fiber supplementation, stool softeners, and laxatives such as miralax .  - continue miralax  and possible need for titration postop

## 2023-11-20 NOTE — Assessment & Plan Note (Signed)
-   For treatment of pelvic organ prolapse, we discussed options for management including expectant management, conservative management, and surgical management, such as Kegels, a pessary, pelvic floor physical therapy, and specific surgical procedures. - We discussed two options for prolapse repair:  1) vaginal repair without mesh - Pros - safer, no mesh complications - Cons - not as strong as mesh repair, higher risk of recurrence  2) laparoscopic repair with mesh - Pros - stronger, better long-term success - Cons - risks of mesh implant (erosion into vagina or bladder, adhering to the rectum, pain) - these risks are lower than with a vaginal mesh but still exist - consider laparoscopic uterosacral ligament suspension vs. Vaginal sacrospinous ligament suspension, anterior/posterior repair at the time of surgery. Discussed concerns regarding possible bowel surgery and may have to determine route pending intraoperative findings. Encouraged pt to review options and will discuss at follow-up.
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
## 2023-11-20 NOTE — Telephone Encounter (Signed)
 Left a message with appointment to see Dr. Viktoria on 12/11/23 at 8:00 to discuss surgery.  Requested a return call to confirm.
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
## 2023-11-20 NOTE — Telephone Encounter (Signed)
 Aunesti called back and confirmed the appointment on 12/11/23 with Dr. Viktoria.

## 2023-11-20 NOTE — Assessment & Plan Note (Addendum)
-   history of midurethral sling by Dr. Brunetta in 1999 complicated by urinary retention with CIC x 1 week, UDS in 2024 with donut pessary placement. ROI from [facility] to review op report, testing and pessary placement. Denies resolution of leakage after surgery with position change required for voids since surgery  - patient reports inability to void at prior UDS, scheduled repeat to assess appropriate intervention due to possible pending surgery with Dr. Viktoria

## 2023-11-21 ENCOUNTER — Encounter: Payer: Self-pay | Admitting: Obstetrics and Gynecology

## 2023-11-21 ENCOUNTER — Ambulatory Visit (INDEPENDENT_AMBULATORY_CARE_PROVIDER_SITE_OTHER): Admitting: Obstetrics and Gynecology

## 2023-11-21 VITALS — BP 132/89 | HR 96

## 2023-11-21 DIAGNOSIS — Z9889 Other specified postprocedural states: Secondary | ICD-10-CM

## 2023-11-21 DIAGNOSIS — N814 Uterovaginal prolapse, unspecified: Secondary | ICD-10-CM

## 2023-11-21 DIAGNOSIS — R948 Abnormal results of function studies of other organs and systems: Secondary | ICD-10-CM | POA: Diagnosis not present

## 2023-11-21 DIAGNOSIS — R350 Frequency of micturition: Secondary | ICD-10-CM

## 2023-11-21 DIAGNOSIS — N3281 Overactive bladder: Secondary | ICD-10-CM

## 2023-11-21 DIAGNOSIS — N3946 Mixed incontinence: Secondary | ICD-10-CM

## 2023-11-21 LAB — POCT URINALYSIS DIP (CLINITEK)
Bilirubin, UA: NEGATIVE
Blood, UA: NEGATIVE
Glucose, UA: NEGATIVE mg/dL
Ketones, POC UA: NEGATIVE mg/dL
Leukocytes, UA: NEGATIVE
Nitrite, UA: NEGATIVE
POC PROTEIN,UA: NEGATIVE
Spec Grav, UA: 1.015 (ref 1.010–1.025)
Urobilinogen, UA: 0.2 U/dL
pH, UA: 7 (ref 5.0–8.0)

## 2023-11-21 NOTE — Progress Notes (Signed)
 [facility] Urodynamics Procedure  Referring Physician: [name], FNP Date of Procedure: 11/21/2023  Miranda Hill is a 58 y.o. female who presents for urodynamic evaluation. Indication(s) for study: Previous sling with possible obstruction.   Vital Signs: BP 132/89   Pulse 96   Laboratory Results: A catheterized urine specimen revealed:  POC urine:  Lab Results  Component Value Date   COLORU yellow 11/21/2023   CLARITYU clear 11/21/2023   GLUCOSEUR negative 11/21/2023   BILIRUBINUR negative 11/21/2023   SPECGRAV 1.015 11/21/2023   RBCUR negative 11/21/2023   PHUR 7.0 11/21/2023   PROTEINUR NEGATIVE 10/14/2023   UROBILINOGEN 0.2 11/21/2023   LEUKOCYTESUR Negative 11/21/2023     Voiding Diary: Deferred  Procedure Timeout:  The correct patient was verified and the correct procedure was verified. The patient was in the correct position and safety precautions were reviewed based on at the patient's history.  Urodynamic Procedure A 49F dual lumen urodynamics catheter was placed under sterile conditions into the patient's bladder. A 49F catheter was placed into the rectum in order to measure abdominal pressure. EMG patches were placed in the appropriate position.  All connections were confirmed and calibrations/adjusted made. Saline was instilled into the bladder through the dual lumen catheters.  Cough/valsalva pressures were measured periodically during filling.  Patient was allowed to void.  The bladder was then emptied of its residual.  UROFLOW: Revealed a Qmax of 18 mL/sec.  She voided 215 mL and had a residual of 175 mL.  It was a interrupted pattern and represented normal habits.  CMG: This was performed with sterile water in the sitting position at a fill rate of 30 mL/min.    First sensation of fullness was 181 mLs,  First urge was 192 mLs,  Strong urge was 300 mLs and  Capacity was 515 mLs  Stress incontinence was not demonstrated Highest  negative Barrier CLPP was 126 cmH20 at 200 ml. Highest negative Barrier VLPP was 136 cmH20 at 345 ml. While standing.   Detrusor function was overactive, with phasic contractions seen.  The first occurred at 110 mL to 4 cm of water and was not associated with urge.  Compliance:  Low. End fill detrusor pressure was 23cmH20.  Calculated compliance was 88mL/cmH20  UPP: MUCP with barrier reduction was 75 cm of water.    MICTURITION STUDY: Voiding was performed with reduction using scopettes in the sitting position.  Pdet at Qmax was 42 cm of water.  Qmax was 6 mL/sec.  It was a interrupted pattern.  She voided 235 mL and had a residual of 280 mL.  It was a volitional void, sustained detrusor contraction was not present and abdominal straining was present  EMG: This was performed with patches.  She had voluntary contractions, recruitment with fill was present and urethral sphincter was not relaxed with void.  The details of the procedure with the study tracings have been scanned into EPIC.   Urodynamic Impression:  1. Sensation was normal; capacity was normal 2. Stress Incontinence was not demonstrated at normal pressures; 3. Detrusor Overactivity was demonstrated without leakage. 4. Emptying was dysfunctional with a elevated PVR, a sustained detrusor contraction not present,  abdominal straining present, dyssynergic urethral sphincter activity on EMG.  Plan: - The patient will follow up  to discuss the findings and treatment options.

## 2023-11-24 ENCOUNTER — Ambulatory Visit: Admitting: Obstetrics

## 2023-11-24 ENCOUNTER — Encounter: Payer: Self-pay | Admitting: Gynecologic Oncology

## 2023-11-24 ENCOUNTER — Ambulatory Visit

## 2023-11-24 ENCOUNTER — Encounter: Payer: Self-pay | Admitting: Obstetrics

## 2023-11-24 DIAGNOSIS — R351 Nocturia: Secondary | ICD-10-CM | POA: Diagnosis not present

## 2023-11-24 DIAGNOSIS — Z9889 Other specified postprocedural states: Secondary | ICD-10-CM

## 2023-11-24 DIAGNOSIS — N814 Uterovaginal prolapse, unspecified: Secondary | ICD-10-CM | POA: Diagnosis not present

## 2023-11-24 DIAGNOSIS — N3946 Mixed incontinence: Secondary | ICD-10-CM
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
## 2023-11-24 NOTE — Assessment & Plan Note (Addendum)
-   history of midurethral sling by Dr. Brunetta in 1999 complicated by urinary retention with CIC x 1 week, UDS in 2024 with donut pessary placement. ROI from [facility] to review op report, testing and pessary placement, office to call for records. Denies resolution of leakage after surgery with position change required for voids since surgery  - patient reports inability to void at prior UDS - repeat UDS 11/21/23 showed normal MCC 583mL< no SUI, DO, dysfunctional voiding with elevated PVR and dyssynergic EMG. High pressure, low flow with Pdet at Qmax was 42 cm of water.  Qmax was 6 mL/sec.  It was a interrupted pattern. - repeat PVR today by catheterization - referral to pelvic floor PT due to concerns of outlet obstruction from pelvic floor dyssynergia vs. Prior midurethral sling - catheterized for , repeated CIC teaching today to resume if clinical change - discussed risks and benefits of urethrolysis at the time of surgery to improve voiding symptoms including urethral injury, worsening urinary leakage, need for additional treatment, and fistula.

## 2023-11-24 NOTE — Assessment & Plan Note (Signed)
-   For treatment of pelvic organ prolapse, we discussed options for management including expectant management, conservative management, and surgical management, such as Kegels, a pessary, pelvic floor physical therapy, and specific surgical procedures. - We discussed two options for prolapse repair:  1) vaginal repair without mesh - Pros - safer, no mesh complications - Cons - not as strong as mesh repair, higher risk of recurrence  2) laparoscopic repair with mesh - Pros - stronger, better long-term success - Cons - risks of mesh implant (erosion into vagina or bladder, adhering to the rectum, pain) - these risks are lower than with a vaginal mesh but still exist - Patient desires to proceed with laparoscopic uterosacral ligament suspension vs. Vaginal sacrospinous ligament suspension, anterior/posterior repair, perineorrhaphy at the time of surgery. Discussed concerns regarding possible bowel surgery and may have to determine route pending intraoperative findings.

## 2023-11-24 NOTE — Assessment & Plan Note (Addendum)
-   continue to avoid fluid intake 3 hours before bedtime - elevated feet during the day or use compression socks to reduce lower extremity swelling - resumed CPAP for sleep apnea without relief - trial of Trospium at bedtime and consider CIC at bedtime
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
## 2023-11-24 NOTE — Patient Instructions (Addendum)
 Continue to use CPAP and avoid fluid intake around 3 hours before bedtime.   Start clean intermittent catheterization if you experience sensation of incomplete emptying.   Start Trospium, monitor for signs and symptoms of urinary retention such as worsening urinary symptoms, increased frequency/urgency, or sensation of incomplete emptying.  Please call [phone] to schedule the earliest appointment for pelvic floor PT to improve your bladder emptying.

## 2023-11-25 ENCOUNTER — Ambulatory Visit: Attending: Obstetrics | Admitting: Physical Therapy

## 2023-11-25 ENCOUNTER — Telehealth: Payer: Self-pay | Admitting: Genetic Counselor

## 2023-11-25 ENCOUNTER — Encounter: Payer: Self-pay | Admitting: Physical Therapy

## 2023-11-25 ENCOUNTER — Inpatient Hospital Stay: Admitting: Licensed Clinical Social Worker

## 2023-11-25 ENCOUNTER — Ambulatory Visit: Payer: Self-pay | Admitting: Genetic Counselor

## 2023-11-25 DIAGNOSIS — M6281 Muscle weakness (generalized): Secondary | ICD-10-CM | POA: Diagnosis not present

## 2023-11-25 DIAGNOSIS — R293 Abnormal posture: Secondary | ICD-10-CM | POA: Diagnosis not present

## 2023-11-25 DIAGNOSIS — M62838 Other muscle spasm: Secondary | ICD-10-CM | POA: Insufficient documentation

## 2023-11-25 DIAGNOSIS — Z1379 Encounter for other screening for genetic and chromosomal anomalies: Secondary | ICD-10-CM | POA: Insufficient documentation

## 2023-11-25 DIAGNOSIS — R279 Unspecified lack of coordination: Secondary | ICD-10-CM | POA: Diagnosis not present

## 2023-11-25 NOTE — Telephone Encounter (Signed)
 I spoke to Ms. Brame and her spouse to review results of genetic testing, completed with [facility] Common Hereditary Cancers panel +RNA. 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 We discussed that we do not use VUS to make management decisions or identify at risk family members.  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 It is possible that the cancers in history occurred by chance or due to environmental factors. It is possible there are family members that have a variant that she did not inherit. It is also possible there is a hereditary cause for cancer that cannot be identified with current technology or was not testing. It will be important for her to keep in contact with genetics to keep up with whether additional testing may be needed.  Please see counseling note for further detail on this result.

## 2023-11-25 NOTE — Patient Instructions (Signed)
  15-20 mins in the evenings based on tolerance.  Please clear any positions with doctor as needed.  Please stop any position if negative symptoms occur (dizziness/lightheadedness/fatigue/increased pain, etc.)   Urge Incontinence  Ideal urination frequency is every 2-4 wakeful hours, which equates to 5-8 times within a 24-hour period.   Urge incontinence is leakage that occurs when the bladder muscle contracts, creating a sudden need to go before getting to the bathroom.   Going too often when your bladder isn't actually full can disrupt the body's automatic signals to store and hold urine longer, which will increase urgency/frequency.  In this case, the bladder "is running the show" and strategies can be learned to retrain this pattern.   One should be able to control the first urge to urinate, at around .  The bladder can hold up to a "grande latte," or . To help you gain control, practice the Urge Drill below when urgency strikes.  This drill will help retrain your bladder signals and allow you to store and hold urine longer.  The overall goal is to stretch out your time between voids to reach a more manageable voiding schedule.    Practice your quick flicks often throughout the day (each waking hour) even when you don't need feel the urge to go.  This will help strengthen your pelvic floor muscles, making them more effective in controlling leakage.  Urge Drill  When you feel an urge to go, follow these steps to regain control: Stop what you are doing and be still Take one deep breath, directing your air into your abdomen Think an affirming thought, such as "I've got this." Do 5 quick flicks of your pelvic floor Walk with control to the bathroom to void, or delay voiding

## 2023-11-25 NOTE — Progress Notes (Signed)
 [facility] CSW Progress Note  Clinical Child Psychotherapist contacted patient by phone to follow-up on questions about forms and applications.    Interventions: Provided brief mental health counseling with regard to adjustment to cancer and physical changes  Reviewed what is needed for [employer] for [facility] application Pt with questions about research documents- offered to transfer to research team who will also see pt at appt 10/30- pt opted to wait until appt   Patient is experiencing overwhelm with paperwork as well as trying to stay positive and herself as much as possible. CSW provided brief counseling and support around this adjustment. Discussed ways to allow the human emotion of sadness or fear and balancing it with enjoying the good days and continuing to act in alignment with values.  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 Pt is finding it hard to slow down more than she has had to before as well as the limitations on seeing her grandkids if they have a cold.    Follow Up Plan:  CSW will see patient on 10/30 during infusion    Dillan Candela E Claudius Mich, LCSW Clinical Social Worker [facility]

## 2023-11-25 NOTE — Progress Notes (Signed)
 HPI:  Miranda Hill was previously seen in the [facility] due to a personal and family history of cancer and concerns regarding a hereditary predisposition to cancer. Please refer to our prior cancer genetics clinic note for more information regarding our discussion, assessment and recommendations, at the time. Ms. Miranda Hill recent genetic test results were disclosed to her, as were recommendations warranted by these results. These results and recommendations are discussed in more detail below.  Results were disclosed by telephone on 11/25/23.   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 Request for therapeutic paracentesis. EXAM: ULTRASOUND GUIDED THERAPEUTIC PARACENTESIS MEDICATIONS: 6 mL 1% lidocaine  COMPLICATIONS: None immediate. PROCEDURE: Informed written consent was obtained from the patient after a discussion of the risks, benefits and alternatives to treatment. A timeout was performed prior to the initiation of the procedure. Initial ultrasound scanning demonstrates a large amount of ascites within the right lower abdominal quadrant. The right lower abdomen was prepped and draped in the usual sterile fashion. 1% lidocaine  was used for local anesthesia. Following this, a 19 gauge, 7-cm, Yueh catheter was introduced. An ultrasound image was saved for documentation purposes. The paracentesis was performed. The catheter was removed and a dressing was applied. The patient tolerated the procedure well without immediate post procedural complication. FINDINGS: A total of approximately 3.2 L of clear, amber fluid was removed. IMPRESSION: Successful ultrasound-guided paracentesis yielding  3.2 liters of peritoneal fluid. Performed by Clotilda Hesselbach, PA-C Electronically Signed   By: Ester Sides M.D.   On: 10/01/2023 12:46   NM PET Image Initial (PI) Skull Base To Thigh (F-18 FDG) Result Date: 09/25/2023 CLINICAL DATA:  Initial treatment strategy for omental and peritoneal nodularity compatible with carcinomatosis. EXAM: NUCLEAR MEDICINE PET SKULL BASE TO THIGH TECHNIQUE: 9.4 mCi F-18 FDG was injected intravenously. Full-ring PET imaging was performed from the skull base to thigh after the radiotracer. CT data was obtained and used for attenuation correction and anatomic localization. Fasting blood glucose: 116 mg/dl COMPARISON:  CT scan 1/74/7974 FINDINGS: Mediastinal blood pool activity: SUV max 2.4 Liver activity: SUV max NA NECK: No significant abnormal hypermetabolic activity in this region. Incidental CT findings: None. CHEST: Small type 1 hiatal hernia, accompanied by a small amount of ascites, faint metabolic activity in the ascites with maximum SUV 2.3, cannot exclude malignant ascites extension up through the hiatus. Incidental CT findings: Mild scarring or subsegmental atelectasis anteriorly in the right upper lobe. ABDOMEN/PELVIS: Heavy burden of hypermetabolic omental caking with malignant ascites and numerous foci of hypermetabolic activity along the liver capsule, right paracolic gutter, left paracolic gutter, mesentery, and pelvic ascites. A representative region of the omental caking in the left lower quadrant anterior to the descending colon a maximum SUV of 14.6. A hypermetabolic tumor deposit in the pelvic ascites anterior to the rectum on image 171 series 4 has maximum SUV of 16.0 and measures 2.7 cm in long axis. Hypermetabolic focus along the posterosuperior liver margin maximum SUV 7.8. Isolation of primary site is problematic given the tumor deposits along the cecum and expected location of the appendix as well as the adnexa. Overall moderate amount of malignant ascites.  Incidental CT findings: None. SKELETON: No significant abnormal hypermetabolic activity in this  region. Incidental CT findings: None. IMPRESSION: 1. Heavy burden of hypermetabolic omental caking with malignant ascites and numerous foci of hypermetabolic activity along the liver capsule, right paracolic gutter, left paracolic gutter, mesentery, and pelvic ascites. Isolation of primary site is problematic given the tumor deposits along the cecum and expected location of the appendix as well as the adnexa. Overall moderate amount of malignant ascites. 2. Small type 1 hiatal hernia, accompanied by a small amount of ascites, faint metabolic activity in the ascites with maximum SUV 2.3, cannot exclude malignant ascites extension up through the hiatus. Electronically Signed   By: Ryan Salvage M.D.   On: 09/25/2023 16:59   US  Paracentesis Result Date: 09/24/2023 INDICATION: other ascites Patient with history of bloating, abdominal distension, imaging findings of extensive omental and peritoneal nodularity, ascites; request received for diagnostic and therapeutic paracentesis. EXAM: ULTRASOUND GUIDED DIAGNOSTIC AND THERAPEUTIC PARACENTESIS MEDICATIONS: 8 mL 1% lidocaine  with epinephrine  COMPLICATIONS: None immediate. PROCEDURE: Informed written consent was obtained from the patient after a discussion of the risks, benefits and alternatives to treatment. A timeout was performed prior to the initiation of the procedure. Initial ultrasound scanning demonstrates a large amount of ascites within the LEFT lower abdominal quadrant. The left lower abdomen was prepped and draped in the usual sterile fashion. 1% lidocaine  was used for local anesthesia. Following this, a 6 Fr Safe-T-Centesis catheter was introduced. An ultrasound image was saved for documentation purposes. The paracentesis was performed. The catheter was removed and a dressing was applied. The patient tolerated the procedure well without immediate post  procedural complication. FINDINGS: A total of approximately 4.7 L of hazy, blood-tinged fluid was removed. Samples were sent to the laboratory as requested by the clinical team. IMPRESSION: Successful ultrasound-guided diagnostic and therapeutic paracentesis yielding 4.7 L of peritoneal fluid. Performed by: Franky Rakers, PA-C Electronically Signed   By: Thom Hall M.D.   On: 09/24/2023 17:10   CT ABDOMEN PELVIS W CONTRAST Result Date: 09/22/2023 CLINICAL DATA:  Bloating.  Distended abdomen. * Tracking Code: BO * EXAM: CT ABDOMEN AND PELVIS WITH CONTRAST TECHNIQUE: Multidetector CT imaging of the abdomen and pelvis was performed using the standard protocol following bolus administration of intravenous contrast. RADIATION DOSE REDUCTION: This exam was performed according to the departmental dose-optimization program which includes automated exposure control, adjustment of the mA and/or kV according to patient size and/or use of iterative reconstruction technique. CONTRAST:  100mL ISOVUE -300 IOPAMIDOL  (ISOVUE -300) INJECTION 61% COMPARISON:  None Available. FINDINGS: Lower chest: Small focus of atelectasis in the inferior lingula. No pulmonary nodules. Hepatobiliary: No focal hepatic lesion. Low-density gallstone noted. No sickle size. Pancreas: Pancreas is normal. No ductal dilatation. No pancreatic inflammation. Spleen: Normal spleen Adrenals/urinary tract: Adrenal glands and kidneys are normal. The ureters and bladder normal. Stomach/Bowel: Stomach is normal. The small bowel is floating on moderate volume intraperitoneal free fluid within the small bowel mesentery. No bowel obstruction. Terminal ileum is normal. Appendix not clearly identified. The colon is normal. Contrast travels the entirety of the colon to the rectum. Vascular/Lymphatic: Abdominal aorta is normal caliber. No periportal or retroperitoneal adenopathy. No pelvic adenopathy. Reproductive: Uterus and ovaries are grossly normal. Other: Moderate  volume intraperitoneal free fluid. There is extensive nodularity the greater omentum (omental caking, image 58/2 for example). There are peritoneal nodules noted. For example nodule adjacent the RIGHT hepatic lobe measuring 13 mm image 35/2. Peritoneal nodularity in the posterior cul-de-sac on image 84/2. Musculoskeletal: No aggressive osseous lesion. IMPRESSION: 1. Extensive omental and peritoneal nodularity consistent  with carcinomatosis. 2. Moderate volume intraperitoneal free fluid. 3. No clear ovarian or appendiceal neoplasm. No clear primary malignancy identified. These results will be called to the ordering clinician or representative by the Radiologist Assistant, and communication documented in the PACS or [employer]. Electronically Signed   By: Jackquline Boxer M.D.   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(HCC)   10/04/2023 Tumor Marker   Patient's tumor was tested for the following markers: CA-125. Results of the tumor marker test revealed 305.   10/08/2023 Procedure   Successful ultrasound-guided diagnostic and therapeutic paracentesis yielding 3.1 liters of peritoneal fluid.   10/08/2023 Pathology Results   CYTOLOGY - NON PAP  CASE: [id number]  PATIENT: Rupa Hemric  Non-Gynecological Cytology Report      Clinical History: None provided  Specimen Submitted:  A. 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 PERITONEAL, LLQ, BIOPSY:  - Metastatic carcinoma,  consistent with high-grade serous carcinoma (see comment).   Comment: Immunohistochemical stains are performed.  The tumor cells stain positive for WT1 and PAX8 (patchy), with overexpression of p53. Calretinin is negative.  The combined immunomorphologic features are consistent with high-grade serous carcinoma.    10/10/2023 Cancer Staging   Staging form: Ovary, Fallopian Tube, and Primary Peritoneal Carcinoma, AJCC 8th Edition - Clinical: FIGO Stage IIIC (cT3c, cN0, cM0) - Signed by Lonn Hicks, MD on 10/10/2023 Stage prefix: Initial diagnosis   10/14/2023 Procedure   Successful ultrasound-guided therapeutic paracentesis yielding 4.4 L liters of peritoneal fluid   10/16/2023 -  Chemotherapy   Patient is on Treatment Plan : OVARIAN Carboplatin  (AUC 6) + Paclitaxel  (175) q21d X 6 Cycles     10/16/2023 Tumor Marker   Patient's tumor was tested for the following markers: CA-125. Results of the tumor marker test revealed 164.   10/23/2023 Procedure   Successful ultrasound-guided therapeutic paracentesis yielding 1.3 liters of peritoneal fluid.   10/24/2023 Tumor Marker   Patient's tumor was tested for the following markers: CA-125. Results of the tumor marker test revealed 163.   11/07/2023 Tumor Marker   Patient's tumor was tested for the following markers: CA-125. Results of the tumor marker test revealed 185.   11/22/2023 Genetic Testing   Negative Common Hereditary Cancers panel +RNA, VUS in CHEK2, c.1270T>C (p.Tyr424His). The [facility] Common Hereditary Cancers panel includes analysis of the following 48 genes: APC, ATM, AXIN2, BAP1, BARD1, BMPR1A, BRCA1, BRCA2, BRIP1, CDH1, CDK4, CDKN2A, CHEK2, CTNNA1, DICER1, EPCAM, FH, GREM1, HOXB13, KIT, MBD4, MEN1, MLH1, MSH2, MSH3, MSH6, MUTYH, NF1, NTHL1, PALB2, PDGFRA, PMS2, POLD1, POLE, PTEN, RAD51C, RAD51D, SDHA, SDHB, SDHC, SDHD, SMAD4, SMARCA4, STK11, TP53, TSC1, TSC2, VHL. Report date 11/22/23.      FAMILY HISTORY:  We obtained a detailed,  4-generation family history.  Significant diagnoses are listed below: Family History  Problem Relation Age of Onset   Colon cancer Mother 30   Hypertension Mother    Diabetes Mother    Hypertension Father    Diabetes Father    Lung cancer Father 51 - 59  brain mets   Other Sister        hypoglycemic   Colon polyps Sister    Depression Brother    Skin cancer Maternal Uncle    Breast cancer Paternal Aunt        dx >50   Uterine cancer Maternal Grandmother        dx>50 y.o.   Autism spectrum disorder Son     Ms. Goding is unaware of previous family history of genetic testing for hereditary cancer risks. Patient's maternal ancestors are of [place] descent, and paternal ancestors are of [place] descent. There is reported Ashkenazi Jewish ancestry.    GENETIC TEST RESULTS: Genetic testing reported out on 11/22/23 through the [facility] Common Hereditary Cancers +RNA panel found no pathogenic mutations. The [facility] Common Hereditary Cancers panel includes analysis of the following 48 genes: APC, ATM, AXIN2, BAP1, BARD1, BMPR1A, BRCA1, BRCA2, BRIP1, CDH1, CDK4, CDKN2A, CHEK2, CTNNA1, DICER1, EPCAM, FH, GREM1, HOXB13, KIT, MBD4, MEN1, MLH1, MSH2, MSH3, MSH6, MUTYH, NF1, NTHL1, PALB2, PDGFRA, PMS2, POLD1, POLE, PTEN, RAD51C, RAD51D, SDHA, SDHB, SDHC, SDHD, SMAD4, SMARCA4, STK11, TP53, TSC1, TSC2, VHL. The test report has been scanned into EPIC and is located under the Molecular Pathology section of the Results Review tab.  A portion of the result report is included below for reference.     We discussed with Ms. Herman that because current genetic testing is not perfect, it is possible there may be a gene mutation in one of these genes that current testing cannot detect, but that chance is small.  We also discussed, that there could be another gene that has not yet been discovered, or that we have not yet tested, that is responsible for the cancer diagnoses in the family. It is also possible there  is a hereditary cause for the cancer in the family that Ms. Newmann did not inherit and therefore was not identified in her testing.  Therefore, it is important to remain in touch with cancer genetics in the future so that we can continue to offer Ms. Ragan the most up to date genetic testing.   Genetic testing did identify a variant of uncertain significance (VUS) was identified in the CHEK2 gene called c.1270T>C (p.Tyr424His).  At this time, it is unknown if this variant is associated with increased cancer risk or if this is a normal finding, but most variants such as this are found to be normal variability or benign changes when the lab learns more. It should not be used to make medical management decisions. With time, we suspect the lab will determine the significance of this variant, if any. If we do learn more about it, we will try to contact Ms. Navedo to discuss it further. However, it is important to stay in touch with us  periodically and keep the address and phone number up to date.  ADDITIONAL GENETIC TESTING: We discussed with Ms. Ballew that there are other genes that are associated with increased cancer risk that can be analyzed. Should Ms. Beavers wish to pursue additional genetic testing, we are happy to discuss and coordinate this testing, at any time.    CANCER SCREENING RECOMMENDATIONS: Ms. Hogge test result is considered negative (normal).  This means that we have not identified a hereditary cause for her personal and family history of cancer at this time. Most cancers happen by chance and this negative test suggests that her personal and family history of cancer may fall into this category.    Possible reasons for Ms.  Selvidge's negative genetic test include:  1. There may be a gene mutation in one of these genes that current testing methods cannot detect. The likelihood of this is low, but possible.   2. There could be another gene that has not yet been discovered, or that we have not yet tested,  that is responsible for the cancer diagnoses in the family.  3.  There may be no hereditary risk for cancer in the family. The cancers in Ms. Seder and/or her family may be sporadic/familial or due to other genetic and environmental factors. 4. It is also possible there is a hereditary cause for the cancer in the family that Ms. Schlie did not inherit.  Therefore, it is recommended she continue to follow the cancer management and screening guidelines provided by her oncology and primary healthcare provider. An individual's cancer risk and medical management are not determined by genetic test results alone. Overall cancer risk assessment incorporates additional factors, including personal medical history, family history, and any available genetic information that may result in a personalized plan for cancer prevention and surveillance  Given Ms. Cervenka's personal and family histories, we must interpret these negative results with some caution.  Families with features suggestive of hereditary risk for cancer tend to have multiple family members with cancer, diagnoses in multiple generations and diagnoses before the age of 42. Ms. Raigoza family exhibits some of these features. Thus, this result may simply reflect our current inability to detect all mutations within these genes or there may be a different gene that has not yet been discovered or tested.   RECOMMENDATIONS FOR FAMILY MEMBERS:  Individuals in this family might be at some increased risk of developing cancer, over the general population risk, simply due to the family history of cancer.  We recommended women in this family have a yearly mammogram beginning at age 90, or 27 years younger than the earliest onset of cancer, an annual clinical breast exam, and perform monthly breast self-exams. Women in this family should also have a gynecological exam as recommended by their primary provider. All family members should be referred for colonoscopy starting at  age 68, or 49 years younger than the earliest onset of cancer.  Other relatives may benefit from completing their own genetic testing, especially if they have been diagnosed with cancer.   FOLLOW-UP: Lastly, we discussed with Ms. Jacobs that cancer genetics is a rapidly advancing field and it is possible that new genetic tests will be appropriate for her and/or her family members in the future. We encouraged her to remain in contact with cancer genetics on an annual basis so we can update her personal and family histories and let her know of advances in cancer genetics that may benefit this family.   Our contact number was provided. Ms. Takemoto questions were answered to her satisfaction, and she knows she is welcome to call us  at anytime with additional questions or concerns.   Burnard Ogren, MS, [facility] Licensed, [email or web address] [phone]
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
## 2023-11-26 ENCOUNTER — Other Ambulatory Visit: Payer: Self-pay

## 2023-11-27 ENCOUNTER — Other Ambulatory Visit (HOSPITAL_COMMUNITY): Payer: Self-pay

## 2023-11-27 ENCOUNTER — Inpatient Hospital Stay

## 2023-11-27 ENCOUNTER — Encounter: Payer: Self-pay | Admitting: *Deleted

## 2023-11-27 ENCOUNTER — Other Ambulatory Visit: Payer: Self-pay | Admitting: Medical Genetics

## 2023-11-27 ENCOUNTER — Inpatient Hospital Stay: Admitting: Licensed Clinical Social Worker

## 2023-11-27 ENCOUNTER — Inpatient Hospital Stay: Admitting: Hematology and Oncology

## 2023-11-27 ENCOUNTER — Encounter: Payer: Self-pay | Admitting: Hematology and Oncology

## 2023-11-27 VITALS — BP 142/78 | HR 105 | Temp 98.2°F | Resp 20 | Ht 62.0 in | Wt 172.1 lb

## 2023-11-27 DIAGNOSIS — C579 Malignant neoplasm of female genital organ, unspecified: Secondary | ICD-10-CM | POA: Diagnosis not present

## 2023-11-27 DIAGNOSIS — Z006 Encounter for examination for normal comparison and control in clinical research program: Secondary | ICD-10-CM

## 2023-11-27 DIAGNOSIS — Z5111 Encounter for antineoplastic chemotherapy: Secondary | ICD-10-CM | POA: Diagnosis not present

## 2023-11-27 DIAGNOSIS — T451X5A Adverse effect of antineoplastic and immunosuppressive drugs, initial encounter: Secondary | ICD-10-CM

## 2023-11-27 DIAGNOSIS — R630 Anorexia: Secondary | ICD-10-CM | POA: Diagnosis not present

## 2023-11-27 DIAGNOSIS — C569 Malignant neoplasm of unspecified ovary: Secondary | ICD-10-CM

## 2023-11-27 DIAGNOSIS — R180 Malignant ascites: Secondary | ICD-10-CM | POA: Diagnosis not present

## 2023-11-27 DIAGNOSIS — K219 Gastro-esophageal reflux disease without esophagitis: Secondary | ICD-10-CM | POA: Diagnosis not present

## 2023-11-27 DIAGNOSIS — Z800 Family history of malignant neoplasm of digestive organs: Secondary | ICD-10-CM | POA: Diagnosis not present

## 2023-11-27 DIAGNOSIS — D6481 Anemia due to antineoplastic chemotherapy: Secondary | ICD-10-CM | POA: Diagnosis not present

## 2023-11-27 DIAGNOSIS — Z801 Family history of malignant neoplasm of trachea, bronchus and lung: Secondary | ICD-10-CM | POA: Diagnosis not present

## 2023-11-27 DIAGNOSIS — R531 Weakness: Secondary | ICD-10-CM | POA: Diagnosis not present

## 2023-11-27 DIAGNOSIS — R600 Localized edema: Secondary | ICD-10-CM | POA: Diagnosis not present

## 2023-11-27 DIAGNOSIS — N819 Female genital prolapse, unspecified: Secondary | ICD-10-CM | POA: Diagnosis not present

## 2023-11-27 DIAGNOSIS — Z809 Family history of malignant neoplasm, unspecified: Secondary | ICD-10-CM | POA: Diagnosis not present

## 2023-11-27 DIAGNOSIS — R971 Elevated cancer antigen 125 [CA 125]: Secondary | ICD-10-CM | POA: Diagnosis not present

## 2023-11-27 DIAGNOSIS — L089 Local infection of the skin and subcutaneous tissue, unspecified: Secondary | ICD-10-CM | POA: Diagnosis not present

## 2023-11-27 DIAGNOSIS — G4733 Obstructive sleep apnea (adult) (pediatric): Secondary | ICD-10-CM | POA: Diagnosis not present

## 2023-11-27 DIAGNOSIS — C563 Malignant neoplasm of bilateral ovaries: Secondary | ICD-10-CM | POA: Diagnosis not present

## 2023-11-27 LAB — CBC WITH DIFFERENTIAL (CANCER CENTER ONLY)
Abs Immature Granulocytes: 0.12 K/uL — ABNORMAL HIGH (ref 0.00–0.07)
Basophils Absolute: 0 K/uL (ref 0.0–0.1)
Basophils Relative: 0 %
Eosinophils Absolute: 0 K/uL (ref 0.0–0.5)
Eosinophils Relative: 0 %
HCT: 33.7 % — ABNORMAL LOW (ref 36.0–46.0)
Hemoglobin: 11.2 g/dL — ABNORMAL LOW (ref 12.0–15.0)
Immature Granulocytes: 1 %
Lymphocytes Relative: 6 %
Lymphs Abs: 0.7 K/uL (ref 0.7–4.0)
MCH: 28.2 pg (ref 26.0–34.0)
MCHC: 33.2 g/dL (ref 30.0–36.0)
MCV: 84.9 fL (ref 80.0–100.0)
Monocytes Absolute: 0.1 K/uL (ref 0.1–1.0)
Monocytes Relative: 1 %
Neutro Abs: 10.6 K/uL — ABNORMAL HIGH (ref 1.7–7.7)
Neutrophils Relative %: 92 %
Platelet Count: 170 K/uL (ref 150–400)
RBC: 3.97 MIL/uL (ref 3.87–5.11)
RDW: 18.6 % — ABNORMAL HIGH (ref 11.5–15.5)
WBC Count: 11.5 K/uL — ABNORMAL HIGH (ref 4.0–10.5)
nRBC: 0 % (ref 0.0–0.2)

## 2023-11-27 LAB — CMP (CANCER CENTER ONLY)
ALT: 26 U/L (ref 0–44)
AST: 16 U/L (ref 15–41)
Albumin: 4.2 g/dL (ref 3.5–5.0)
Alkaline Phosphatase: 117 U/L (ref 38–126)
Anion gap: 10 (ref 5–15)
BUN: 16 mg/dL (ref 6–20)
CO2: 25 mmol/L (ref 22–32)
Calcium: 9.2 mg/dL (ref 8.9–10.3)
Chloride: 103 mmol/L (ref 98–111)
Creatinine: 0.57 mg/dL (ref 0.44–1.00)
GFR, Estimated: >60 mL/min (ref >60)
Glucose, Bld: 330 mg/dL — ABNORMAL HIGH (ref 70–99)
Potassium: 4.2 mmol/L (ref 3.5–5.1)
Sodium: 138 mmol/L (ref 135–145)
Total Bilirubin: 0.5 mg/dL (ref 0.0–1.2)
Total Protein: 7.2 g/dL (ref 6.5–8.1)

## 2023-11-27 MED ORDER — CETIRIZINE HCL 10 MG/ML IV SOLN
10.0000 mg | Freq: Once | INTRAVENOUS | Status: AC
  Administered 2023-11-27: 10 mg via INTRAVENOUS
  Filled 2023-11-27: qty 1

## 2023-11-27 MED ORDER — PALONOSETRON HCL INJECTION 0.25 MG/5ML
0.2500 mg | Freq: Once | INTRAVENOUS | Status: AC
  Administered 2023-11-27: 0.25 mg via INTRAVENOUS
  Filled 2023-11-27: qty 5

## 2023-11-27 MED ORDER — FAMOTIDINE IN NACL 20-0.9 MG/50ML-% IV SOLN
20.0000 mg | Freq: Once | INTRAVENOUS | Status: AC
  Administered 2023-11-27: 20 mg via INTRAVENOUS
  Filled 2023-11-27: qty 50

## 2023-11-27 MED ORDER — ACETAMINOPHEN 325 MG PO TABS
650.0000 mg | ORAL_TABLET | Freq: Once | ORAL | Status: AC
  Administered 2023-11-27: 650 mg via ORAL
  Filled 2023-11-27: qty 2

## 2023-11-27 MED ORDER — SODIUM CHLORIDE 0.9 % IV SOLN: INTRAVENOUS | Status: DC

## 2023-11-27 MED ORDER — DIPHENHYDRAMINE HCL 50 MG/ML IJ SOLN
25.0000 mg | Freq: Once | INTRAMUSCULAR | Status: AC
  Administered 2023-11-27: 25 mg via INTRAVENOUS
  Filled 2023-11-27: qty 1

## 2023-11-27 MED ORDER — DEXAMETHASONE SOD PHOSPHATE PF 10 MG/ML IJ SOLN
10.0000 mg | Freq: Once | INTRAMUSCULAR | Status: AC
  Administered 2023-11-27: 10 mg via INTRAVENOUS

## 2023-11-27 MED ORDER — APREPITANT 130 MG/18ML IV EMUL
130.0000 mg | Freq: Once | INTRAVENOUS | Status: AC
  Administered 2023-11-27: 130 mg via INTRAVENOUS
  Filled 2023-11-27: qty 18

## 2023-11-27 MED ORDER — INSULIN ASPART 100 UNIT/ML IJ SOLN
10.0000 [IU] | Freq: Once | INTRAMUSCULAR | Status: AC
  Administered 2023-11-27: 10 [IU] via SUBCUTANEOUS
  Filled 2023-11-27: qty 1

## 2023-11-27 MED ORDER — SODIUM CHLORIDE 0.9 % IV SOLN
175.0000 mg/m2 | Freq: Once | INTRAVENOUS | Status: AC
  Administered 2023-11-27: 318 mg via INTRAVENOUS
  Filled 2023-11-27: qty 53

## 2023-11-27 MED ORDER — METFORMIN HCL 500 MG PO TABS
500.0000 mg | ORAL_TABLET | Freq: Two times a day (BID) | ORAL | 1 refills | Status: DC
  Filled 2023-11-27: qty 60, 30d supply, fill #0
  Filled 2023-12-26: qty 60, 30d supply, fill #1

## 2023-11-27 MED ORDER — SODIUM CHLORIDE 0.9 % IV SOLN
693.0000 mg | Freq: Once | INTRAVENOUS | Status: AC
  Administered 2023-11-27: 690 mg via INTRAVENOUS
  Filled 2023-11-27: qty 69

## 2023-11-27 NOTE — Assessment & Plan Note (Addendum)
 She will continue to participate with physical therapy as tolerated

## 2023-11-27 NOTE — Assessment & Plan Note (Addendum)
 This is likely due to recent treatment. The patient denies recent history of bleeding such as epistaxis, hematuria or hematochezia. She is asymptomatic from the anemia. I will observe for now.  She does not require transfusion now. I will continue the chemotherapy at current dose without dosage adjustment.  If the anemia gets progressive worse in the future, I might have to delay her treatment or adjust the chemotherapy dose.

## 2023-11-27 NOTE — Progress Notes (Signed)
 [facility] CSW Progress Note  Visual Merchandiser met with patient and husband to follow-up on need for community resources.    Interventions: Completed and submitted application to [employer] [facility] Discussed insurance - pt has applied for Medicaid. They will also look into marketplace plans with the assistance of [employer] when open enrollment starts on 11/1 Reviewed [employer] application that opens on the 1st business day of the month      Follow Up Plan:  Patient will contact CSW with any support or resource needs    Ahmaad Neidhardt E Adalynn Corne, LCSW Clinical Social Worker [facility]

## 2023-11-27 NOTE — Progress Notes (Addendum)
 Miranda Hill OFFICE PROGRESS NOTE  Patient Care Team: [name], FNP as PCP - General (Family Medicine) Christiana Kerri HERO, RN as Registered Nurse  Assessment & Plan Malignant neoplasm of ovary, unspecified laterality [facility]) I have reviewed multiple imaging studies with the patient and her son The patient has developed malignant ascites and abdominal carcinomatosis, source is unknown Her gastroenterologist will arrange for EGD and colonoscopy CT-guided biopsy of peritoneal disease was done, result pending I reviewed recent cytology from paracentesis which came back high-grade serous cancer, that along with elevated CA125, is highly suggestive of either ovarian cancer versus primary peritoneal cancer [facility] 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  Germline mutation testing was negative  She received cycle 1 of treatment on September 18 with good clinical response Her chronic leukocytosis had resolved Her malignant hypercalcemia has resolved She is eating better and ascites are resolving She tolerated cycle 2 of treatment well without major side effects With treatment today which is cycle 3, she had slight reaction with back pain with initial infusion of paclitaxel  but that has subsequently resolved and she is able to tolerate the rest of the treatment without major problems.  She was evaluated twice in the infusion room I plan to repeat imaging study after cycle 3 of therapy Anemia due to antineoplastic chemotherapy This is likely due to recent treatment. The patient denies recent history of bleeding such as epistaxis, hematuria or hematochezia. She is asymptomatic from the anemia. I will observe for now.  She does not require transfusion now. I will continue the chemotherapy at current dose without dosage  adjustment.  If the anemia gets progressive worse in the future, I might have to delay her treatment or adjust the chemotherapy dose.  Generalized weakness She will continue to participate with physical therapy as tolerated  Orders Placed This Encounter  Procedures   CT CHEST ABDOMEN PELVIS W CONTRAST    Standing Status:   Future    Expected Date:   12/04/2023    Expiration Date:   11/26/2024    If indicated for the ordered procedure, I authorize the administration of contrast media per Radiology protocol:   Yes    Does the patient have a contrast media/X-ray dye allergy?:   No    Preferred imaging location?:   [facility]    If indicated for the ordered procedure, I authorize the administration of oral contrast media per Radiology protocol:   Yes     Almarie Bedford, MD  INTERVAL HISTORY: she returns for treatment follow-up Complications related to previous cycle of chemotherapy included anemia,, bone aches,, and fatigue, She is seen in the infusion room several times The initial time was prior to treatment Within 10 to 15 minutes of treatment, she developed back pain.  That subsequently resolved and she was able to complete the rest of her paclitaxel  treatment She participated with physical therapy recently and complained of fatigue She had very mild numbness and tingling at the tips of toes but otherwise tolerated treatment well Her abdomen distention is resolving  PHYSICAL EXAMINATION: ECOG PERFORMANCE STATUS: 1 - Symptomatic but completely ambulatory  Lab Results  Component Value Date   CAN125 185.0 (H) 11/06/2023   CAN125 163.0 (H) 10/23/2023   RJW874 164.0 (H) 10/14/2023      Latest Ref Rng & Units  11/27/2023    7:36 AM 11/06/2023   10:59 AM 10/23/2023    1:40 PM  CBC  WBC 4.0 - 10.5 K/uL 11.5  7.7  3.7   Hemoglobin 12.0 - 15.0 g/dL 88.7  89.7  9.6   Hematocrit 36.0 - 46.0 % 33.7  32.7  29.4   Platelets 150 - 400 K/uL 170  279  137       Chemistry       Component Value Date/Time   NA 138 11/27/2023 0736   K 4.2 11/27/2023 0736   CL 103 11/27/2023 0736   CO2 25 11/27/2023 0736   BUN 16 11/27/2023 0736   CREATININE 0.57 11/27/2023 0736   CREATININE 0.78 02/18/2018 1553      Component Value Date/Time   CALCIUM 9.2 11/27/2023 0736   ALKPHOS 117 11/27/2023 0736   AST 16 11/27/2023 0736   ALT 26 11/27/2023 0736   BILITOT 0.5 11/27/2023 0736       There were no vitals filed for this visit. There were no vitals filed for this visit. Other relevant data reviewed during this visit included CBC and CMP

## 2023-11-27 NOTE — Progress Notes (Signed)
 Pt c/o mild headache and upper backpain about into her 3rd 3-hr taxol . BP and HR elevated. MD at chairside. Infusion stopped and tylenol  given. Pt returned to baseline 30 min after infusion was paused. Restarted at half rate and titrated up to full rate without further incident. Pt able to finish infusion and was discharged in stable condition.

## 2023-11-27 NOTE — Patient Instructions (Signed)
 [facility] - A DEPT OF [facility]  Discharge Instructions: Thank you for choosing [facility] to provide your oncology and hematology care.   If you have a lab appointment with the Cancer Center, please go directly to the Cancer Center and check in at the registration area.   Wear comfortable clothing and clothing appropriate for easy access to any Portacath or PICC line.   We strive to give you quality time with your provider. You may need to reschedule your appointment if you arrive late (15 or more minutes).  Arriving late affects you and other patients whose appointments are after yours.  Also, if you miss three or more appointments without notifying the office, you may be dismissed from the clinic at the provider's discretion.      For prescription refill requests, have your pharmacy contact our office and allow 72 hours for refills to be completed.    Today you received the following chemotherapy and/or immunotherapy agents paclitaxel , carboplatin       To help prevent nausea and vomiting after your treatment, we encourage you to take your nausea medication as directed.  BELOW ARE SYMPTOMS THAT SHOULD BE REPORTED IMMEDIATELY: *FEVER GREATER THAN 100.4 F (38 C) OR HIGHER *CHILLS OR SWEATING *NAUSEA AND VOMITING THAT IS NOT CONTROLLED WITH YOUR NAUSEA MEDICATION *UNUSUAL SHORTNESS OF BREATH *UNUSUAL BRUISING OR BLEEDING *URINARY PROBLEMS (pain or burning when urinating, or frequent urination) *BOWEL PROBLEMS (unusual diarrhea, constipation, pain near the anus) TENDERNESS IN MOUTH AND THROAT WITH OR WITHOUT PRESENCE OF ULCERS (sore throat, sores in mouth, or a toothache) UNUSUAL RASH, SWELLING OR PAIN  UNUSUAL VAGINAL DISCHARGE OR ITCHING   Items with * indicate a potential emergency and should be followed up as soon as possible or go to the Emergency Department if any problems should occur.  Please show the CHEMOTHERAPY ALERT CARD or  IMMUNOTHERAPY ALERT CARD at check-in to the Emergency Department and triage nurse.  Should you have questions after your visit or need to cancel or reschedule your appointment, please contact [facility] - A DEPT OF [facility]  Dept: [phone]  and follow the prompts.  Office hours are 8:00 a.m. to 4:30 p.m. Monday - Friday. Please note that voicemails left after 4:00 p.m. may not be returned until the following business day.  We are closed weekends and major holidays. You have access to a nurse at all times for urgent questions. Please call the main number to the clinic Dept: [phone] and follow the prompts.   For any non-urgent questions, you may also contact your provider using MyChart. We now offer e-Visits for anyone 25 and older to request care online for non-urgent symptoms. For details visit [email or web address].   Also download the MyChart app! Go to the app store, search MyChart, open the app, select [place], and log in with your MyChart username and password.

## 2023-11-27 NOTE — Research (Signed)
 [id number]- Effectiveness of Out-of Pocket Cost Communication and Financial Navigation (CostCOM) in Cancer Patients:   Met with patient and her husband in infusion room to follow up on the above study.  Patient states she has not had a chance to read the consent form and information sheet that was previously provided.  Briefly reviewed the study with patient and husband, informing of the purpose of the study, the 2 different arms of the study and what would be required of patient if she enrolled. Patient states she is interested in enrolling on the study and would like to go over the consent form with research staff and consent at her next clinic visit 12/11/23. Patient is scheduled to see Dr. Viktoria at 8 am and agreed to meet with a member of the research team afterwards. Informed patient we will schedule a research consent visit for 11/13 at 8:30 am and if this is not possible one of our team will call her to arrange a different date/time. She verbalized understanding.  Cherylyn Hoard, BSN, RN, [employer] Research Nurse II [phone] 11/27/2023

## 2023-11-27 NOTE — Assessment & Plan Note (Addendum)
 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  Germline mutation testing was negative  She received cycle 1 of treatment on September 18 with good clinical response Her chronic leukocytosis had resolved Her malignant hypercalcemia has resolved She is eating better and ascites are resolving She tolerated cycle 2 of treatment well without major side effects With treatment today which is cycle 3, she had slight reaction with back pain with initial infusion of paclitaxel  but that has subsequently resolved and she is able to tolerate the rest of the treatment without major problems.  She was evaluated twice in the infusion room I plan to repeat imaging study after cycle 3 of therapy

## 2023-12-02 ENCOUNTER — Other Ambulatory Visit: Payer: Self-pay | Admitting: Hematology and Oncology

## 2023-12-02 ENCOUNTER — Telehealth: Payer: Self-pay

## 2023-12-02 ENCOUNTER — Telehealth: Payer: Self-pay | Admitting: Oncology

## 2023-12-02 ENCOUNTER — Telehealth: Payer: Self-pay | Admitting: *Deleted

## 2023-12-02 ENCOUNTER — Other Ambulatory Visit: Payer: Self-pay | Admitting: Gynecologic Oncology

## 2023-12-02 DIAGNOSIS — C569 Malignant neoplasm of unspecified ovary: Secondary | ICD-10-CM

## 2023-12-02 NOTE — Telephone Encounter (Signed)
 Per Dr Viktoria fax records and surgical optimization form to the patient's PCP office Harper Batch, NP [phone])

## 2023-12-02 NOTE — Telephone Encounter (Signed)
 Called Miranda Hill and let her know that we are tentatively holding time on 01/14/24 for the joint surgery with Dr. Viktoria and Dr. Guadlupe.  Let her know that that preadmissions at [facility] will be calling to schedule a preop appointment and to schedule it for after she sees Dr. Viktoria on 12/11/23.  She verbalized understanding and agreement.

## 2023-12-02 NOTE — Telephone Encounter (Signed)
 Returned her call. She is concerned that her glucose at home today is too high at 135. Reassured her that is a good reading. Instructed to drink more water and watch her carb in take. She complains of nausea at times and will take Zofran  as needed. Compazine  causes sedation. She is having abdominal pain at times and will try tylenol  as needed for pain. She will call the office back for questions/ concerns.

## 2023-12-03 ENCOUNTER — Telehealth: Payer: Self-pay | Admitting: *Deleted

## 2023-12-03 ENCOUNTER — Telehealth: Payer: Self-pay | Admitting: Physical Therapy

## 2023-12-03 ENCOUNTER — Other Ambulatory Visit: Payer: Self-pay

## 2023-12-03 NOTE — Telephone Encounter (Signed)
 Patients spouse called concerned about side effects from the new medication Metformin which are abdominal cramping/diarrhea.  States that patient is no longer on steroid post chemo.  He states he has been checking her glucose at home and she is running 120, 135 and is asking if maybe due to the side effects could she try not taking the metformin when she has completed the steroids to lessen the side effects.  Routed to Dr Lonn to advise

## 2023-12-04 ENCOUNTER — Encounter: Payer: Self-pay | Admitting: Hematology and Oncology

## 2023-12-04 ENCOUNTER — Other Ambulatory Visit (HOSPITAL_COMMUNITY): Payer: Self-pay

## 2023-12-04 ENCOUNTER — Encounter: Payer: Self-pay | Admitting: Licensed Clinical Social Worker

## 2023-12-04 ENCOUNTER — Ambulatory Visit: Attending: Obstetrics | Admitting: Physical Therapy

## 2023-12-04 ENCOUNTER — Encounter: Payer: Self-pay | Admitting: Physical Therapy

## 2023-12-04 DIAGNOSIS — M62838 Other muscle spasm: Secondary | ICD-10-CM | POA: Insufficient documentation

## 2023-12-04 DIAGNOSIS — R293 Abnormal posture: Secondary | ICD-10-CM | POA: Insufficient documentation

## 2023-12-04 DIAGNOSIS — R279 Unspecified lack of coordination: Secondary | ICD-10-CM | POA: Diagnosis not present

## 2023-12-04 DIAGNOSIS — M6281 Muscle weakness (generalized): Secondary | ICD-10-CM | POA: Insufficient documentation

## 2023-12-04 NOTE — Telephone Encounter (Signed)
 Called and spoke with Miranda Hill and her husband. Given message to them both from Dr. Lonn. She will keep checking glucose and will decide if she stops metformin or not. She will call the office back for questions.

## 2023-12-04 NOTE — Telephone Encounter (Signed)
 Miranda Hill,  Please call her back I would leave it up to her to decide if she wants to take metformin or not
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
## 2023-12-04 NOTE — Progress Notes (Signed)
 [facility] CSW Progress Note  Clinical Child Psychotherapist contacted caregiver by phone to follow-up on insurance questions. Pt's husband shared that pt was approved for Medicaid. He had questions about it including the plans and when it applies.  CSW answered questions that I was able to and provided information on who to ask for other questions (ex: is it retroactive and if so, how far).  No other needs at this time.     Miranda Hill E Austin Herd, LCSW Clinical Social Worker [facility] [employer]

## 2023-12-05 ENCOUNTER — Encounter: Payer: Self-pay | Admitting: Hematology and Oncology

## 2023-12-05 ENCOUNTER — Other Ambulatory Visit: Payer: Self-pay

## 2023-12-05 ENCOUNTER — Ambulatory Visit (HOSPITAL_COMMUNITY)
Admission: RE | Admit: 2023-12-05 | Discharge: 2023-12-05 | Disposition: A | Discharge status: Home / Self Care | Source: Ambulatory Visit | Attending: Hematology and Oncology | Admitting: Hematology and Oncology

## 2023-12-05 ENCOUNTER — Encounter: Payer: Self-pay | Admitting: Obstetrics

## 2023-12-05 DIAGNOSIS — C569 Malignant neoplasm of unspecified ovary: Secondary | ICD-10-CM | POA: Insufficient documentation

## 2023-12-05 DIAGNOSIS — K8020 Calculus of gallbladder without cholecystitis without obstruction: Secondary | ICD-10-CM | POA: Diagnosis not present

## 2023-12-05 MED ORDER — IOHEXOL 300 MG/ML  SOLN
100.0000 mL | Freq: Once | INTRAMUSCULAR | Status: AC | PRN
  Administered 2023-12-05: 100 mL via INTRAVENOUS

## 2023-12-05 MED ORDER — HEPARIN SOD (PORK) LOCK FLUSH 100 UNIT/ML IV SOLN
500.0000 [IU] | Freq: Once | INTRAVENOUS | Status: AC
  Administered 2023-12-05: 500 [IU] via INTRAVENOUS

## 2023-12-05 MED ORDER — SODIUM CHLORIDE (PF) 0.9 % IJ SOLN
INTRAMUSCULAR | Status: AC
  Filled 2023-12-05: qty 50

## 2023-12-05 MED ORDER — IOHEXOL 9 MG/ML PO SOLN
500.0000 mL | ORAL | Status: AC
  Administered 2023-12-05 (×2): 500 mL via ORAL

## 2023-12-05 NOTE — Telephone Encounter (Signed)
 Patient called and stated she was contacted by [facility] record dept to fill out the request via email and send it back in order to receive her records.

## 2023-12-09 ENCOUNTER — Ambulatory Visit: Admitting: Obstetrics and Gynecology

## 2023-12-10 ENCOUNTER — Encounter: Payer: Self-pay | Admitting: Hematology and Oncology

## 2023-12-10 NOTE — Patient Instructions (Signed)
 Preparing for your Surgery   Plan for surgery on 01/14/2024 with Dr. Comer Dollar at [facility]. You will be scheduled for diagnostic laparoscopy (looking into the abdomen with a camera through a small incision), robotic assisted laparoscopic versus open total hysterectomy (removal of the uterus and cervix), bilateral salpingo-oophorectomy (removal of ovaries and fallopian tubes), omentectomy, debulking, possible bowel surgery including possible ostomy.    We also have you meet with the ostomy nurse before surgery to have a marking placed on your abdomen in case an ostomy is performed at the time of surgery.   Pre-operative Testing -You will receive a phone call from presurgical testing at [facility] to arrange for a pre-operative appointment and lab work.   -Bring your insurance card, copy of an advanced directive if applicable, medication list   -At that visit, you will be asked to sign a consent for a possible blood transfusion in case a transfusion becomes necessary during surgery.  The need for a blood transfusion is rare but having consent is a necessary part of your care.      -You should not be taking blood thinners or aspirin at least ten days prior to surgery unless instructed by your surgeon.   -Do not take supplements such as fish oil (omega 3), red yeast rice, turmeric before your surgery. STOP TAKING AT LEAST 10 DAYS BEFORE SURGERY. You want to avoid medications with aspirin in them including headache powders such as BC or Goody's), Excedrin migraine.   Day Before Surgery at Home -You have a BOWEL PREP the day before surgery. You will be advised you can have clear liquids up until 3 hours before your surgery.     AVOID GAS PRODUCING BEVERAGES. Things to avoid include carbonated beverages (fizzy beverages, sodas)   If your bowels are filled with gas, your surgeon will have difficulty visualizing your pelvic organs which increases your surgical risks.    Your role in recovery Your role is to become active as soon as directed by your doctor, while still giving yourself time to heal.  Rest when you feel tired. You will be asked to do the following in order to speed your recovery:   - Cough and breathe deeply. This helps to clear and expand your lungs and can prevent pneumonia after surgery.  - STAY ACTIVE WHEN YOU GET HOME. Do mild physical activity. Walking or moving your legs help your circulation and body functions return to normal. Do not try to get up or walk alone the first time after surgery.   -If you develop swelling on one leg or the other, pain in the back of your leg, redness/warmth in one of your legs, please call the office or go to the Emergency Room to have a doppler to rule out a blood clot. For shortness of breath, chest pain-seek care in the Emergency Room as soon as possible. - Actively manage your pain. Managing your pain lets you move in comfort. We will ask you to rate your pain on a scale of zero to 10. It is your responsibility to tell your doctor or nurse where and how much you hurt so your pain can be treated.   Special Considerations -If you are diabetic, you may be placed on insulin after surgery to have closer control over your blood sugars to promote healing and recovery.  This does not mean that you will be discharged on insulin.  If applicable, your oral antidiabetics will be resumed when you are  tolerating a solid diet.   -Your final pathology results from surgery should be available around one week after surgery and the results will be relayed to you when available.   -Dr. Olam Mill is the surgeon that assists your GYN Oncologist with surgery.  If you end up staying the night, the next day after your surgery you will either see Dr. Viktoria, Dr. Eldonna, or Dr. Olam Mill.   -FMLA forms can be faxed to [phone] and please allow 5-7 business days for completion.   Pain Management After  Surgery -You will be prescribed your pain medication and bowel regimen medications before surgery closer to the date so that you can have these available when you are discharged from the hospital. The pain medication is for use ONLY AFTER surgery and a new prescription will not be given.    -Make sure that you have Tylenol  IF YOU ARE ABLE TO TAKE THESE MEDICATION at home to use on a regular basis after surgery for pain control.    -Review the attached handout on narcotic use and their risks and side effects.    Bowel Regimen -You will be prescribed Sennakot-S to take nightly to prevent constipation especially if you are taking the narcotic pain medication intermittently.  It is important to prevent constipation and drink adequate amounts of liquids. You can stop taking this medication when you are not taking pain medication and you are back on your normal bowel routine. IF YOU HAVE BOWEL SURGERY, THIS MAY BE ADJUSTED.   Risks of Surgery Risks of surgery are low but include bleeding, infection, damage to surrounding structures, re-operation, blood clots, and very rarely death.     Blood Transfusion Information (For the consent to be signed before surgery)   We will be checking your blood type before surgery so in case of emergencies, we will know what type of blood you would need.                                             WHAT IS A BLOOD TRANSFUSION?   A transfusion is the replacement of blood or some of its parts. Blood is made up of multiple cells which provide different functions. Red blood cells carry oxygen and are used for blood loss replacement. White blood cells fight against infection. Platelets control bleeding. Plasma helps clot blood. Other blood products are available for specialized needs, such as hemophilia or other clotting disorders. BEFORE THE TRANSFUSION  Who gives blood for transfusions?  You may be able to donate blood to be used at a later date on yourself  (autologous donation). Relatives can be asked to donate blood. This is generally not any safer than if you have received blood from a stranger. The same precautions are taken to ensure safety when a relative's blood is donated. Healthy volunteers who are fully evaluated to make sure their blood is safe. This is blood bank blood. Transfusion therapy is the safest it has ever been in the practice of medicine. Before blood is taken from a donor, a complete history is taken to make sure that person has no history of diseases nor engages in risky social behavior (examples are intravenous drug use or sexual activity with multiple partners). The donor's travel history is screened to minimize risk of transmitting infections, such as malaria. The donated blood is tested for signs of infectious diseases,  such as HIV and hepatitis. The blood is then tested to be sure it is compatible with you in order to minimize the chance of a transfusion reaction. If you or a relative donates blood, this is often done in anticipation of surgery and is not appropriate for emergency situations. It takes many days to process the donated blood. RISKS AND COMPLICATIONS Although transfusion therapy is very safe and saves many lives, the main dangers of transfusion include:  Getting an infectious disease. Developing a transfusion reaction. This is an allergic reaction to something in the blood you were given. Every precaution is taken to prevent this. The decision to have a blood transfusion has been considered carefully by your caregiver before blood is given. Blood is not given unless the benefits outweigh the risks.   AFTER SURGERY INSTRUCTIONS   Return to work: 4-6 weeks if applicable   You may have a white honeycomb dressing over your larger incision if you have open surgery. This dressing can be removed 5 days after surgery and you do not need to reapply a new dressing. Once you remove the dressing, you will notice that you  have the surgical glue (dermabond) on the incision and this will peel off on its own. You can get this dressing wet in the shower the days after surgery prior to removal on the 5th day.    You will need to be on a blood thinner after surgery to prevent blood clots for 2 weeks versus 4 weeks based on the surgery being through small incisions or a large incision. This can be given in pill form or injections.    AVOID USE OF NSAIDS (IBUPROFEN, NAPROXEN) WHILE TAKING THE BLOOD THINNER.    Activity: 1. Be up and out of the bed during the day.  Take a nap if needed.  You may walk up steps but be careful and use the hand rail.  Stair climbing will tire you more than you think, you may need to stop part way and rest.    2. No lifting or straining for 6 weeks over 10 pounds. No pushing, pulling, straining for 6 weeks.   3. No driving for 4-89 days when the following criteria have been met: Do not drive if you are taking narcotic pain medicine and make sure that your reaction time has returned.    4. You can shower as soon as the next day after surgery. Shower daily.  Use your regular soap and water (not directly on the incision) and pat your incision(s) dry afterwards; don't rub.  No tub baths or submerging your body in water until cleared by your surgeon. If you have the soap that was given to you by pre-surgical testing that was used before surgery, you do not need to use it afterwards because this can irritate your incisions.    5. No sexual activity and nothing in the vagina for 12 weeks.   6. You may experience a small amount of clear drainage from your incisions, which is normal.  If the drainage persists, increases, or changes color please call the office.   7. Do not use creams, lotions, or ointments such as neosporin on your incisions after surgery until advised by your surgeon because they can cause removal of the dermabond glue on your incisions.     8. You may experience vaginal spotting  after surgery or when the stitches at the top of the vagina begin to dissolve.  The spotting is normal but if you  experience heavy bleeding, call our office.   9. Take Tylenol  first for pain if you are able to take these medication and only use narcotic pain medication for severe pain not relieved by the Tylenol .  Monitor your Tylenol  intake to a max of 4,000 mg in a 24 hour period.    Diet: 1. Low sodium Heart Healthy Diet is recommended but you are cleared to resume your normal (before surgery) diet after your procedure.   2. It is safe to use a laxative, such as Miralax  or Colace, if you have difficulty moving your bowels before surgery. You have been prescribed Sennakot-S to take if constipated at bedtime every evening after surgery to keep bowel movements regular and to prevent constipation.     Wound Care: 1. Keep clean and dry.  Shower daily.   Reasons to call the Doctor: Fever - Oral temperature greater than 100.4 degrees Fahrenheit Foul-smelling vaginal discharge Difficulty urinating Nausea and vomiting Increased pain at the site of the incision that is unrelieved with pain medicine. Difficulty breathing with or without chest pain New calf pain especially if only on one side Sudden, continuing increased vaginal bleeding with or without clots.   Contacts: For questions or concerns you should contact:   Dr. Comer Dollar at [phone]   Eleanor Epps, NP at [phone]   After Hours: call [phone] and have the GYN Oncologist paged/contacted (after 5 pm or on the weekends). You will speak with an after hours RN and let he or she know you have had surgery.   Messages sent via mychart are for non-urgent matters and are not responded to after hours so for urgent needs, please call the after hours number.   GYN Oncology Bowel Preparation for surgery   There are two important steps to take to prepare for your surgery:   1. Cleansing of your colon: all the stool is  washed out of your colon.   2. Take antibiotics: taken by mouth to help prevent infection.   INSTRUCTIONS:   As Soon As Possible (at least 2-3 days BEFORE your surgery)   Please buy the following (5) items from a pharmacy:   The first item is an antibiotic. The first four items will be sent in as prescriptions and you can get the Gatorade or powerade at the drug store or grocery store.    1. Flagyl pills - 1 gram, 3 doses (antibiotic)   The next 2 items are laxatives and work to cleanse your colon.   3. 2 Dulcolax 5 mg tablets   4. 238 grams of Miralax  (8.3 ounce bottle)   5. 64 oz of Gatorade or Powerade (not red)   (NOTE: If you are allergic to any of these medications, the prep will NOT be prescribed for your. Please let your physician know of any allergies.)   The Day Before Your Surgery   1. Do NOT eat any solid food. Do NOT drink unfiltered juices such as apple cider. Drink only clear liquids such as juice, black coffee, tea, sports drinks, soda pop, Jell-O, water. Please refer to handout from the pre-care suite for more details).   2. Starting at 9:00 a.m.: Take 2 Dulcolax tablets with 2 glasses of clear liquid.   3. 11:00 a.m.: Mix whole bottle of Miralax  in 64 oz of Gatorade and drink one 8 oz glass every 15 minutes until gone.   4. When you have finished the Miralax , drink at least 4 glasses of clear liquids of your choice.  You will start experiencing diarrhea anywhere between 30 minutes to 3 hours after completing the Miralax . Keep drinking plenty of clear liquids throughout the day. This will keep you from getting dehydrated from the diarrhea.   5. Take your antibiotics by mouth at these times after you complete the Miralax :   - At 2 p.m.: Take Flagyl (1 gram, total of two tablets)   - At 3 p.m.: Take Flagyl 2 tablets (1000 mg total or 1 gm)   - At 10 p.m.: Take Flagyl 2 tablets (1000 mg total or 1 gm)   The Day of Your Surgery   On the morning of your surgery,  only take the medicines that the pre-care suite told you were okay to take. Take them only with a sip of water.   Special Instructions:   - Please be sure you make your surgeon aware if you have diabetes. Your diabetic medications may need to be adjusted.   - If you feel dizzy or have severe nausea, vomiting or abdominal pain, or if you cannot finish drinking the bowel prep, please call the Gynecologic Oncology clinic at [phone] or your surgeon.   - If you have any life-threatening symptoms including wheezing, chest tightness, fever, swelling of your face, lips, tongue or throat, call [phone]- right away.   Questions?   Please call if you have questions or concerns:   - Weekdays 8:00 a.m. to 5:00 pm: Call the office at [phone]   - After hours and on weekends and holidays, call the paging operator at [phone] and ask for the GYN ONC on call to be paged.

## 2023-12-11 ENCOUNTER — Other Ambulatory Visit: Payer: Self-pay | Admitting: Hematology and Oncology

## 2023-12-11 ENCOUNTER — Inpatient Hospital Stay

## 2023-12-11 ENCOUNTER — Telehealth: Payer: Self-pay | Admitting: Oncology

## 2023-12-11 ENCOUNTER — Telehealth: Payer: Self-pay | Admitting: Licensed Clinical Social Worker

## 2023-12-11 ENCOUNTER — Inpatient Hospital Stay: Admitting: *Deleted

## 2023-12-11 ENCOUNTER — Encounter: Payer: Self-pay | Admitting: *Deleted

## 2023-12-11 ENCOUNTER — Inpatient Hospital Stay: Attending: Hematology and Oncology | Admitting: Gynecologic Oncology

## 2023-12-11 ENCOUNTER — Encounter: Payer: Self-pay | Admitting: Hematology and Oncology

## 2023-12-11 ENCOUNTER — Encounter: Payer: Self-pay | Admitting: Gynecologic Oncology

## 2023-12-11 ENCOUNTER — Inpatient Hospital Stay: Admitting: Gynecologic Oncology

## 2023-12-11 VITALS — BP 141/72 | HR 96 | Temp 98.3°F | Resp 16 | Wt 172.0 lb

## 2023-12-11 DIAGNOSIS — C786 Secondary malignant neoplasm of retroperitoneum and peritoneum: Secondary | ICD-10-CM | POA: Diagnosis not present

## 2023-12-11 DIAGNOSIS — D61818 Other pancytopenia: Secondary | ICD-10-CM | POA: Insufficient documentation

## 2023-12-11 DIAGNOSIS — C569 Malignant neoplasm of unspecified ovary: Secondary | ICD-10-CM

## 2023-12-11 DIAGNOSIS — Z5111 Encounter for antineoplastic chemotherapy: Secondary | ICD-10-CM | POA: Insufficient documentation

## 2023-12-11 DIAGNOSIS — C563 Malignant neoplasm of bilateral ovaries: Secondary | ICD-10-CM | POA: Insufficient documentation

## 2023-12-11 DIAGNOSIS — Z7189 Other specified counseling: Secondary | ICD-10-CM | POA: Diagnosis not present

## 2023-12-11 NOTE — Research (Unsigned)
 Effectiveness of Out-of-Pocket Psychologist, Forensic ([employer]) in Cancer Patients    Patient Miranda Hill was identified by Dr.Gorsuch as a potential candidate for the above listed study.  This Clinical Research Nurse met with RICCI PAFF, [id number] on 12/11/23 in a manner and location that ensures patient privacy to discuss participation in the above listed research study.  Patient is Accompanied by husband.  Patient was previously provided with informed consent documents.  Patient confirmed they have read the informed consent documents.  As outlined in the informed consent form, this Nurse and Miranda Hill discussed the purpose of the research study, the investigational nature of the study, study procedures and requirements for study participation, potential risks and benefits of study participation, as well as alternatives to participation.  This study is not blinded or double-blinded. The patient understands participation is voluntary and they may withdraw from study participation at any time.  Each study arm was reviewed, and randomization discussed.  This study does not involve an investigational drug or device. This study does not involve a placebo. Patient understands enrollment is pending full eligibility review.   Confidentiality and how the patient's information will be used as part of study participation were discussed.  Patient was informed there is reimbursement provided for their time and effort spent on trial participation.  The patient is encouraged to discuss research study participation with their insurance provider to determine what costs they may incur as part of study participation, including research related injury.    All questions were answered to patient's satisfaction.  The informed consent and separate HIPAA Authorization was reviewed page by page.  The patient's mental and emotional status is appropriate to provide informed consent, and the  patient verbalizes an understanding of study participation.  Patient has agreed to participate in the above listed research study and has voluntarily signed the informed consent version date 08/25/23, [place] Active Date: 10/06/2023 and separate HIPAA Authorization, version date approved 10/15/2023 on 12/11/23 at 9:26AM.  The patient was provided with a copy of the signed informed consent form and separate HIPAA Authorization for their reference.  No study specific procedures were obtained prior to the signing of the informed consent document.  Approximately 30 minutes were spent with the patient reviewing the informed consent documents.  Patient was not requested to complete a Release of Information form.  The pt elected to complete her baseline questionnaires online. The pt completed the Participant Contact Information Sheet.  The pt was offered reimbursement in the form of [employer], [employer], or [employer] gift cards.  The pt requested reimbursement in the form of an [employer] gift card. Pt stated they recently got approved for Medicaid and was wondering if they need to get a secondary private insurance as well. Informed pt that this nurse will reach out to social worker for pt who will be better to answer that question for pt. Her and her husband are in agreement and did not have any other questions at the time.   This Nurse has reviewed this patient's inclusion and exclusion criteria and confirmed Miranda Hill is eligible for study participation.  Miranda Hill, architectural technologist, confirmed the pt met all eligibility criteria for Step O enrollment.  In addition, eligibility confirmed by treating investigator, Dr.Gorsuch, who also agrees that patient should proceed with  Step 0 enrollment  Miranda Larsen, RN, BSN Clinical Research Nurse [phone] 12/11/2023

## 2023-12-11 NOTE — Progress Notes (Addendum)
 Gynecologic Oncology Return Clinic Visit  12/11/23  Reason for Visit: treatment planning  Treatment History: Oncology History Overview Note  MMR normal Her2 (0) negative   Ovarian cancer (HCC)  09/01/2023 Pathology Results   Pap smear is negative for malignancy   09/22/2023 Imaging   IR Paracentesis Result Date: 10/01/2023 INDICATION: Patient with recently diagnosed metastatic cancer of unknown primary with recurrent malignant ascites. Request for therapeutic paracentesis. EXAM: ULTRASOUND GUIDED THERAPEUTIC PARACENTESIS MEDICATIONS: 6 mL 1% lidocaine  COMPLICATIONS: None immediate. PROCEDURE: Informed written consent was obtained from the patient after a discussion of the risks, benefits and alternatives to treatment. A timeout was performed prior to the initiation of the procedure. Initial ultrasound scanning demonstrates a large amount of ascites within the right lower abdominal quadrant. The right lower abdomen was prepped and draped in the usual sterile fashion. 1% lidocaine  was used for local anesthesia. Following this, a 19 gauge, 7-cm, Yueh catheter was introduced. An ultrasound image was saved for documentation purposes. The paracentesis was performed. The catheter was removed and a dressing was applied. The patient tolerated the procedure well without immediate post procedural complication. FINDINGS: A total of approximately 3.2 L of clear, amber fluid was removed. IMPRESSION: Successful ultrasound-guided paracentesis yielding 3.2 liters of peritoneal fluid. Performed by Clotilda Hesselbach, PA-C Electronically Signed   By: Ester Sides M.D.   On: 10/01/2023 12:46   NM PET Image Initial (PI) Skull Base To Thigh (F-18 FDG) Result Date: 09/25/2023 CLINICAL DATA:  Initial treatment strategy for omental and peritoneal nodularity compatible with carcinomatosis. EXAM: NUCLEAR MEDICINE PET SKULL BASE TO THIGH TECHNIQUE: 9.4 mCi F-18 FDG was injected intravenously. Full-ring PET imaging was performed  from the skull base to thigh after the radiotracer. CT data was obtained and used for attenuation correction and anatomic localization. Fasting blood glucose: 116 mg/dl COMPARISON:  CT scan 1/74/7974 FINDINGS: Mediastinal blood pool activity: SUV max 2.4 Liver activity: SUV max NA NECK: No significant abnormal hypermetabolic activity in this region. Incidental CT findings: None. CHEST: Small type 1 hiatal hernia, accompanied by a small amount of ascites, faint metabolic activity in the ascites with maximum SUV 2.3, cannot exclude malignant ascites extension up through the hiatus. Incidental CT findings: Mild scarring or subsegmental atelectasis anteriorly in the right upper lobe. ABDOMEN/PELVIS: Heavy burden of hypermetabolic omental caking with malignant ascites and numerous foci of hypermetabolic activity along the liver capsule, right paracolic gutter, left paracolic gutter, mesentery, and pelvic ascites. A representative region of the omental caking in the left lower quadrant anterior to the descending colon a maximum SUV of 14.6. A hypermetabolic tumor deposit in the pelvic ascites anterior to the rectum on image 171 series 4 has maximum SUV of 16.0 and measures 2.7 cm in long axis. Hypermetabolic focus along the posterosuperior liver margin maximum SUV 7.8. Isolation of primary site is problematic given the tumor deposits along the cecum and expected location of the appendix as well as the adnexa. Overall moderate amount of malignant ascites. Incidental CT findings: None. SKELETON: No significant abnormal hypermetabolic activity in this region. Incidental CT findings: None. IMPRESSION: 1. Heavy burden of hypermetabolic omental caking with malignant ascites and numerous foci of hypermetabolic activity along the liver capsule, right paracolic gutter, left paracolic gutter, mesentery, and pelvic ascites. Isolation of primary site is problematic given the tumor deposits along the cecum and expected location of the  appendix as well as the adnexa. Overall moderate amount of malignant ascites. 2. Small type 1 hiatal hernia, accompanied by a small  amount of ascites, faint metabolic activity in the ascites with maximum SUV 2.3, cannot exclude malignant ascites extension up through the hiatus. Electronically Signed   By: Ryan Salvage M.D.   On: 09/25/2023 16:59   US  Paracentesis Result Date: 09/24/2023 INDICATION: other ascites Patient with history of bloating, abdominal distension, imaging findings of extensive omental and peritoneal nodularity, ascites; request received for diagnostic and therapeutic paracentesis. EXAM: ULTRASOUND GUIDED DIAGNOSTIC AND THERAPEUTIC PARACENTESIS MEDICATIONS: 8 mL 1% lidocaine  with epinephrine  COMPLICATIONS: None immediate. PROCEDURE: Informed written consent was obtained from the patient after a discussion of the risks, benefits and alternatives to treatment. A timeout was performed prior to the initiation of the procedure. Initial ultrasound scanning demonstrates a large amount of ascites within the LEFT lower abdominal quadrant. The left lower abdomen was prepped and draped in the usual sterile fashion. 1% lidocaine  was used for local anesthesia. Following this, a 6 Fr Safe-T-Centesis catheter was introduced. An ultrasound image was saved for documentation purposes. The paracentesis was performed. The catheter was removed and a dressing was applied. The patient tolerated the procedure well without immediate post procedural complication. FINDINGS: A total of approximately 4.7 L of hazy, blood-tinged fluid was removed. Samples were sent to the laboratory as requested by the clinical team. IMPRESSION: Successful ultrasound-guided diagnostic and therapeutic paracentesis yielding 4.7 L of peritoneal fluid. Performed by: Franky Rakers, PA-C Electronically Signed   By: Thom Hall M.D.   On: 09/24/2023 17:10   CT ABDOMEN PELVIS W CONTRAST Result Date: 09/22/2023 CLINICAL DATA:  Bloating.   Distended abdomen. * Tracking Code: BO * EXAM: CT ABDOMEN AND PELVIS WITH CONTRAST TECHNIQUE: Multidetector CT imaging of the abdomen and pelvis was performed using the standard protocol following bolus administration of intravenous contrast. RADIATION DOSE REDUCTION: This exam was performed according to the departmental dose-optimization program which includes automated exposure control, adjustment of the mA and/or kV according to patient size and/or use of iterative reconstruction technique. CONTRAST:  ISOVUE -300 IOPAMIDOL  (ISOVUE -300) INJECTION 61% COMPARISON:  None Available. FINDINGS: Lower chest: Small focus of atelectasis in the inferior lingula. No pulmonary nodules. Hepatobiliary: No focal hepatic lesion. Low-density gallstone noted. No sickle size. Pancreas: Pancreas is normal. No ductal dilatation. No pancreatic inflammation. Spleen: Normal spleen Adrenals/urinary tract: Adrenal glands and kidneys are normal. The ureters and bladder normal. Stomach/Bowel: Stomach is normal. The small bowel is floating on moderate volume intraperitoneal free fluid within the small bowel mesentery. No bowel obstruction. Terminal ileum is normal. Appendix not clearly identified. The colon is normal. Contrast travels the entirety of the colon to the rectum. Vascular/Lymphatic: Abdominal aorta is normal caliber. No periportal or retroperitoneal adenopathy. No pelvic adenopathy. Reproductive: Uterus and ovaries are grossly normal. Other: Moderate volume intraperitoneal free fluid. There is extensive nodularity the greater omentum (omental caking, image 58/2 for example). There are peritoneal nodules noted. For example nodule adjacent the RIGHT hepatic lobe measuring 13 mm image 35/2. Peritoneal nodularity in the posterior cul-de-sac on image 84/2. Musculoskeletal: No aggressive osseous lesion. IMPRESSION: 1. 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 2. Moderate volume intraperitoneal free  fluid. 3. No clear ovarian or appendiceal neoplasm. No clear primary malignancy identified. These results will be called to the ordering clinician or representative by the Radiologist Assistant, and communication documented in the PACS or [employer]. Electronically Signed   By: Jackquline Boxer M.D.   On: 09/22/2023 10:36      09/25/2023 Pathology Results   Pathology from malignant ascites came back positive for  malignancy but insufficient cellularity for further characterization   10/03/2023 Initial Diagnosis   Carcinomatosis peritonei (HCC)   10/04/2023 Tumor Marker   Patient's tumor was tested for the following markers: CA-125. Results of the tumor marker test revealed 305.   10/08/2023 Procedure   Successful ultrasound-guided diagnostic and therapeutic paracentesis yielding 3.1 liters of peritoneal fluid.   10/08/2023 Pathology Results   CYTOLOGY - NON PAP  CASE: [id number]  PATIENT: Jisell Ganoe  Non-Gynecological Cytology Report      Clinical History: None provided  Specimen Submitted:  A. 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 PERITONEAL, LLQ, BIOPSY:  - Metastatic carcinoma, consistent with high-grade serous carcinoma (see comment).   Comment: Immunohistochemical stains are performed.  The tumor cells stain positive for WT1 and PAX8 (patchy), with overexpression of p53. Calretinin is negative.  The combined immunomorphologic features are consistent with high-grade serous carcinoma.    10/10/2023 Cancer Staging   Staging form: Ovary, Fallopian Tube, and Primary Peritoneal Carcinoma, AJCC 8th Edition - Clinical: FIGO Stage IIIC (cT3c, cN0, cM0) - Signed by  Lonn Hicks, MD on 10/10/2023 Stage prefix: Initial diagnosis   10/14/2023 Procedure   Successful ultrasound-guided therapeutic paracentesis yielding 4.4 L liters of peritoneal fluid   10/16/2023 -  Chemotherapy   Patient is on Treatment Plan : OVARIAN Carboplatin  (AUC 6) + Paclitaxel  (175) q21d X 6 Cycles     10/16/2023 Tumor Marker   Patient's tumor was tested for the following markers: CA-125. Results of the tumor marker test revealed 164.   10/23/2023 Procedure   Successful ultrasound-guided therapeutic paracentesis yielding 1.3 liters of peritoneal fluid.   10/24/2023 Tumor Marker   Patient's tumor was tested for the following markers: CA-125. Results of the tumor marker test revealed 163.   11/07/2023 Tumor Marker   Patient's tumor was tested for the following markers: CA-125. Results of the tumor marker test revealed 185.   11/22/2023 Genetic Testing   Negative Common Hereditary Cancers panel +RNA, VUS in CHEK2, c.1270T>C (p.Tyr424His). The [employer] Common Hereditary Cancers panel includes analysis of the following 48 genes: APC, ATM, AXIN2, BAP1, BARD1, BMPR1A, BRCA1, BRCA2, BRIP1, CDH1, CDK4, CDKN2A, CHEK2, CTNNA1, DICER1, EPCAM, FH, GREM1, HOXB13, KIT, MBD4, MEN1, MLH1, MSH2, MSH3, MSH6, MUTYH, NF1, NTHL1, PALB2, PDGFRA, PMS2, POLD1, POLE, PTEN, RAD51C, RAD51D, SDHA, SDHB, SDHC, SDHD, SMAD4, SMARCA4, STK11, TP53, TSC1, TSC2, VHL. Report date 11/22/23.    12/05/2023 Imaging   CT CHEST ABDOMEN PELVIS W CONTRAST Result Date: 12/08/2023 EXAM: CT CHEST, ABDOMEN AND PELVIS WITH CONTRAST 12/05/2023 02:39:41 PM TECHNIQUE: CT of the chest, abdomen and pelvis was performed with the administration of intravenous contrast. Multiplanar reformatted images are provided for review. Automated exposure control, iterative reconstruction, and/or weight based adjustment of the mA/kV was utilized to reduce the radiation dose to as low as reasonably achievable. CONTRAST: 100 mL Iohexol 300. COMPARISON: PET  CT dated 09/25/2023. CLINICAL HISTORY: Staging ovarian cancer assess response to chemo. FINDINGS: CHEST: MEDIASTINUM AND LYMPH NODES: Right chest port terminates in the mid SVC. Heart and pericardium are unremarkable. The central airways are clear. No mediastinal, hilar or axillary lymphadenopathy. LUNGS AND PLEURA: No focal consolidation or pulmonary edema. No pleural effusion or pneumothorax. ABDOMEN AND PELVIS: LIVER: The  liver is unremarkable. GALLBLADDER AND BILE DUCTS: Cholelithiasis, without associated inflammatory changes. No biliary ductal dilatation. SPLEEN: No acute abnormality. PANCREAS: No acute abnormality. ADRENAL GLANDS: No acute abnormality. KIDNEYS, URETERS AND BLADDER: No stones in the kidneys or ureters. No hydronephrosis. No perinephric or periureteral stranding. Urinary bladder is unremarkable. GI AND BOWEL: Stomach demonstrates no acute abnormality. There is no bowel obstruction. Moderate colonic stool burden, suggesting mild constipation. REPRODUCTIVE ORGANS: Uterus is within normal limits. Bilateral ovaries are unremarkable. PERITONEUM AND RETROPERITONEUM: Omental caking peritoneal disease beneath the anterior abdominal wall (image 85), improved. Prior moderate abdominopelvic ascites has resolved. No free air. VASCULATURE: Aorta is normal in caliber. ABDOMINAL AND PELVIS LYMPH NODES: No lymphadenopathy. BONES AND SOFT TISSUES: Mild degenerative changes of the visualized thoracolumbar spine. No acute osseous abnormality. No focal soft tissue abnormality. IMPRESSION: 1. Improved omental caking/peritoneal disease, compatible with partial treatment response. 2. Resolution of prior moderate abdominopelvic ascites. 3. No metastatic disease in the chest. Electronically signed by: Pinkie Pebbles MD 12/08/2023 07:54 PM EST RP Workstation: [id number]       C3 carbo/taxol  on 11/27/23  Interval History: Patient reports overall doing well.  Feeling better after her most recent treatment.  Got a  little constipated when she forgot to take MiraLAX .  Reports significant improvement on medication for OAB.  Can now urinate for the first time in many years sitting up, still has to lean to finish the void.  Sometimes has some soreness in her abdomen, denies any abdominal pain today.  Reports a good appetite without nausea or emesis.  Past Medical/Surgical History: 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   History of chlamydia    1990   Multiple thyroid  nodules    Prediabetes    Sleep apnea    does not use CPAP due to voiding hourly every night    Past Surgical History:  Procedure Laterality Date   INTRAUTERINE DEVICE INSERTION  07/18/2008   MIRENA   IR IMAGING GUIDED PORT INSERTION  10/17/2023   IR PARACENTESIS  10/01/2023   IR PARACENTESIS  10/06/2023   IR PARACENTESIS  10/17/2023   IR PARACENTESIS  10/23/2023   SALIVARY GLAND SURGERY     ABSCESS   URETHRAL SLING  1999   WREN    Family History  Problem Relation Age of Onset   Colon cancer Mother 76   Hypertension Mother    Diabetes Mother    Hypertension Father    Diabetes Father    Lung cancer Father 60 - 22       brain mets   Other Sister        hypoglycemic   Colon polyps Sister    Depression Brother    Skin cancer Maternal Uncle    Breast cancer Paternal Aunt        dx >50   Uterine cancer Maternal Grandmother        dx>50 y.o.   Autism spectrum disorder Son     Social History   Socioeconomic History   Marital status: Married    Spouse name: Not on file   Number of children: Not on file   Years of education: Not on file   Highest education level: Not on file  Occupational History   Not on file  Tobacco Use   Smoking status: Never   Smokeless tobacco: Never  Vaping Use   Vaping status: Never Used  Substance and Sexual Activity   Alcohol use: Not Currently  Drug use: No   Sexual activity: Yes    Birth control/protection: Post-menopausal    Comment: husband  with vasectomy  Other Topics Concern   Not on file  Social History Narrative   Not on file   Social Drivers of Health   Financial Resource Strain: Not on file  Food Insecurity: No Food Insecurity (10/14/2023)   Hunger Vital Sign    Worried About Running Out of Food in the Last Year: Never true    Ran Out of Food in the Last Year: Never true  Transportation Needs: No Transportation Needs (10/14/2023)   PRAPARE - Administrator, Civil Service (Medical): No    Lack of Transportation (Non-Medical): No  Physical Activity: Not on file  Stress: Not on file  Social Connections: Socially Integrated (10/14/2023)   Social Connection and Isolation Panel    Frequency of Communication with Friends and Family: More than three times a week    Frequency of Social Gatherings with Friends and Family: More than three times a week    Attends Religious Services: More than 4 times per year    Active Member of [employer] or Organizations: Yes    Attends Banker Meetings: 1 to 4 times per year    Marital Status: Married    Current Medications:  Current Outpatient Medications:    dexamethasone  (DECADRON ) 4 MG tablet, Take 2 tabs by mouth at the night before and 2 tab the morning of chemotherapy, every 3 weeks, by mouth x 6 cycles, Disp: 24 tablet, Rfl: 6   lidocaine -prilocaine  (EMLA ) cream, Apply to affected area once, Disp: 30 g, Rfl: 3   metFORMIN (GLUCOPHAGE) 500 MG tablet, Take 1 tablet (500 mg total) by mouth 2 (two) times daily with a meal., Disp: 60 tablet, Rfl: 1   omeprazole (PRILOSEC) 40 MG capsule, Take 40 mg by mouth every morning., Disp: , Rfl:    ondansetron  (ZOFRAN ) 8 MG tablet, Take 1 tablet (8 mg total) by mouth every 8 (eight) hours as needed for nausea or vomiting., Disp: 60 tablet, Rfl: 1   polyethylene glycol (MIRALAX  / GLYCOLAX ) 17 g packet, Take 17 g by mouth daily., Disp: 14 each, Rfl: 0   prochlorperazine  (COMPAZINE ) 10 MG tablet, Take 1 tablet (10 mg total) by  mouth every 6 (six) hours as needed for nausea or vomiting., Disp: 30 tablet, Rfl: 1   Trospium Chloride 60 MG CP24, Take 1 capsule (60 mg total) by mouth daily., Disp: 30 capsule, Rfl: 2  Review of Systems: Denies appetite changes, fevers, chills, fatigue, unexplained weight changes. Denies hearing loss, neck lumps or masses, mouth sores, ringing in ears or voice changes. Denies cough or wheezing.  Denies shortness of breath. Denies chest pain or palpitations. Denies leg swelling. Denies abdominal distention, pain, blood in stools, constipation, diarrhea, nausea, vomiting, or early satiety. Denies pain with intercourse, dysuria, frequency, hematuria or incontinence. Denies hot flashes, pelvic pain, vaginal bleeding or vaginal discharge.   Denies joint pain, back pain or muscle pain/cramps. Denies itching, rash, or wounds. Denies dizziness, headaches, numbness or seizures. Denies swollen lymph nodes or glands, denies easy bruising or bleeding. Denies anxiety, depression, confusion, or decreased concentration.  Physical Exam: BP (!) 141/72 (BP Location: Left Arm, Patient Position: Sitting)   Pulse 96   Temp 98.3 F (36.8 C) (Oral)   Resp 16   Wt 172 lb (78 kg)   SpO2 100%   BMI 31.46 kg/m  General: Alert, oriented, no acute distress. HEENT:  Posterior oropharynx clear, sclera anicteric. Chest: Clear to auscultation bilaterally.  No wheezes or rhonchi.  Port site clean. Cardiovascular: Regular rate and rhythm, no murmurs. Abdomen: soft, nontender.  Normoactive bowel sounds.  No masses or hepatosplenomegaly appreciated.   Extremities: Grossly normal range of motion.  Warm, well perfused.  No edema bilaterally. Skin: No rashes or lesions noted. Lymphatics: No cervical, supraclavicular, or inguinal adenopathy. GU: Normal appearing external genitalia without erythema, excoriation, or lesions.  Bimanual exam reveals small mobile uterus, no definitive nodularity within the cul-de-sac.   Rectovaginal exam confirms finding, no tethering.  Laboratory & Radiologic Studies: Component Ref Range & Units (hover) 1 mo ago (11/06/23) 1 mo ago (10/23/23) 1 mo ago (10/14/23) 2 mo ago (10/03/23)  Cancer Antigen (CA) 125 185.0 High  163.0 High  CM 164.0 High  CM 305.0 High    Assessment & Plan: SELAH KLANG is a 58 y.o. woman with Stage IIIC HGS ovarian cancer who presents for treatment planning. Now s/p 3C of NACT. Germline testing negative. CARIS: BRCA2 mutation, ER/PR +, HRD neg, PDL1 CPS 1%, P53 mutation, HER2 neg (0)  Patient is overall doing well, tolerating treatment.  Reviewed her most recent scan.  Looked at pictures together.  Overall, she has had an excellent response to treatment.  There is no ascites.  I do not see peritoneal lesion up near her right hepatic lobe.  Omental caking is smaller.  I do not definitively see carcinomatosis within the cul-de-sac and cannot appreciate any on exam today.  Plan to repeat CA125 today.  For coordinated joint surgery with urogynecology, patient is scheduled for mid December with plan for 1 additional cycle of chemotherapy prior to surgery.  We reviewed the plan for a diagnostic laparoscopy, open tumor debulking including total abdominal hysterectomy, bilateral salpingo-oophorectomy, omentectomy, and any other indicated procedures including bowel surgery.  The risks of surgery were discussed in detail and she understands these to include infection; wound separation; hernia; vaginal cuff separation, injury to adjacent organs such as bowel, bladder, blood vessels, ureters and nerves; bleeding which may require blood transfusion; anesthesia risk; thromboembolic events; possible death; unforeseen complications; possible need for re-exploration; medical complications such as heart attack, stroke, pleural effusion and pneumonia; and, if full lymphadenectomy is performed the risk of lymphedema and lymphocyst. The patient will receive DVT and  antibiotic prophylaxis as indicated. She voiced a clear understanding. She had the opportunity to ask questions. Perioperative instructions were reviewed with her. Prescriptions for post-op medications were sent to her pharmacy of choice.   Discussed need for 28 days of postoperative anticoagulation.  I will reach back out to Dr. Guadlupe to let her know that we will proceed with surgery as scheduled here.  Plan for bowel prep in the event that she needs colon resection.  32 minutes of total time was spent for this patient encounter, including preparation, face-to-face counseling with the patient and coordination of care, and documentation of the encounter.  Comer Dollar, MD  Division of Gynecologic Oncology  Department of Obstetrics and Gynecology  [facility]

## 2023-12-11 NOTE — Telephone Encounter (Signed)
 Received VM from pt's spouse and message from research RN that pt/spouse had questions about insurance and [employer].  Attempted to call Mr. Gambrell back. No answer. Left VM answering the two questions and encouraged him to call back with any other questions.   Terecia Plaut E Arbell Wycoff, LCSW

## 2023-12-11 NOTE — Telephone Encounter (Signed)
 Miranda Hill called and said she needs to find a new PCP because her PCP at [facility] does not accept Medicaid.  Advised she can go on Mychart and schedule an appointment with a PCP at [facility] at a location that is convenient for her.  She verbalized understanding and will call back if she had any questions.

## 2023-12-11 NOTE — Progress Notes (Signed)
 Patient here for a follow up with Dr. Viktoria and for a pre-operative appointment prior to her scheduled surgery on 01/14/2024. She is scheduled for a diagnostic laparoscopy, robotic assisted laparoscopic versus open total hysterectomy, bilateral salpingo-oophorectomy, omentectomy, debulking, possible bowel surgery including possible ostomy.. The surgery was discussed in detail.  See after visit summary for additional details.    Discussed post-op pain management in detail including the aspects of the enhanced recovery pathway.  Advised her that a new prescription would be sent and it is only to be used for after her upcoming surgery.  We discussed the use of tylenol  post-op and to monitor for a maximum of 4,000 mg in a 24 hour period.  Also discussed that sennakot will be prescribed to be used after surgery and to hold if having loose stools.  Discussed bowel prep and regimen in detail.     Discussed the use of SCDs and measures to take at home to prevent DVT including frequent mobility.  Reportable signs and symptoms of DVT discussed. Post-operative instructions discussed and expectations for after surgery. Incisional care discussed as well including reportable signs and symptoms including erythema, drainage, wound separation.     30 minutes spent with the patient.  Verbalizing understanding of material discussed. No needs or concerns voiced at the end of the visit.   Advised patient to call for any needs.  Advised that her post-operative medications had been prescribed and could be picked up at any time.    This appointment is included in the global surgical bundle as pre-operative teaching and has no charge.

## 2023-12-11 NOTE — Telephone Encounter (Signed)
 Pt's spouse called back. Discussed questions about Medicaid and he will contact [facility] Medicaid directly for further clarification on if it is retroactive.  Also discussed applying for financial assistance through the billing department for help with previous bills and/or setting up payment plans.  No further questions at this time.   Bertina Guthridge E Julia Alkhatib, LCSW

## 2023-12-12 ENCOUNTER — Telehealth: Payer: Self-pay

## 2023-12-12 ENCOUNTER — Other Ambulatory Visit: Payer: Self-pay | Admitting: Gynecologic Oncology

## 2023-12-12 ENCOUNTER — Encounter: Payer: Self-pay | Admitting: Gynecologic Oncology

## 2023-12-12 ENCOUNTER — Encounter: Payer: Self-pay | Admitting: Hematology and Oncology

## 2023-12-12 DIAGNOSIS — C569 Malignant neoplasm of unspecified ovary: Secondary | ICD-10-CM

## 2023-12-12 LAB — CA 125: Cancer Antigen (CA) 125: 36.7 U/mL (ref 0.0–38.1)

## 2023-12-12 MED ORDER — ERYTHROMYCIN BASE 500 MG PO TABS: ORAL_TABLET | ORAL | 0 refills | Status: DC

## 2023-12-12 MED ORDER — BISACODYL 5 MG PO TBEC: DELAYED_RELEASE_TABLET | ORAL | 0 refills | Status: DC

## 2023-12-12 MED ORDER — POLYETHYLENE GLYCOL 3350 17 GM/SCOOP PO POWD: ORAL | 0 refills | Status: AC

## 2023-12-12 MED ORDER — NEOMYCIN SULFATE 500 MG PO TABS: ORAL_TABLET | ORAL | 0 refills | Status: DC

## 2023-12-12 NOTE — Research (Signed)
 [id number]- Effectiveness of Out-of Pocket Cost Communication and Financial Navigation (CostCOM) in Cancer Patients:    This Nurse has reviewed this patient's inclusion and exclusion criteria as a second review and confirms Miranda Hill is eligible for study participation.  Patient may continue with enrollment.  Cherylyn Hoard, BSN, RN, [employer] Research Nurse II [phone] 12/12/2023

## 2023-12-12 NOTE — Telephone Encounter (Signed)
 Is Corean willing to work patient in on a Friday? Patient is currently scheduled for March.  Copied from CRM #[id number]. Topic: Appointments - Scheduling Inquiry for Clinic >> Dec 12, 2023  2:03 PM Sasha M wrote: Reason for CRM: This is a new pt to our practice and I have added her to the wait list but she wanted to see if an acception could be made due to her circumstances to get her in sooner. Her current pcp had an emergency retirement and she was diagnosed with stage 3 ovarian cancer in August. She is currently doing chemo treatments and has surgery scheduled in December. If any accommodations could be made to the schedule to get her in sooner for continuity of care, please reach out via mychart or phone number on file.

## 2023-12-15 ENCOUNTER — Encounter: Payer: Self-pay | Admitting: Hematology and Oncology

## 2023-12-15 ENCOUNTER — Encounter: Payer: Self-pay | Admitting: *Deleted

## 2023-12-15 ENCOUNTER — Telehealth: Payer: Self-pay | Admitting: Oncology

## 2023-12-15 DIAGNOSIS — C569 Malignant neoplasm of unspecified ovary: Secondary | ICD-10-CM

## 2023-12-15 NOTE — Research (Unsigned)
 Effectiveness of Out-of-Pocket Psychologist, Forensic (CostCOM) in Cancer Patients   This Nurse has reviewed this patient's inclusion and exclusion criteria and confirmed Miranda Hill is still eligible for study participation. Eligibility confirmed by treating investigator, Dr. Lonn. Dr.Gorsuch also agrees that the patient still meets all inclusion criteria for STEP 1 enrollment and the research nurse should proceed with enrollment.    Miranda Hill, was registered to the above listed study, assigned study #[id number]. Randomization is required for this study. Randomization information to follow.     Miranda Hill is randomized to Arm B.  Stratification criteria were confirmed by this clinical research Nurse and clinical research Nurse, Cherylyn Hoard, verified the stratification factor (metastatic) used for randomization.  As part of the assigned treatment arm, patient Miranda Hill will receive ARM B- REAL registration CostCOM. MD was notified of patient assignment.  Per study protocol, patient Miranda Hill will be notified of her randomized assignment. Patient Miranda Hill is successfully enrolled in the above study.   Miranda Larsen, RN, BSN Clinical Research Nurse [phone] 12/15/2023

## 2023-12-15 NOTE — Telephone Encounter (Signed)
 Called patient and scheduled her for 12/5 at 1:00 in a 40 minute time slot. She is extremely grateful to be worked in.

## 2023-12-15 NOTE — Telephone Encounter (Signed)
 Miranda Hill and reviewed updated bowel prep instructions for the 01/14/2024 surgery with Dr. Viktoria.  Miranda Hill said she saw the new instructions and was able to pick up the new prescriptions from [facility] except for erythromycin which had to be ordered.  Also discussed that Dr. Viktoria will be using the laparoscope first to look and see if she can do the surgery.  It will be open if she is able to do the surgery.  Miranda Hill verbalized understanding and didn't have any questions.

## 2023-12-15 NOTE — Research (Signed)
  Effectiveness of Out-of-Pocket Psychologist, Forensic (CostCOM) in Cancer Patients      Naje Rice, was registered to STEP 0 for above listed study.  PID # [id number] was assigned to the patient.  The Participant Contact Information Sheet was successfully faxed to [facility].  The registering nurse received an email from the CostCOM study with a link to complete contact preferences.  This nurse completed the Patient Contact Preferences Form.  Dr. Lonn, pt's treating physician, was notified of the pt's STEP 0 registration.   Mazie Larsen, RN, BSN Clinical Research Nurse [phone] 12/12/2023

## 2023-12-16 ENCOUNTER — Encounter: Payer: Self-pay | Admitting: *Deleted

## 2023-12-16 ENCOUNTER — Encounter: Payer: Self-pay | Admitting: Physical Therapy

## 2023-12-16 ENCOUNTER — Encounter: Payer: Self-pay | Admitting: Hematology and Oncology

## 2023-12-16 ENCOUNTER — Ambulatory Visit: Admitting: Physical Therapy

## 2023-12-16 DIAGNOSIS — R279 Unspecified lack of coordination: Secondary | ICD-10-CM

## 2023-12-16 DIAGNOSIS — M6281 Muscle weakness (generalized): Secondary | ICD-10-CM

## 2023-12-16 DIAGNOSIS — C569 Malignant neoplasm of unspecified ovary: Secondary | ICD-10-CM

## 2023-12-16 DIAGNOSIS — R293 Abnormal posture: Secondary | ICD-10-CM

## 2023-12-16 DIAGNOSIS — M62838 Other muscle spasm: Secondary | ICD-10-CM

## 2023-12-16 NOTE — Research (Signed)
 Effectiveness of Out-of-Pocket Psychologist, Forensic (CostCOM) in Cancer Patients   Spoke with pt this afternoon to inform her that she has been randomized to Arm B- [employer] arm for the above study. She is scheduled for her initial interview with [employer] on December 2nd, 2025 @ 10:30am. Pt understands that they will call her to conduct the interview. She has been given their phone number and knows to pick up their phone call. She verbalized understanding and did not have any questions at this time.   Miranda Larsen, RN, BSN Clinical Research Nurse [phone] 12/16/2023
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
## 2023-12-17 NOTE — Telephone Encounter (Signed)
 Spoke with Wanda at Prentice Batch, NP office. They are working on the surgical optimization form

## 2023-12-18 ENCOUNTER — Inpatient Hospital Stay: Admitting: Hematology and Oncology

## 2023-12-18 ENCOUNTER — Inpatient Hospital Stay

## 2023-12-18 ENCOUNTER — Other Ambulatory Visit: Payer: Self-pay

## 2023-12-18 ENCOUNTER — Encounter: Payer: Self-pay | Admitting: Hematology and Oncology

## 2023-12-18 ENCOUNTER — Inpatient Hospital Stay (HOSPITAL_BASED_OUTPATIENT_CLINIC_OR_DEPARTMENT_OTHER): Admitting: Hematology and Oncology

## 2023-12-18 VITALS — BP 148/87 | HR 108 | Temp 99.2°F | Resp 18 | Wt 174.8 lb

## 2023-12-18 DIAGNOSIS — C786 Secondary malignant neoplasm of retroperitoneum and peritoneum: Secondary | ICD-10-CM | POA: Diagnosis not present

## 2023-12-18 DIAGNOSIS — C569 Malignant neoplasm of unspecified ovary: Secondary | ICD-10-CM

## 2023-12-18 DIAGNOSIS — D61818 Other pancytopenia: Secondary | ICD-10-CM

## 2023-12-18 DIAGNOSIS — Z5111 Encounter for antineoplastic chemotherapy: Secondary | ICD-10-CM | POA: Diagnosis not present

## 2023-12-18 DIAGNOSIS — C563 Malignant neoplasm of bilateral ovaries: Secondary | ICD-10-CM | POA: Diagnosis not present

## 2023-12-18 LAB — CMP (CANCER CENTER ONLY)
ALT: 24 U/L (ref 0–44)
AST: 21 U/L (ref 15–41)
Albumin: 4.6 g/dL (ref 3.5–5.0)
Alkaline Phosphatase: 100 U/L (ref 38–126)
Anion gap: 14 (ref 5–15)
BUN: 18 mg/dL (ref 6–20)
CO2: 24 mmol/L (ref 22–32)
Calcium: 9.6 mg/dL (ref 8.9–10.3)
Chloride: 100 mmol/L (ref 98–111)
Creatinine: 0.58 mg/dL (ref 0.44–1.00)
GFR, Estimated: >60 mL/min (ref >60)
Glucose, Bld: 187 mg/dL — ABNORMAL HIGH (ref 70–99)
Potassium: 4.4 mmol/L (ref 3.5–5.1)
Sodium: 138 mmol/L (ref 135–145)
Total Bilirubin: 0.5 mg/dL (ref 0.0–1.2)
Total Protein: 7.4 g/dL (ref 6.5–8.1)

## 2023-12-18 LAB — CBC WITH DIFFERENTIAL (CANCER CENTER ONLY)
Abs Immature Granulocytes: 0.07 K/uL (ref 0.00–0.07)
Basophils Absolute: 0 K/uL (ref 0.0–0.1)
Basophils Relative: 0 %
Eosinophils Absolute: 0 K/uL (ref 0.0–0.5)
Eosinophils Relative: 0 %
HCT: 32.9 % — ABNORMAL LOW (ref 36.0–46.0)
Hemoglobin: 10.9 g/dL — ABNORMAL LOW (ref 12.0–15.0)
Immature Granulocytes: 1 %
Lymphocytes Relative: 7 %
Lymphs Abs: 0.5 K/uL — ABNORMAL LOW (ref 0.7–4.0)
MCH: 28.5 pg (ref 26.0–34.0)
MCHC: 33.1 g/dL (ref 30.0–36.0)
MCV: 86.1 fL (ref 80.0–100.0)
Monocytes Absolute: 0 K/uL — ABNORMAL LOW (ref 0.1–1.0)
Monocytes Relative: 1 %
Neutro Abs: 7.4 K/uL (ref 1.7–7.7)
Neutrophils Relative %: 91 %
Platelet Count: 149 K/uL — ABNORMAL LOW (ref 150–400)
RBC: 3.82 MIL/uL — ABNORMAL LOW (ref 3.87–5.11)
RDW: 18.8 % — ABNORMAL HIGH (ref 11.5–15.5)
WBC Count: 8.1 K/uL (ref 4.0–10.5)
nRBC: 0 % (ref 0.0–0.2)

## 2023-12-18 MED ORDER — SODIUM CHLORIDE 0.9 % IV SOLN: INTRAVENOUS | Status: DC

## 2023-12-18 MED ORDER — SODIUM CHLORIDE 0.9 % IV SOLN
693.0000 mg | Freq: Once | INTRAVENOUS | Status: AC
  Administered 2023-12-18: 690 mg via INTRAVENOUS
  Filled 2023-12-18: qty 69

## 2023-12-18 MED ORDER — DIPHENHYDRAMINE HCL 50 MG/ML IJ SOLN
25.0000 mg | Freq: Once | INTRAMUSCULAR | Status: AC
  Administered 2023-12-18: 25 mg via INTRAVENOUS
  Filled 2023-12-18: qty 1

## 2023-12-18 MED ORDER — PALONOSETRON HCL INJECTION 0.25 MG/5ML
0.2500 mg | Freq: Once | INTRAVENOUS | Status: AC
  Administered 2023-12-18: 0.25 mg via INTRAVENOUS
  Filled 2023-12-18: qty 5

## 2023-12-18 MED ORDER — SODIUM CHLORIDE 0.9 % IV SOLN
175.0000 mg/m2 | Freq: Once | INTRAVENOUS | Status: AC
  Administered 2023-12-18: 318 mg via INTRAVENOUS
  Filled 2023-12-18: qty 53

## 2023-12-18 MED ORDER — CETIRIZINE HCL 10 MG/ML IV SOLN
10.0000 mg | Freq: Once | INTRAVENOUS | Status: AC
  Administered 2023-12-18: 10 mg via INTRAVENOUS
  Filled 2023-12-18: qty 1

## 2023-12-18 MED ORDER — FAMOTIDINE IN NACL 20-0.9 MG/50ML-% IV SOLN
20.0000 mg | Freq: Once | INTRAVENOUS | Status: AC
  Administered 2023-12-18: 20 mg via INTRAVENOUS
  Filled 2023-12-18: qty 50

## 2023-12-18 MED ORDER — APREPITANT 130 MG/18ML IV EMUL
130.0000 mg | Freq: Once | INTRAVENOUS | Status: AC
  Administered 2023-12-18: 130 mg via INTRAVENOUS
  Filled 2023-12-18: qty 18

## 2023-12-18 MED ORDER — DEXAMETHASONE SOD PHOSPHATE PF 10 MG/ML IJ SOLN
10.0000 mg | Freq: Once | INTRAMUSCULAR | Status: AC
  Administered 2023-12-18: 10 mg via INTRAVENOUS

## 2023-12-18 NOTE — Assessment & Plan Note (Addendum)
 She is pancytopenic due to treatment but not symptomatic Observe only

## 2023-12-18 NOTE — Patient Instructions (Signed)
 [facility] - A DEPT OF [facility]  Discharge Instructions: Thank you for choosing [facility] to provide your oncology and hematology care.   If you have a lab appointment with the Cancer Center, please go directly to the Cancer Center and check in at the registration area.   Wear comfortable clothing and clothing appropriate for easy access to any Portacath or PICC line.   We strive to give you quality time with your provider. You may need to reschedule your appointment if you arrive late (15 or more minutes).  Arriving late affects you and other patients whose appointments are after yours.  Also, if you miss three or more appointments without notifying the office, you may be dismissed from the clinic at the provider's discretion.      For prescription refill requests, have your pharmacy contact our office and allow 72 hours for refills to be completed.    Today you received the following chemotherapy and/or immunotherapy agents paclitaxel , carboplatin       To help prevent nausea and vomiting after your treatment, we encourage you to take your nausea medication as directed.  BELOW ARE SYMPTOMS THAT SHOULD BE REPORTED IMMEDIATELY: *FEVER GREATER THAN 100.4 F (38 C) OR HIGHER *CHILLS OR SWEATING *NAUSEA AND VOMITING THAT IS NOT CONTROLLED WITH YOUR NAUSEA MEDICATION *UNUSUAL SHORTNESS OF BREATH *UNUSUAL BRUISING OR BLEEDING *URINARY PROBLEMS (pain or burning when urinating, or frequent urination) *BOWEL PROBLEMS (unusual diarrhea, constipation, pain near the anus) TENDERNESS IN MOUTH AND THROAT WITH OR WITHOUT PRESENCE OF ULCERS (sore throat, sores in mouth, or a toothache) UNUSUAL RASH, SWELLING OR PAIN  UNUSUAL VAGINAL DISCHARGE OR ITCHING   Items with * indicate a potential emergency and should be followed up as soon as possible or go to the Emergency Department if any problems should occur.  Please show the CHEMOTHERAPY ALERT CARD or  IMMUNOTHERAPY ALERT CARD at check-in to the Emergency Department and triage nurse.  Should you have questions after your visit or need to cancel or reschedule your appointment, please contact [facility] - A DEPT OF [facility]  Dept: [phone]  and follow the prompts.  Office hours are 8:00 a.m. to 4:30 p.m. Monday - Friday. Please note that voicemails left after 4:00 p.m. may not be returned until the following business day.  We are closed weekends and major holidays. You have access to a nurse at all times for urgent questions. Please call the main number to the clinic Dept: [phone] and follow the prompts.   For any non-urgent questions, you may also contact your provider using MyChart. We now offer e-Visits for anyone 25 and older to request care online for non-urgent symptoms. For details visit [email or web address].   Also download the MyChart app! Go to the app store, search MyChart, open the app, select [place], and log in with your MyChart username and password.

## 2023-12-18 NOTE — Assessment & Plan Note (Addendum)
 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  Germline mutation testing was negative  She received cycle 1 of treatment on September 18 with good clinical response Her chronic leukocytosis had resolved Her malignant hypercalcemia has resolved She is eating better and ascites are resolving She tolerated cycle 2 of treatment well without major side effects With treatment today which is cycle 3, she had slight reaction with back pain with initial infusion of paclitaxel  but that has subsequently resolved and she is able to tolerate the rest of the treatment without major problems.  She was evaluated twice in the infusion room Recent repeat imaging study and tumor markers show excellent response to therapy Overall, she tolerated cycle 3 of therapy well without major side effects We will proceed with cycle 4 of treatment today After that, she will take treatment break for surgery and she will resume approximately 1 month after surgery for adjuvant treatment

## 2023-12-18 NOTE — Progress Notes (Signed)
 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  Germline mutation testing was negative  She received cycle 1 of treatment on September 18 with good clinical response Her chronic leukocytosis had resolved Her malignant hypercalcemia has resolved She is eating better and ascites are resolving She tolerated cycle 2 of treatment well without major side effects With treatment today which is cycle 3, she had slight reaction with back pain with initial infusion of paclitaxel  but that has subsequently resolved and she is able to tolerate the rest of the treatment without major problems.  She was evaluated twice in the infusion room Recent repeat imaging study and tumor markers show excellent response to therapy Overall, she tolerated cycle 3 of therapy well without major side effects We will proceed with cycle 4 of treatment today After that, she will take treatment break for surgery and she will resume approximately 1 month after surgery for adjuvant treatment Pancytopenia, [name] She is pancytopenic due to treatment but not symptomatic Observe  only  No orders of the defined types were placed in this encounter.    Miranda Bedford, MD  INTERVAL HISTORY: she returns for treatment follow-up Complications related to previous cycle of chemotherapy included pancytopenia,  PHYSICAL EXAMINATION: ECOG PERFORMANCE STATUS: 0 - Asymptomatic  Lab Results  Component Value Date   CAN125 36.7 12/11/2023   CAN125 185.0 (H) 11/06/2023   CAN125 163.0 (H) 10/23/2023      Latest Ref Rng & Units 12/18/2023    9:38 AM 11/27/2023    7:36 AM 11/06/2023   10:59 AM  CBC  WBC 4.0 - 10.5 K/uL 8.1  11.5  7.7   Hemoglobin 12.0 - 15.0 g/dL 89.0  88.7  89.7   Hematocrit 36.0 - 46.0 % 32.9  33.7  32.7   Platelets 150 - 400 K/uL 149  170  279       Chemistry      Component Value Date/Time   NA 138 12/18/2023 0938   K 4.4 12/18/2023 0938   CL 100 12/18/2023 0938   CO2 24 12/18/2023 0938   BUN 18 12/18/2023 0938   CREATININE 0.58 12/18/2023 0938   CREATININE 0.78 02/18/2018 1553      Component Value Date/Time   CALCIUM 9.6 12/18/2023 0938   ALKPHOS 100 12/18/2023 0938   AST 21 12/18/2023 0938   ALT 24 12/18/2023 0938   BILITOT 0.5 12/18/2023 0938       There were no vitals filed for this visit. There were no vitals filed for this visit. Other relevant data reviewed during this visit included CBC, CMP, CA125, CT imaging from November 2025

## 2023-12-19 ENCOUNTER — Other Ambulatory Visit: Payer: Self-pay | Admitting: Hematology and Oncology

## 2023-12-22 ENCOUNTER — Encounter: Payer: Self-pay | Admitting: Hematology and Oncology

## 2023-12-22 ENCOUNTER — Ambulatory Visit: Admitting: Obstetrics and Gynecology

## 2023-12-22 NOTE — Telephone Encounter (Signed)
 Received PCP clearance.

## 2023-12-30 ENCOUNTER — Ambulatory Visit: Attending: Obstetrics | Admitting: Physical Therapy

## 2023-12-30 DIAGNOSIS — M6281 Muscle weakness (generalized): Secondary | ICD-10-CM | POA: Insufficient documentation

## 2023-12-30 DIAGNOSIS — R279 Unspecified lack of coordination: Secondary | ICD-10-CM | POA: Insufficient documentation

## 2023-12-30 DIAGNOSIS — R293 Abnormal posture: Secondary | ICD-10-CM | POA: Insufficient documentation

## 2023-12-30 DIAGNOSIS — M62838 Other muscle spasm: Secondary | ICD-10-CM | POA: Diagnosis present
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
## 2023-12-31 ENCOUNTER — Other Ambulatory Visit (HOSPITAL_COMMUNITY): Payer: Self-pay

## 2023-12-31 ENCOUNTER — Ambulatory Visit: Admitting: Obstetrics and Gynecology

## 2023-12-31 ENCOUNTER — Encounter: Payer: Self-pay | Admitting: Obstetrics and Gynecology

## 2023-12-31 VITALS — BP 135/84 | HR 102 | Ht 62.0 in | Wt 175.4 lb

## 2023-12-31 DIAGNOSIS — Z01818 Encounter for other preprocedural examination: Secondary | ICD-10-CM

## 2023-12-31 MED ORDER — ONDANSETRON HCL 4 MG PO TABS
4.0000 mg | ORAL_TABLET | Freq: Three times a day (TID) | ORAL | 0 refills | Status: AC | PRN
  Filled 2023-12-31: qty 10, 4d supply, fill #0

## 2023-12-31 MED ORDER — POLYETHYLENE GLYCOL 3350 17 GM/SCOOP PO POWD
17.0000 g | Freq: Every day | ORAL | 0 refills | Status: DC
  Filled 2023-12-31: qty 238, 14d supply, fill #0

## 2023-12-31 MED ORDER — OXYCODONE HCL 5 MG PO TABS
5.0000 mg | ORAL_TABLET | ORAL | 0 refills | Status: DC | PRN
  Filled 2023-12-31: qty 15, 3d supply, fill #0

## 2023-12-31 NOTE — H&P (Cosign Needed)
  Urogynecology Urogyn H&P  Subjective Chief Complaint: Miranda Hill presents for a preoperative encounter.   History of Present Illness: Miranda Hill is a 58 y.o. female who presents for preoperative visit.  She is scheduled to undergo Exam under anesthesia, vaginal vault suspension, anterior and posterior repair, perineoplasty, and Urethral sling, and cystoscopy  on 01/14/24.  Her symptoms include pelvic organ prolapse and stress urinary incontinence, and she was was found to have Stage II anterior, Stage I posterior, Stage III apical prolapse.   Urodynamics showed: 1. Sensation was normal; capacity was normal 2. Stress Incontinence was not demonstrated at normal pressures; 3. Detrusor Overactivity was demonstrated without leakage. 4. Emptying was dysfunctional with a elevated PVR, a sustained detrusor contraction not present,  abdominal straining present, dyssynergic urethral sphincter activity on EMG.  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   Family History  Problem Relation Age of Onset   Colon cancer Mother 36   Hypertension Mother    Diabetes Mother    Hypertension Father    Diabetes Father    Lung cancer Father 44 - 33       brain mets   Other Sister        hypoglycemic   Colon polyps Sister    Depression Brother    Skin cancer Maternal Uncle    Breast cancer  Paternal Aunt        dx >50   Uterine cancer Maternal Grandmother        dx>50 y.o.   Autism spectrum disorder Son     Social History   Tobacco Use   Smoking status: Never   Smokeless tobacco: Never  Vaping Use   Vaping status: Never Used  Substance Use Topics   Alcohol use: Not Currently   Drug use: No     Review of Systems was negative for a full 10 system review except as noted in the History of Present Illness.  No current facility-administered medications for this encounter.  Current Outpatient Medications:    metFORMIN  (GLUCOPHAGE ) 500 MG tablet, Take 1 tablet (500 mg total) by mouth 2 (two) times daily with a meal., Disp: 60 tablet, Rfl: 1   Multiple Vitamin (MULTIVITAMIN WITH MINERALS) TABS tablet, Take 1 tablet by mouth once a week. Life Extension Two Per Day Multivitamin, Disp: , Rfl:    omeprazole  (PRILOSEC) 40 MG capsule, Take 40 mg by mouth daily before breakfast., Disp: , Rfl:    ondansetron  (ZOFRAN ) 8 MG tablet, Take 1 tablet (8 mg total) by mouth every 8 (eight) hours as needed for nausea or vomiting., Disp: 60 tablet, Rfl: 1   polyethylene glycol (MIRALAX  / GLYCOLAX ) 17 g packet, Take 17 g by mouth daily., Disp: 14 each, Rfl: 0  senna (SENOKOT) 8.6 MG TABS tablet, Take 1 tablet by mouth daily., Disp: , Rfl:    Trospium  Chloride 60 MG CP24, Take 1 capsule (60 mg total) by mouth daily., Disp: 30 capsule, Rfl: 2   [START ON 01/13/2024] bisacodyl  (DULCOLAX) 5 MG EC tablet, The day before surgery starting at 9:00 am, take 4 Dulcolax tablets with 2 glasses of clear liquid., Disp: 4 tablet, Rfl: 0   dexamethasone  (DECADRON ) 4 MG tablet, Take 2 tabs by mouth at the night before and 2 tab the morning of chemotherapy, every 3 weeks, by mouth x 6 cycles, Disp: 24 tablet, Rfl: 6   [START ON 01/13/2024] erythromycin  base (E-MYCIN ) 500 MG tablet, The day before surgery, plan to take 2 tablets (1000 mg total) at 2pm, 3 pm, and 10 pm (Patient not taking: Reported on 12/31/2023),  Disp: 6 tablet, Rfl: 0   lidocaine -prilocaine  (EMLA ) cream, Apply to affected area once (Patient not taking: Reported on 12/31/2023), Disp: 30 g, Rfl: 3   [START ON 01/13/2024] neomycin  (MYCIFRADIN ) 500 MG tablet, The day before surgery, plan to take 2 tablets (1000 mg total) at 2pm, 3 pm, and 10 pm, Disp: 6 tablet, Rfl: 0   ondansetron  (ZOFRAN ) 4 MG tablet, Take 1 tablet (4 mg total) by mouth every 8 (eight) hours as needed for nausea or vomiting., Disp: 10 tablet, Rfl: 0   oxyCODONE  (OXY IR/ROXICODONE ) 5 MG immediate release tablet, Take 1 tablet (5 mg total) by mouth every 4 (four) hours as needed for severe pain (pain score 7-10)., Disp: 15 tablet, Rfl: 0   polyethylene glycol powder (GLYCOLAX /MIRALAX ) 17 GM/SCOOP powder, Take 17 g by mouth daily. Drink 17g (1 scoop) dissolved in water  per day., Disp: 238 g, Rfl: 0   [START ON 01/13/2024] polyethylene glycol powder (MIRALAX ) 17 GM/SCOOP powder, The day before surgery at 11:00 am, mix whole bottle of Miralax  in 64 oz of Gatorade and drink one 8 oz glass every 15 minutes until gone., Disp: 238 g, Rfl: 0   prochlorperazine  (COMPAZINE ) 10 MG tablet, Take 1 tablet (10 mg total) by mouth every 6 (six) hours as needed for nausea or vomiting. (Patient not taking: Reported on 12/31/2023), Disp: 30 tablet, Rfl: 1   Objective There were no vitals filed for this visit.   Gen: NAD CV: S1 S2 RRR Lungs: Clear to auscultation bilaterally Abd: soft, nontender   Previous Pelvic Exam showed: POP-Q   0                                            Aa   0                                           Ba   1                                              C    3  Gh   3                                            Pb   7                                            tvl    -1                                            Ap   -1                                            Bp   -1                                               D      Assessment/ Plan  Assessment: The patient is a 58 y.o. year old scheduled to undergo  Exam under anesthesia, vaginal vault suspension, anterior and posterior repair, perineoplasty, and Urethral sling, and cystoscopy . Verbal consent was obtained for these procedures.

## 2023-12-31 NOTE — Progress Notes (Signed)
 [facility] Pre-Operative Exam  Subjective Chief Complaint: Miranda Hill presents for a preoperative encounter.   History of Present Illness: Miranda Hill is a 58 y.o. female who presents for preoperative visit.  She is scheduled to undergo Exam under anesthesia, vaginal vault suspension, anterior and posterior repair, perineoplasty, and Urethral sling, and cystoscopy  on 01/14/24.  Her symptoms include pelvic organ prolapse and stress urinary incontinence, and she was was found to have Stage II anterior, Stage I posterior, Stage III apical prolapse.   Urodynamics showed: 1. Sensation was normal; capacity was normal 2. Stress Incontinence was not demonstrated at normal pressures; 3. Detrusor Overactivity was demonstrated without leakage. 4. Emptying was dysfunctional with a elevated PVR, a sustained detrusor contraction not present,  abdominal straining present, dyssynergic urethral sphincter activity on EMG.  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   Family History  Problem Relation Age of Onset   Colon cancer Mother 71   Hypertension Mother    Diabetes Mother    Hypertension Father    Diabetes Father    Lung cancer Father 63 - 36       brain mets   Other Sister        hypoglycemic   Colon polyps Sister    Depression Brother    Skin cancer Maternal Uncle    Breast  cancer Paternal Aunt        dx >50   Uterine cancer Maternal Grandmother        dx>50 y.o.   Autism spectrum disorder Son     Social History   Tobacco Use   Smoking status: Never   Smokeless tobacco: Never  Vaping Use   Vaping status: Never Used  Substance Use Topics   Alcohol use: Not Currently   Drug use: No     Review of Systems was negative for a full 10 system review except as noted in the History of Present Illness.   Current Outpatient Medications:    [START ON 01/13/2024] bisacodyl  (DULCOLAX) 5 MG EC tablet, The day before surgery starting at 9:00 am, take 4 Dulcolax tablets with 2 glasses of clear liquid., Disp: 4 tablet, Rfl: 0   dexamethasone  (DECADRON ) 4 MG tablet, Take 2 tabs by mouth at the night before and 2 tab the morning of chemotherapy, every 3 weeks, by mouth x 6 cycles, Disp: 24 tablet, Rfl: 6   metFORMIN  (GLUCOPHAGE ) 500 MG tablet, Take 1 tablet (500 mg total) by mouth 2 (two) times daily with a meal., Disp: 60 tablet, Rfl: 1   Multiple Vitamin (MULTIVITAMIN WITH MINERALS) TABS tablet, Take 1 tablet by mouth once a week. Life Extension Two Per Day Multivitamin, Disp: , Rfl:    omeprazole  (PRILOSEC) 40 MG capsule, Take 40  mg by mouth daily before breakfast., Disp: , Rfl:    ondansetron  (ZOFRAN ) 4 MG tablet, Take 1 tablet (4 mg total) by mouth every 8 (eight) hours as needed for nausea or vomiting., Disp: 10 tablet, Rfl: 0   ondansetron  (ZOFRAN ) 8 MG tablet, Take 1 tablet (8 mg total) by mouth every 8 (eight) hours as needed for nausea or vomiting., Disp: 60 tablet, Rfl: 1   oxyCODONE  (OXY IR/ROXICODONE ) 5 MG immediate release tablet, Take 1 tablet (5 mg total) by mouth every 4 (four) hours as needed for severe pain (pain score 7-10)., Disp: 15 tablet, Rfl: 0   polyethylene glycol (MIRALAX  / GLYCOLAX ) 17 g packet, Take 17 g by mouth daily., Disp: 14 each, Rfl: 0   polyethylene glycol powder (GLYCOLAX /MIRALAX ) 17 GM/SCOOP powder, Take 17 g by mouth daily. Drink 17g  (1 scoop) dissolved in water per day., Disp: 238 g, Rfl: 0   [START ON 01/13/2024] polyethylene glycol powder (MIRALAX ) 17 GM/SCOOP powder, The day before surgery at 11:00 am, mix whole bottle of Miralax  in 64 oz of Gatorade and drink one 8 oz glass every 15 minutes until gone., Disp: 238 g, Rfl: 0   senna (SENOKOT) 8.6 MG TABS tablet, Take 1 tablet by mouth daily., Disp: , Rfl:    [START ON 01/13/2024] erythromycin  base (E-MYCIN ) 500 MG tablet, The day before surgery, plan to take 2 tablets (1000 mg total) at 2pm, 3 pm, and 10 pm (Patient not taking: Reported on 12/31/2023), Disp: 6 tablet, Rfl: 0   lidocaine -prilocaine  (EMLA ) cream, Apply to affected area once (Patient not taking: Reported on 12/31/2023), Disp: 30 g, Rfl: 3   [START ON 01/13/2024] neomycin  (MYCIFRADIN ) 500 MG tablet, The day before surgery, plan to take 2 tablets (1000 mg total) at 2pm, 3 pm, and 10 pm, Disp: 6 tablet, Rfl: 0   prochlorperazine  (COMPAZINE ) 10 MG tablet, Take 1 tablet (10 mg total) by mouth every 6 (six) hours as needed for nausea or vomiting. (Patient not taking: Reported on 12/31/2023), Disp: 30 tablet, Rfl: 1   Trospium  Chloride 60 MG CP24, Take 1 capsule (60 mg total) by mouth daily., Disp: 30 capsule, Rfl: 2   Objective Vitals:   12/31/23 0828  BP: 135/84  Pulse: (!) 102    Gen: NAD CV: S1 S2 RRR Lungs: Clear to auscultation bilaterally Abd: soft, nontender   Previous Pelvic Exam showed: POP-Q   0                                            Aa   0                                           Ba   1                                              C    3  Gh   3                                            Pb   7                                            tvl    -1                                            Ap   -1                                            Bp   -1                                              D      Assessment/  Plan  Assessment: The patient is a 58 y.o. year old scheduled to undergo  Exam under anesthesia, vaginal vault suspension, anterior and posterior repair, perineoplasty, and Urethral sling, and cystoscopy . Verbal consent was obtained for these procedures.  Plan: General Surgical Consent: The patient has previously been counseled on alternative treatments, and the decision by the patient and provider was to proceed with the procedure listed above.  For all procedures, there are risks of bleeding, infection, damage to surrounding organs including but not limited to bowel, bladder, blood vessels, ureters and nerves, and need for further surgery if an injury were to occur. These risks are all low with minimally invasive surgery.   There are risks of numbness and weakness at any body site or buttock/rectal pain.  It is possible that baseline pain can be worsened by surgery, either with or without mesh. If surgery is vaginal, there is also a low risk of possible conversion to laparoscopy or open abdominal incision where indicated. Very rare risks include blood transfusion, blood clot, heart attack, pneumonia, or death.   There is also a risk of short-term postoperative urinary retention with need to use a catheter. About half of patients need to go home from surgery with a catheter, which is then later removed in the office. The risk of long-term need for a catheter is very low. There is also a risk of worsening of overactive bladder.   Sling: The effectiveness of a midurethral vaginal mesh sling is approximately 85%, and thus, there will be times when you may leak urine after surgery, especially if your bladder is full or if you have a strong cough. There is a balance between making the sling tight enough to treat your leakage but not too tight so that you have long-term difficulty emptying your bladder. A mesh sling will not directly treat overactive bladder/urge incontinence and may worsen it.  There is  an FDA safety notification on vaginal mesh procedures for prolapse but NOT mesh slings. We have extensive experience and training with mesh placement and we have close  postoperative follow up to identify any potential complications from mesh. It is important to realize that this mesh is a permanent implant that cannot be easily removed. There are rare risks of mesh exposure (2-4%), pain with intercourse (0-7%), and infection (<1%). The risk of mesh exposure if more likely in a woman with risks for poor healing (prior radiation, poorly controlled diabetes, or immunocompromised). The risk of new or worsened chronic pain after mesh implant is more common in women with baseline chronic pain and/or poorly controlled anxiety or depression. Approximately 2-4% of patients will experience longer-term post-operative voiding dysfunction that may require surgical revision of the sling. We also reviewed that postoperatively, her stream may not be as strong as before surgery.   Prolapse (with or without mesh): Risk factors for surgical failure  include things that put pressure on your pelvis and the surgical repair, including obesity, chronic cough, and heavy lifting or straining (including lifting children or adults, straining on the toilet, or lifting heavy objects such as furniture or anything weighing >25 lbs. Risks of recurrence is 20-30% with vaginal native tissue repair and a less than 10% with sacrocolpopexy with mesh.    We discussed consent for blood products. Risks for blood transfusion include allergic reactions, other reactions that can affect different body organs and managed accordingly, transmission of infectious diseases such as HIV or Hepatitis. However, the blood is screened. Patient consents for blood products.  Pre-operative instructions:  She was instructed to not take Aspirin/NSAIDs x 7days prior to surgery.  Antibiotic prophylaxis was ordered as indicated.  Catheter use: Patient will go home with  foley if needed after post-operative voiding trial.  Post-operative instructions:  She was provided with specific post-operative instructions, including precautions and signs/symptoms for which we would recommend contacting us , in addition to daytime and after-hours contact phone numbers. This was provided on a handout.   Post-operative medications: Prescriptions for zofran , miralax , and oxycodone  were sent to her pharmacy. Discussed using ibuprofen and tylenol  on a schedule to limit use of narcotics.   Laboratory testing:  We will check labs: As ordered by Dr. Lewie team  Preoperative clearance:  She does not require surgical clearance.    Post-operative follow-up:  A post-operative appointment will be made for 6 weeks from the date of surgery. If she needs a post-operative nurse visit for a voiding trial, that will be set up after she leaves the hospital.    Patient will call the clinic or use MyChart should anything change or any new issues arise.   Tou Hayner G Nechelle Petrizzo, NP

## 2024-01-02 ENCOUNTER — Encounter: Payer: Self-pay | Admitting: Hematology and Oncology

## 2024-01-02 ENCOUNTER — Other Ambulatory Visit (HOSPITAL_COMMUNITY): Payer: Self-pay

## 2024-01-02 ENCOUNTER — Encounter: Payer: Self-pay | Admitting: Family Medicine

## 2024-01-02 ENCOUNTER — Ambulatory Visit: Admitting: Family Medicine

## 2024-01-02 VITALS — BP 144/90 | HR 103 | Temp 98.7°F | Ht 62.0 in | Wt 177.4 lb

## 2024-01-02 DIAGNOSIS — G4733 Obstructive sleep apnea (adult) (pediatric): Secondary | ICD-10-CM | POA: Diagnosis not present

## 2024-01-02 DIAGNOSIS — K219 Gastro-esophageal reflux disease without esophagitis: Secondary | ICD-10-CM | POA: Diagnosis not present

## 2024-01-02 DIAGNOSIS — C569 Malignant neoplasm of unspecified ovary: Secondary | ICD-10-CM | POA: Diagnosis not present

## 2024-01-02 DIAGNOSIS — R180 Malignant ascites: Secondary | ICD-10-CM | POA: Diagnosis not present

## 2024-01-02 DIAGNOSIS — T451X5A Adverse effect of antineoplastic and immunosuppressive drugs, initial encounter: Secondary | ICD-10-CM | POA: Diagnosis not present

## 2024-01-02 DIAGNOSIS — N819 Female genital prolapse, unspecified: Secondary | ICD-10-CM | POA: Diagnosis not present

## 2024-01-02 DIAGNOSIS — G620 Drug-induced polyneuropathy: Secondary | ICD-10-CM | POA: Diagnosis not present

## 2024-01-02 MED ORDER — OMEPRAZOLE 40 MG PO CPDR
40.0000 mg | DELAYED_RELEASE_CAPSULE | Freq: Every day | ORAL | 3 refills | Status: AC
  Filled 2024-01-02: qty 90, 90d supply, fill #0

## 2024-01-02 NOTE — Progress Notes (Addendum)
 Anesthesia Review:  PCP: Corean Ku    Clearance Jodie Batch- 12/23/23 in Media Tab  Cardiologist : none   PPM/ ICD: Device Orders: Rep Notified:  Chest x-ray :   Ct Chest-12/08/23  EKG : 10/14/23  Echo : Stress test: Cardiac Cath :   Activity level: can do a flight of stairs without difficutly  Sleep Study/ CPAP : has cpap  Fasting Blood Sugar :      / Checks Blood Sugar -- times a day:     Prediabetes- does not check glucose at home   Metformin - none am of surgery   Blood Thinner/ Instructions /Last Dose: ASA / Instructions/ Last Dose :    Completed chemo on 12/18/2023    Research in to see pt at time of preop appt.   Husband with pt at preop appt.   PT and husband both state she has bowel prep instructions at home from Dr Viktoria.    PORT

## 2024-01-02 NOTE — Patient Instructions (Addendum)
 Welcome to [facility]!  Thank you for choosing us  for your Primary Care needs.   We offer in person and video appointments for your convenience. You may call our office to schedule appointments, or you may schedule appointments with me through MyChart.   The best way to get in contact with me is via MyChart message. This will get to me faster than a phone call, unless there is an emergency, then please call 911.  The lab is located downstairs in the Sports Medicine building, we also have xray available there.   Follow up with me in about 3 months for labs and medication management, sooner if needed.

## 2024-01-02 NOTE — Progress Notes (Unsigned)
 New Patient Visit  Subjective:     Patient ID: Miranda Hill, female    DOB: 1965-05-03, 58 y.o.   MRN: [id number]  No chief complaint on file.   HPI  Discussed the use of AI scribe software for clinical note transcription with the patient, who gave verbal consent to proceed.  History of Present Illness Miranda Hill is a 58 year old female who presents for establishment of care and management of her ongoing medical issues.  Malignancy and chemotherapy-related symptoms - Undergoing treatment for cancer with planned complete hysterectomy and omental resection scheduled for December 17th. - Chemotherapy has resulted in neuropathy affecting toes and neck. - Persistent cough and fatigue limiting daily activities. - Abdominal tenderness and pain, making it difficult to wear a bra. - CA-125 level is 36.7. - Concerned about risk for colon cancer due to family history.  Pelvic organ prolapse and urinary symptoms - History of pelvic organ prolapse with biological sling placed 26 years ago. - Experiencing voiding difficulties related to prior sling placement. - Scheduled for bladder tacking with mesh.  Hiatal hernia and gastrointestinal symptoms - Hiatal hernia causing abdominal pain and puffiness. - Partial relief of symptoms with omeprazole .  Hypertension and psychosocial stressors - Elevated blood pressure attributed to stress from family dynamics, particularly involving stepson and his wife.  Physical activity and sleep - Maintains activity by walking two miles and using Bowflex stair stepper. - Performs physical therapy exercises. - Sleeps six hours nightly with CPAP machine.     ROS Per HPI  Outpatient Encounter Medications as of 01/02/2024  Medication Sig   [START ON 01/13/2024] bisacodyl  (DULCOLAX) 5 MG EC tablet The day before surgery starting at 9:00 am, take 4 Dulcolax tablets with 2 glasses of clear liquid.   dexamethasone  (DECADRON ) 4 MG tablet Take 2 tabs  by mouth at the night before and 2 tab the morning of chemotherapy, every 3 weeks, by mouth x 6 cycles   [START ON 01/13/2024] erythromycin  base (E-MYCIN ) 500 MG tablet The day before surgery, plan to take 2 tablets (1000 mg total) at 2pm, 3 pm, and 10 pm (Patient not taking: Reported on 12/31/2023)   lidocaine -prilocaine  (EMLA ) cream Apply to affected area once (Patient not taking: Reported on 12/31/2023)   metFORMIN  (GLUCOPHAGE ) 500 MG tablet Take 1 tablet (500 mg total) by mouth 2 (two) times daily with a meal.   Multiple Vitamin (MULTIVITAMIN WITH MINERALS) TABS tablet Take 1 tablet by mouth once a week. Life Extension Two Per Day Multivitamin   [START ON 01/13/2024] neomycin  (MYCIFRADIN ) 500 MG tablet The day before surgery, plan to take 2 tablets (1000 mg total) at 2pm, 3 pm, and 10 pm   omeprazole  (PRILOSEC) 40 MG capsule Take 40 mg by mouth daily before breakfast.   ondansetron  (ZOFRAN ) 4 MG tablet Take 1 tablet (4 mg total) by mouth every 8 (eight) hours as needed for nausea or vomiting.   ondansetron  (ZOFRAN ) 8 MG tablet Take 1 tablet (8 mg total) by mouth every 8 (eight) hours as needed for nausea or vomiting.   oxyCODONE  (OXY IR/ROXICODONE ) 5 MG immediate release tablet Take 1 tablet (5 mg total) by mouth every 4 (four) hours as needed for severe pain (pain score 7-10).   polyethylene glycol (MIRALAX  / GLYCOLAX ) 17 g packet Take 17 g by mouth daily.   polyethylene glycol powder (GLYCOLAX /MIRALAX ) 17 GM/SCOOP powder Take 17 g by mouth daily. Drink 17g (1 scoop) dissolved in water  per day.   [  START ON 01/13/2024] polyethylene glycol powder (MIRALAX ) 17 GM/SCOOP powder The day before surgery at 11:00 am, mix whole bottle of Miralax  in 64 oz of Gatorade and drink one 8 oz glass every 15 minutes until gone.   prochlorperazine  (COMPAZINE ) 10 MG tablet Take 1 tablet (10 mg total) by mouth every 6 (six) hours as needed for nausea or vomiting. (Patient not taking: Reported on 12/31/2023)   senna  (SENOKOT) 8.6 MG TABS tablet Take 1 tablet by mouth daily.   Trospium  Chloride 60 MG CP24 Take 1 capsule (60 mg total) by mouth daily.   No facility-administered encounter medications on file as of 01/02/2024.    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   History of chlamydia    1990   Multiple thyroid  nodules    Prediabetes    Sleep apnea    does not use CPAP due to voiding hourly every night    Past Surgical History:  Procedure Laterality Date   INTRAUTERINE DEVICE INSERTION  07/18/2008   MIRENA   IR IMAGING GUIDED PORT INSERTION  10/17/2023   IR PARACENTESIS  10/01/2023   IR PARACENTESIS  10/06/2023   IR PARACENTESIS  10/17/2023   IR PARACENTESIS  10/23/2023   SALIVARY GLAND SURGERY     ABSCESS   URETHRAL SLING  1999   WREN    Family History  Problem Relation Age of Onset   Colon cancer Mother 85   Hypertension Mother    Diabetes Mother    Hypertension Father    Diabetes Father    Lung cancer Father 88 - 46       brain mets   Other Sister        hypoglycemic   Colon polyps Sister    Depression Brother    Skin cancer Maternal Uncle    Breast cancer Paternal Aunt        dx >50   Uterine cancer Maternal Grandmother        dx>50 y.o.   Autism spectrum disorder Son     Social History   Socioeconomic History   Marital status: Married    Spouse name: Not on file   Number of children: Not on file   Years of education: Not on file   Highest education level: Associate degree: academic program  Occupational History   Not on file  Tobacco Use   Smoking status: Never   Smokeless tobacco: Never  Vaping Use   Vaping status: Never Used  Substance and Sexual Activity   Alcohol use: Not Currently   Drug use: No   Sexual activity: Yes    Birth control/protection: Post-menopausal    Comment: husband with vasectomy  Other Topics Concern   Not on file  Social History Narrative   Not on file   Social Drivers of Health    Financial Resource Strain: Medium Risk (12/29/2023)   Overall Financial Resource Strain (CARDIA)    Difficulty of Paying Living Expenses: Somewhat hard  Food Insecurity: No Food Insecurity (12/29/2023)   Hunger Vital Sign    Worried About Running Out of Food in the Last Year: Never true    Ran Out of Food in the Last Year: Never true  Transportation Needs: No Transportation Needs (12/29/2023)   PRAPARE - Administrator, Civil Service (Medical): No    Lack of Transportation (Non-Medical): No  Physical Activity: Insufficiently Active (12/29/2023)   Exercise Vital Sign    Days of Exercise  per Week: 2 days    Minutes of Exercise per Session: 30 min  Stress: No Stress Concern Present (12/29/2023)   [employer] of Occupational Health - Occupational Stress Questionnaire    Feeling of Stress: Only a little  Social Connections: Moderately Integrated (12/29/2023)   Social Connection and Isolation Panel    Frequency of Communication with Friends and Family: Three times a week    Frequency of Social Gatherings with Friends and Family: Once a week    Attends Religious Services: More than 4 times per year    Active Member of [employer] or Organizations: No    Attends Banker Meetings: Not on file    Marital Status: Married  Catering Manager Violence: Not At Risk (10/14/2023)   Humiliation, Afraid, Rape, and Kick questionnaire    Fear of Current or Ex-Partner: No    Emotionally Abused: No    Physically Abused: No    Sexually Abused: No       Objective:    There were no vitals taken for this visit.   Physical Exam Vitals and nursing note reviewed.  Constitutional:      General: She is not in acute distress.    Appearance: Normal appearance. She is normal weight.  HENT:     Head: Normocephalic and atraumatic.     Right Ear: External ear normal.     Left Ear: External ear normal.     Nose: Nose normal.     Mouth/Throat:     Mouth: Mucous membranes are moist.      Pharynx: Oropharynx is clear.  Eyes:     Extraocular Movements: Extraocular movements intact.     Pupils: Pupils are equal, round, and reactive to light.  Cardiovascular:     Rate and Rhythm: Normal rate and regular rhythm.     Pulses: Normal pulses.     Heart sounds: Normal heart sounds.  Pulmonary:     Effort: Pulmonary effort is normal. No respiratory distress.     Breath sounds: Normal breath sounds. No wheezing, rhonchi or rales.  Musculoskeletal:        General: Normal range of motion.     Cervical back: Normal range of motion.     Right lower leg: No edema.     Left lower leg: No edema.  Lymphadenopathy:     Cervical: No cervical adenopathy.  Neurological:     General: No focal deficit present.     Mental Status: She is alert and oriented to person, place, and time.  Psychiatric:        Mood and Affect: Mood normal.        Thought Content: Thought content normal.     No results found for any visits on 01/02/24.      Assessment & Plan:   Assessment and Plan Assessment & Plan Ovarian cancer with peritoneal involvement Ongoing chemotherapy management. CA-125 increased slightly. Persistent abdominal pain and tenderness, possibly due to fluid or cancer progression. Surgery scheduled. - Proceed with hysterectomy and omentectomy on December 17th. - Monitor CA-125 levels.  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 Previous sling too tight, causing voiding issues. Surgery scheduled. - Proceed with surgical intervention for prolapse and urinary retention.  Chemotherapy-induced peripheral neuropathy Peripheral neuropathy in toes likely due to chemotherapy. Symptoms include numbness and tingling. - Continue current management and monitor symptoms.  Malignant ascites Previous paracentesis removed 8 pounds of fluid. Current abdominal puffiness and tenderness, possibly due to fluid  or cancer progression. - Continue omeprazole  for  symptom management.  Gastroesophageal reflux disease GERD managed with omeprazole . Symptoms possibly exacerbated by chemotherapy and fluid. - Continue omeprazole  for GERD management.  Obstructive sleep apnea Managed with CPAP. - Continue CPAP therapy.     No orders of the defined types were placed in this encounter.    No orders of the defined types were placed in this encounter.   No follow-ups on file.  Corean LITTIE Ku, FNP

## 2024-01-02 NOTE — Patient Instructions (Addendum)
 SURGICAL WAITING ROOM VISITATION  Patients having surgery or a procedure may have no more than 2 support people in the waiting area - these visitors may rotate.    Children under the age of 61 must have an adult with them who is not the patient.  Visitors with respiratory illnesses are discouraged from visiting and should remain at home.  If the patient needs to stay at the hospital during part of their recovery, the visitor guidelines for inpatient rooms apply. Pre-op nurse will coordinate an appropriate time for 1 support person to accompany patient in pre-op.  This support person may not rotate.    Please refer to the [facility] website for the visitor guidelines for Inpatients (after your surgery is over and you are in a regular room).       Your procedure is scheduled on:    01/14/2024    Report to [facility] Main Entrance    Report to admitting at  0930 AM   Call this number if you have problems the morning of surgery [phone]   Clear liquid diet the day before surgery.              Follow Bowel Prep instructons per MD.     After Midnight you may have the following liquids until __  0830____ AM  DAY OF SURGERY  Water Non-Citrus Juices (without pulp, NO RED-Apple, White grape, White cranberry) Black Coffee (NO MILK/CREAM OR CREAMERS, sugar ok)  Clear Tea (NO MILK/CREAM OR CREAMERS, sugar ok) regular and decaf                             Plain Jell-O (NO RED)                                           Fruit ices (not with fruit pulp, NO RED)                                     Popsicles (NO RED)                                                               Sports drinks like Gatorade (NO RED)              Drink 2 Ensure/G2 drinks AT 10:00 PM the night before surgery.        The day of surgery:  Drink ONE (1) Pre-Surgery Clear Ensure or G2 at   0830AM the morning of surgery. Drink in one sitting. Do not sip.  This drink was given to you during your hospital   pre-op appointment visit. Nothing else to drink after completing the  Pre-Surgery Clear Ensure or G2.          If you have questions, please contact your surgeon's office.   FOLLOW BOWEL PREP AND ANY ADDITIONAL PRE OP INSTRUCTIONS YOU RECEIVED FROM YOUR SURGEON'S OFFICE!!!     Oral Hygiene is also important to reduce your risk of infection.  Remember - BRUSH YOUR TEETH THE MORNING OF SURGERY WITH YOUR REGULAR TOOTHPASTE  DENTURES WILL BE REMOVED PRIOR TO SURGERY PLEASE DO NOT APPLY Poly grip OR ADHESIVES!!!   Do NOT smoke after Midnight   Stop all vitamins and herbal supplements 7 days before surgery.   Take these medicines the morning of surgery with A SIP OF WATER:  omeprazole              Metformin - none am of surgery   DO NOT TAKE ANY ORAL DIABETIC MEDICATIONS DAY OF YOUR SURGERY  Bring CPAP mask and tubing day of surgery.                              You may not have any metal on your body including hair pins, jewelry, and body piercing             Do not wear make-up, lotions, powders, perfumes/cologne, or deodorant  Do not wear nail polish including gel and S&S, artificial/acrylic nails, or any other type of covering on natural nails including finger and toenails. If you have artificial nails, gel coating, etc. that needs to be removed by a nail salon please have this removed prior to surgery or surgery may need to be canceled/ delayed if the surgeon/ anesthesia feels like they are unable to be safely monitored.   Do not shave  48 hours prior to surgery.               Men may shave face and neck.   Do not bring valuables to the hospital. [facility] IS NOT             RESPONSIBLE   FOR VALUABLES.   Contacts, glasses, dentures or bridgework may not be worn into surgery.   Bring small overnight bag day of surgery.   DO NOT BRING YOUR HOME MEDICATIONS TO THE HOSPITAL. PHARMACY WILL DISPENSE MEDICATIONS LISTED ON YOUR MEDICATION  LIST TO YOU DURING YOUR ADMISSION IN THE HOSPITAL!    Patients discharged on the day of surgery will not be allowed to drive home.  Someone NEEDS to stay with you for the first 24 hours after anesthesia.   Special Instructions: Bring a copy of your healthcare power of attorney and living will documents the day of surgery if you haven't scanned them before.              Please read over the following fact sheets you were given: IF YOU HAVE QUESTIONS ABOUT YOUR PRE-OP INSTRUCTIONS PLEASE CALL [phone].   If you received a COVID test during your pre-op visit  it is requested that you wear a mask when out in public, stay away from anyone that may not be feeling well and notify your surgeon if you develop symptoms. If you test positive for Covid or have been in contact with anyone that has tested positive in the last 10 days please notify you surgeon.    [facility] - Preparing for Surgery Before surgery, you can play an important role.  Because skin is not sterile, your skin needs to be as free of germs as possible.  You can reduce the number of germs on your skin by washing with CHG (chlorahexidine gluconate) soap before surgery.  CHG is an antiseptic cleaner which kills germs and bonds with the skin to continue killing germs even after washing. Please DO NOT use if you have an allergy to CHG or antibacterial  soaps.  If your skin becomes reddened/irritated stop using the CHG and inform your nurse when you arrive at Short Stay. Do not shave (including legs and underarms) for at least 48 hours prior to the first CHG shower.  You may shave your face/neck.  Please follow these instructions carefully:  1.  Shower with CHG Soap the night before surgery ONLY (DO NOT USE THE SOAP THE MORNING OF SURGERY).  2.  If you choose to wash your hair, wash your hair first as usual with your normal  shampoo.  3.  After you shampoo, rinse your hair and body thoroughly to remove the shampoo.                              4.  Use CHG as you would any other liquid soap.  You can apply chg directly to the skin and wash.  Gently with a scrungie or clean washcloth.  5.  Apply the CHG Soap to your body ONLY FROM THE NECK DOWN.   Do   not use on face/ open                           Wound or open sores. Avoid contact with eyes, ears mouth and   genitals (private parts).                       Wash face,  Genitals (private parts) with your normal soap.             6.  Wash thoroughly, paying special attention to the area where your    surgery  will be performed.  7.  Thoroughly rinse your body with warm water from the neck down.  8.  DO NOT shower/wash with your normal soap after using and rinsing off the CHG Soap.                9.  Pat yourself dry with a clean towel.            10.  Wear clean pajamas.            11.  Place clean sheets on your bed the night of your first shower and do not  sleep with pets. Day of Surgery : Do not apply any CHG, lotions/deodorants the morning of surgery.  Please wear clean clothes to the hospital/surgery center.  FAILURE TO FOLLOW THESE INSTRUCTIONS MAY RESULT IN THE CANCELLATION OF YOUR SURGERY  PATIENT SIGNATURE_________________________________  NURSE SIGNATURE__________________________________  ________________________________________________________________________

## 2024-01-05 ENCOUNTER — Encounter (HOSPITAL_COMMUNITY): Payer: Self-pay

## 2024-01-05 ENCOUNTER — Other Ambulatory Visit: Payer: Self-pay

## 2024-01-05 ENCOUNTER — Encounter: Payer: Self-pay | Admitting: Hematology and Oncology

## 2024-01-05 ENCOUNTER — Encounter (HOSPITAL_COMMUNITY)
Admission: RE | Admit: 2024-01-05 | Discharge: 2024-01-05 | Disposition: A | Source: Ambulatory Visit | Attending: Gynecologic Oncology

## 2024-01-05 VITALS — BP 135/81 | HR 99 | Temp 98.7°F | Resp 16 | Ht 62.0 in | Wt 171.0 lb

## 2024-01-05 DIAGNOSIS — Z01818 Encounter for other preprocedural examination: Secondary | ICD-10-CM

## 2024-01-05 DIAGNOSIS — C569 Malignant neoplasm of unspecified ovary: Secondary | ICD-10-CM

## 2024-01-05 DIAGNOSIS — Z01812 Encounter for preprocedural laboratory examination: Secondary | ICD-10-CM | POA: Diagnosis not present

## 2024-01-05 HISTORY — DX: Polyneuropathy, unspecified: G62.9

## 2024-01-05 HISTORY — DX: Personal history of other diseases of the digestive system: Z87.19

## 2024-01-05 HISTORY — DX: Prediabetes: R73.03

## 2024-01-05 HISTORY — DX: Nausea with vomiting, unspecified: R11.2

## 2024-01-05 HISTORY — DX: Gastro-esophageal reflux disease without esophagitis: K21.9

## 2024-01-05 LAB — CBC WITH DIFFERENTIAL/PLATELET
Abs Immature Granulocytes: 0.05 K/uL (ref 0.00–0.07)
Basophils Absolute: 0 K/uL (ref 0.0–0.1)
Basophils Relative: 0 %
Eosinophils Absolute: 0.1 K/uL (ref 0.0–0.5)
Eosinophils Relative: 1 %
HCT: 34.2 % — ABNORMAL LOW (ref 36.0–46.0)
Hemoglobin: 11.1 g/dL — ABNORMAL LOW (ref 12.0–15.0)
Immature Granulocytes: 1 %
Lymphocytes Relative: 32 %
Lymphs Abs: 1.7 K/uL (ref 0.7–4.0)
MCH: 29.5 pg (ref 26.0–34.0)
MCHC: 32.5 g/dL (ref 30.0–36.0)
MCV: 91 fL (ref 80.0–100.0)
Monocytes Absolute: 0.6 K/uL (ref 0.1–1.0)
Monocytes Relative: 12 %
Neutro Abs: 2.7 K/uL (ref 1.7–7.7)
Neutrophils Relative %: 54 %
Platelets: 138 K/uL — ABNORMAL LOW (ref 150–400)
RBC: 3.76 MIL/uL — ABNORMAL LOW (ref 3.87–5.11)
RDW: 18.5 % — ABNORMAL HIGH (ref 11.5–15.5)
WBC: 5.1 K/uL (ref 4.0–10.5)
nRBC: 0 % (ref 0.0–0.2)

## 2024-01-05 LAB — COMPREHENSIVE METABOLIC PANEL WITH GFR
ALT: 26 U/L (ref 0–44)
AST: 25 U/L (ref 15–41)
Albumin: 4.5 g/dL (ref 3.5–5.0)
Alkaline Phosphatase: 96 U/L (ref 38–126)
Anion gap: 10 (ref 5–15)
BUN: 18 mg/dL (ref 6–20)
CO2: 26 mmol/L (ref 22–32)
Calcium: 9.8 mg/dL (ref 8.9–10.3)
Chloride: 103 mmol/L (ref 98–111)
Creatinine, Ser: 0.6 mg/dL (ref 0.44–1.00)
GFR, Estimated: >60 mL/min (ref >60)
Glucose, Bld: 111 mg/dL — ABNORMAL HIGH (ref 70–99)
Potassium: 4.1 mmol/L (ref 3.5–5.1)
Sodium: 140 mmol/L (ref 135–145)
Total Bilirubin: 0.4 mg/dL (ref 0.0–1.2)
Total Protein: 7.3 g/dL (ref 6.5–8.1)

## 2024-01-06 ENCOUNTER — Encounter: Payer: Self-pay | Admitting: Hematology and Oncology

## 2024-01-12 ENCOUNTER — Telehealth: Payer: Self-pay

## 2024-01-12 NOTE — Telephone Encounter (Signed)
 Miranda Hill called the office stating she is having surgery on 12/17 with Dr.Tucker. She states in the past  she has N/V with Oxycodone  and wants to make sure something else will be sent in.

## 2024-01-13 ENCOUNTER — Telehealth: Payer: Self-pay | Admitting: *Deleted

## 2024-01-13 NOTE — Telephone Encounter (Signed)
 Telephone call to check on pre-operative status.  Patient compliant with pre-operative instructions.  Reinforced bowel prep instructions and Clear liquids until 0815. Patient to arrive at 0915.  No questions or concerns voiced.  Instructed to call for any needs.

## 2024-01-14 ENCOUNTER — Other Ambulatory Visit: Payer: Self-pay

## 2024-01-14 ENCOUNTER — Inpatient Hospital Stay (HOSPITAL_COMMUNITY): Payer: Self-pay

## 2024-01-14 ENCOUNTER — Other Ambulatory Visit: Payer: Self-pay | Admitting: Gynecologic Oncology

## 2024-01-14 ENCOUNTER — Inpatient Hospital Stay (HOSPITAL_COMMUNITY)
Admission: AD | Admit: 2024-01-14 | Discharge: 2024-01-22 | Disposition: A | Discharge status: Home / Self Care | Source: Ambulatory Visit | Attending: Gynecologic Oncology | Admitting: Gynecologic Oncology

## 2024-01-14 ENCOUNTER — Encounter (HOSPITAL_COMMUNITY): Payer: Self-pay | Admitting: Medical

## 2024-01-14 ENCOUNTER — Encounter (HOSPITAL_COMMUNITY): Payer: Self-pay | Admitting: Gynecologic Oncology

## 2024-01-14 ENCOUNTER — Encounter (HOSPITAL_COMMUNITY)
Admission: AD | Disposition: A | Discharge status: Home / Self Care | Payer: Self-pay | Source: Home / Self Care | Attending: Gynecologic Oncology

## 2024-01-14 DIAGNOSIS — N8003 Adenomyosis of the uterus: Secondary | ICD-10-CM | POA: Diagnosis present

## 2024-01-14 DIAGNOSIS — C579 Malignant neoplasm of female genital organ, unspecified: Secondary | ICD-10-CM | POA: Diagnosis present

## 2024-01-14 DIAGNOSIS — N3946 Mixed incontinence: Secondary | ICD-10-CM | POA: Diagnosis present

## 2024-01-14 DIAGNOSIS — N814 Uterovaginal prolapse, unspecified: Secondary | ICD-10-CM | POA: Diagnosis present

## 2024-01-14 DIAGNOSIS — N813 Complete uterovaginal prolapse: Secondary | ICD-10-CM | POA: Diagnosis not present

## 2024-01-14 DIAGNOSIS — G4733 Obstructive sleep apnea (adult) (pediatric): Secondary | ICD-10-CM | POA: Diagnosis present

## 2024-01-14 DIAGNOSIS — E876 Hypokalemia: Secondary | ICD-10-CM | POA: Diagnosis not present

## 2024-01-14 DIAGNOSIS — Z885 Allergy status to narcotic agent status: Secondary | ICD-10-CM

## 2024-01-14 DIAGNOSIS — Z800 Family history of malignant neoplasm of digestive organs: Secondary | ICD-10-CM

## 2024-01-14 DIAGNOSIS — C563 Malignant neoplasm of bilateral ovaries: Secondary | ICD-10-CM | POA: Diagnosis not present

## 2024-01-14 DIAGNOSIS — R000 Tachycardia, unspecified: Secondary | ICD-10-CM | POA: Diagnosis not present

## 2024-01-14 DIAGNOSIS — Z8739 Personal history of other diseases of the musculoskeletal system and connective tissue: Secondary | ICD-10-CM

## 2024-01-14 DIAGNOSIS — G8918 Other acute postprocedural pain: Principal | ICD-10-CM | POA: Diagnosis present

## 2024-01-14 DIAGNOSIS — Z818 Family history of other mental and behavioral disorders: Secondary | ICD-10-CM

## 2024-01-14 DIAGNOSIS — R351 Nocturia: Secondary | ICD-10-CM | POA: Diagnosis present

## 2024-01-14 DIAGNOSIS — C569 Malignant neoplasm of unspecified ovary: Secondary | ICD-10-CM | POA: Diagnosis not present

## 2024-01-14 DIAGNOSIS — C5700 Malignant neoplasm of unspecified fallopian tube: Principal | ICD-10-CM | POA: Diagnosis present

## 2024-01-14 DIAGNOSIS — F418 Other specified anxiety disorders: Secondary | ICD-10-CM | POA: Diagnosis present

## 2024-01-14 DIAGNOSIS — K219 Gastro-esophageal reflux disease without esophagitis: Secondary | ICD-10-CM | POA: Diagnosis present

## 2024-01-14 DIAGNOSIS — Z8049 Family history of malignant neoplasm of other genital organs: Secondary | ICD-10-CM

## 2024-01-14 DIAGNOSIS — Z8249 Family history of ischemic heart disease and other diseases of the circulatory system: Secondary | ICD-10-CM

## 2024-01-14 DIAGNOSIS — R140 Abdominal distension (gaseous): Secondary | ICD-10-CM | POA: Diagnosis not present

## 2024-01-14 DIAGNOSIS — Z7984 Long term (current) use of oral hypoglycemic drugs: Secondary | ICD-10-CM

## 2024-01-14 DIAGNOSIS — Z9889 Other specified postprocedural states: Secondary | ICD-10-CM

## 2024-01-14 DIAGNOSIS — F32A Depression, unspecified: Secondary | ICD-10-CM | POA: Diagnosis present

## 2024-01-14 DIAGNOSIS — Z79899 Other long term (current) drug therapy: Secondary | ICD-10-CM

## 2024-01-14 DIAGNOSIS — Z808 Family history of malignant neoplasm of other organs or systems: Secondary | ICD-10-CM

## 2024-01-14 DIAGNOSIS — C785 Secondary malignant neoplasm of large intestine and rectum: Secondary | ICD-10-CM | POA: Diagnosis present

## 2024-01-14 DIAGNOSIS — Z803 Family history of malignant neoplasm of breast: Secondary | ICD-10-CM

## 2024-01-14 DIAGNOSIS — Z833 Family history of diabetes mellitus: Secondary | ICD-10-CM | POA: Diagnosis not present

## 2024-01-14 DIAGNOSIS — K6389 Other specified diseases of intestine: Secondary | ICD-10-CM | POA: Diagnosis not present

## 2024-01-14 DIAGNOSIS — D62 Acute posthemorrhagic anemia: Secondary | ICD-10-CM | POA: Diagnosis not present

## 2024-01-14 DIAGNOSIS — C786 Secondary malignant neoplasm of retroperitoneum and peritoneum: Secondary | ICD-10-CM | POA: Diagnosis present

## 2024-01-14 DIAGNOSIS — Z170 Estrogen receptor positive status [ER+]: Secondary | ICD-10-CM

## 2024-01-14 DIAGNOSIS — Z801 Family history of malignant neoplasm of trachea, bronchus and lung: Secondary | ICD-10-CM

## 2024-01-14 DIAGNOSIS — R350 Frequency of micturition: Secondary | ICD-10-CM | POA: Diagnosis not present

## 2024-01-14 DIAGNOSIS — G629 Polyneuropathy, unspecified: Secondary | ICD-10-CM | POA: Diagnosis present

## 2024-01-14 DIAGNOSIS — R7303 Prediabetes: Secondary | ICD-10-CM | POA: Diagnosis present

## 2024-01-14 DIAGNOSIS — K567 Ileus, unspecified: Secondary | ICD-10-CM | POA: Diagnosis not present

## 2024-01-14 DIAGNOSIS — N8110 Cystocele, unspecified: Secondary | ICD-10-CM

## 2024-01-14 DIAGNOSIS — Z1721 Progesterone receptor positive status: Secondary | ICD-10-CM

## 2024-01-14 DIAGNOSIS — D61818 Other pancytopenia: Secondary | ICD-10-CM | POA: Diagnosis not present

## 2024-01-14 DIAGNOSIS — J9811 Atelectasis: Secondary | ICD-10-CM | POA: Diagnosis not present

## 2024-01-14 DIAGNOSIS — Z91040 Latex allergy status: Secondary | ICD-10-CM

## 2024-01-14 DIAGNOSIS — Z888 Allergy status to other drugs, medicaments and biological substances status: Secondary | ICD-10-CM

## 2024-01-14 DIAGNOSIS — Z01818 Encounter for other preprocedural examination: Secondary | ICD-10-CM

## 2024-01-14 DIAGNOSIS — R110 Nausea: Secondary | ICD-10-CM

## 2024-01-14 HISTORY — PX: CYSTOSCOPY: SHX5120

## 2024-01-14 HISTORY — PX: LAPAROSCOPY: SHX197

## 2024-01-14 HISTORY — PX: PUBOVAGINAL SLING: SHX1035

## 2024-01-14 HISTORY — PX: VAGINAL PROLAPSE REPAIR: SHX830

## 2024-01-14 HISTORY — PX: HYSTERECTOMY,ABD,W/BILAT SALPINGO-OOPHORECT,W/NEOPLASM DEBUL: SHX7723

## 2024-01-14 HISTORY — PX: PERINEOPLASTY: SHX2218

## 2024-01-14 HISTORY — PX: ANTERIOR AND POSTERIOR REPAIR: SHX5121

## 2024-01-14 LAB — TYPE AND SCREEN
ABO/RH(D): A POS
Antibody Screen: NEGATIVE

## 2024-01-14 LAB — ABO/RH: ABO/RH(D): A POS

## 2024-01-14 LAB — GLUCOSE, CAPILLARY
Glucose-Capillary: 123 mg/dL — ABNORMAL HIGH (ref 70–99)
Glucose-Capillary: 161 mg/dL — ABNORMAL HIGH (ref 70–99)

## 2024-01-14 SURGERY — LAPAROSCOPY, DIAGNOSTIC
Anesthesia: General

## 2024-01-14 MED ORDER — SUGAMMADEX SODIUM 200 MG/2ML IV SOLN
INTRAVENOUS | Status: AC
  Filled 2024-01-14: qty 2

## 2024-01-14 MED ORDER — SODIUM CHLORIDE (PF) 0.9 % IJ SOLN
INTRAMUSCULAR | Status: AC
  Filled 2024-01-14: qty 50

## 2024-01-14 MED ORDER — ONDANSETRON HCL 4 MG/2ML IJ SOLN
INTRAMUSCULAR | Status: DC | PRN
  Administered 2024-01-14: 17:00:00 4 mg via INTRAVENOUS

## 2024-01-14 MED ORDER — HYDROMORPHONE HCL 1 MG/ML IJ SOLN
0.5000 mg | INTRAMUSCULAR | Status: DC | PRN
  Administered 2024-01-15 – 2024-01-20 (×10): 1 mg via INTRAVENOUS
  Filled 2024-01-14 (×10): qty 1

## 2024-01-14 MED ORDER — DEXMEDETOMIDINE HCL IN NACL 80 MCG/20ML IV SOLN
INTRAVENOUS | Status: AC
  Filled 2024-01-14: qty 20

## 2024-01-14 MED ORDER — ONDANSETRON HCL 4 MG/2ML IJ SOLN: 4.0000 mg | Freq: Once | INTRAMUSCULAR | Status: DC | PRN

## 2024-01-14 MED ORDER — PROPOFOL 10 MG/ML IV BOLUS
INTRAVENOUS | Status: AC
  Filled 2024-01-14: qty 20

## 2024-01-14 MED ORDER — ALBUMIN HUMAN 5 % IV SOLN
INTRAVENOUS | Status: AC
  Filled 2024-01-14: qty 250

## 2024-01-14 MED ORDER — MIDAZOLAM HCL (PF) 2 MG/2ML IJ SOLN
1.0000 mg | INTRAMUSCULAR | Status: DC
  Administered 2024-01-14: 10:00:00 2 mg via INTRAVENOUS
  Filled 2024-01-14: qty 2

## 2024-01-14 MED ORDER — BUPIVACAINE HCL (PF) 0.5 % IJ SOLN
INTRAMUSCULAR | Status: DC | PRN
  Administered 2024-01-14: 10:00:00 10 mL

## 2024-01-14 MED ORDER — ACETAMINOPHEN 10 MG/ML IV SOLN: 1000.0000 mg | Freq: Once | INTRAVENOUS | Status: DC | PRN

## 2024-01-14 MED ORDER — 0.9 % SODIUM CHLORIDE (POUR BTL) OPTIME
TOPICAL | Status: DC | PRN
  Administered 2024-01-14: 13:00:00 1000 mL

## 2024-01-14 MED ORDER — PHENAZOPYRIDINE HCL 200 MG PO TABS
200.0000 mg | ORAL_TABLET | ORAL | Status: AC
  Administered 2024-01-14: 09:00:00 200 mg via ORAL
  Filled 2024-01-14: qty 1

## 2024-01-14 MED ORDER — CHLORHEXIDINE GLUCONATE 0.12 % MT SOLN
15.0000 mL | Freq: Once | OROMUCOSAL | Status: AC
  Administered 2024-01-14: 09:00:00 15 mL via OROMUCOSAL

## 2024-01-14 MED ORDER — BUPIVACAINE HCL 0.25 % IJ SOLN
INTRAMUSCULAR | Status: DC | PRN
  Administered 2024-01-14: 13:00:00 30 mL

## 2024-01-14 MED ORDER — STERILE WATER FOR IRRIGATION IR SOLN
Status: DC | PRN
  Administered 2024-01-14: 13:00:00 1000 mL

## 2024-01-14 MED ORDER — ACETAMINOPHEN 10 MG/ML IV SOLN
1000.0000 mg | Freq: Four times a day (QID) | INTRAVENOUS | Status: AC
  Administered 2024-01-14 – 2024-01-15 (×4): 1000 mg via INTRAVENOUS
  Filled 2024-01-14 (×4): qty 100

## 2024-01-14 MED ORDER — PROPOFOL 1000 MG/100ML IV EMUL
INTRAVENOUS | Status: AC
  Filled 2024-01-14: qty 100

## 2024-01-14 MED ORDER — LACTATED RINGERS IR SOLN
Status: DC | PRN
  Administered 2024-01-14: 13:00:00 1000 mL

## 2024-01-14 MED ORDER — LIDOCAINE HCL (PF) 2 % IJ SOLN
INTRAMUSCULAR | Status: AC
  Filled 2024-01-14: qty 5

## 2024-01-14 MED ORDER — ACETAMINOPHEN 500 MG PO TABS
1000.0000 mg | ORAL_TABLET | ORAL | Status: AC
  Administered 2024-01-14: 10:00:00 1000 mg via ORAL
  Filled 2024-01-14: qty 2

## 2024-01-14 MED ORDER — PROPOFOL 10 MG/ML IV BOLUS
INTRAVENOUS | Status: DC | PRN
  Administered 2024-01-14: 12:00:00 100 mg via INTRAVENOUS
  Administered 2024-01-14: 12:00:00 20 mg via INTRAVENOUS

## 2024-01-14 MED ORDER — ROCURONIUM BROMIDE 10 MG/ML (PF) SYRINGE: PREFILLED_SYRINGE | INTRAVENOUS | Status: DC | PRN

## 2024-01-14 MED ORDER — ROCURONIUM BROMIDE 10 MG/ML (PF) SYRINGE
PREFILLED_SYRINGE | INTRAVENOUS | Status: AC
  Filled 2024-01-14: qty 10

## 2024-01-14 MED ORDER — KETOROLAC TROMETHAMINE 30 MG/ML IJ SOLN
INTRAMUSCULAR | Status: AC
  Filled 2024-01-14: qty 1

## 2024-01-14 MED ORDER — ENOXAPARIN SODIUM 40 MG/0.4ML IJ SOSY: 40.0000 mg | PREFILLED_SYRINGE | INTRAMUSCULAR | Status: DC

## 2024-01-14 MED ORDER — DEXMEDETOMIDINE HCL IN NACL 80 MCG/20ML IV SOLN
INTRAVENOUS | Status: DC | PRN
  Administered 2024-01-14: 16:00:00 12 ug via INTRAVENOUS

## 2024-01-14 MED ORDER — SODIUM CHLORIDE 0.9 % IV SOLN
2.0000 g | INTRAVENOUS | Status: DC
  Filled 2024-01-14: qty 2

## 2024-01-14 MED ORDER — KETAMINE HCL 50 MG/5ML IJ SOSY
PREFILLED_SYRINGE | INTRAMUSCULAR | Status: AC
  Filled 2024-01-14: qty 5

## 2024-01-14 MED ORDER — LIDOCAINE-EPINEPHRINE 1 %-1:100000 IJ SOLN
INTRAMUSCULAR | Status: AC
  Filled 2024-01-14: qty 2

## 2024-01-14 MED ORDER — SUGAMMADEX SODIUM 200 MG/2ML IV SOLN
INTRAVENOUS | Status: DC | PRN
  Administered 2024-01-14: 18:00:00 200 mg via INTRAVENOUS

## 2024-01-14 MED ORDER — SODIUM CHLORIDE 0.9 % IV SOLN
2.0000 g | INTRAVENOUS | Status: AC
  Administered 2024-01-14 (×2): 2 g via INTRAVENOUS
  Filled 2024-01-14: qty 2

## 2024-01-14 MED ORDER — LACTATED RINGERS IV SOLN: INTRAVENOUS | Status: DC

## 2024-01-14 MED ORDER — ARTIFICIAL TEARS OPHTHALMIC OINT
TOPICAL_OINTMENT | OPHTHALMIC | Status: AC
  Filled 2024-01-14: qty 3.5

## 2024-01-14 MED ORDER — ONDANSETRON HCL 4 MG/2ML IJ SOLN
4.0000 mg | Freq: Four times a day (QID) | INTRAMUSCULAR | Status: DC | PRN
  Administered 2024-01-15 – 2024-01-18 (×7): 4 mg via INTRAVENOUS
  Filled 2024-01-14 (×7): qty 2

## 2024-01-14 MED ORDER — PROPOFOL 500 MG/50ML IV EMUL
INTRAVENOUS | Status: DC | PRN
  Administered 2024-01-14: 12:00:00 120 ug/kg/min via INTRAVENOUS

## 2024-01-14 MED ORDER — ONDANSETRON HCL 4 MG PO TABS: 4.0000 mg | ORAL_TABLET | Freq: Four times a day (QID) | ORAL | Status: DC | PRN

## 2024-01-14 MED ORDER — POVIDONE-IODINE 10 % EX SWAB: 2.0000 | Freq: Once | CUTANEOUS | Status: DC

## 2024-01-14 MED ORDER — ALBUMIN HUMAN 5 % IV SOLN: INTRAVENOUS | Status: DC | PRN

## 2024-01-14 MED ORDER — KETAMINE HCL 50 MG/5ML IJ SOSY
PREFILLED_SYRINGE | INTRAMUSCULAR | Status: DC | PRN
  Administered 2024-01-14 (×2): 10 mg via INTRAVENOUS
  Administered 2024-01-14: 12:00:00 20 mg via INTRAVENOUS

## 2024-01-14 MED ORDER — ONDANSETRON HCL 4 MG/2ML IJ SOLN
INTRAMUSCULAR | Status: AC
  Filled 2024-01-14: qty 2

## 2024-01-14 MED ORDER — POVIDONE-IODINE 10 % EX SWAB
2.0000 | Freq: Once | CUTANEOUS | Status: AC
  Administered 2024-01-14: 09:00:00 2 via TOPICAL

## 2024-01-14 MED ORDER — BUPIVACAINE LIPOSOME 1.3 % IJ SUSP
INTRAMUSCULAR | Status: DC | PRN
  Administered 2024-01-14: 10:00:00 10 mL

## 2024-01-14 MED ORDER — MIDAZOLAM HCL 5 MG/5ML IJ SOLN
INTRAMUSCULAR | Status: DC | PRN
  Administered 2024-01-14: 12:00:00 2 mg via INTRAVENOUS

## 2024-01-14 MED ORDER — ROCURONIUM BROMIDE 100 MG/10ML IV SOLN
INTRAVENOUS | Status: DC | PRN
  Administered 2024-01-14: 12:00:00 80 mg via INTRAVENOUS
  Administered 2024-01-14: 14:00:00 20 mg via INTRAVENOUS
  Administered 2024-01-14: 14:00:00 30 mg via INTRAVENOUS
  Administered 2024-01-14: 16:00:00 20 mg via INTRAVENOUS
  Administered 2024-01-14: 13:00:00 30 mg via INTRAVENOUS
  Administered 2024-01-14: 15:00:00 20 mg via INTRAVENOUS

## 2024-01-14 MED ORDER — PANTOPRAZOLE SODIUM 40 MG IV SOLR
40.0000 mg | INTRAVENOUS | Status: DC
  Administered 2024-01-14 – 2024-01-15 (×2): 40 mg via INTRAVENOUS
  Filled 2024-01-14 (×2): qty 10

## 2024-01-14 MED ORDER — SCOPOLAMINE 1 MG/3DAYS TD PT72
1.0000 | MEDICATED_PATCH | TRANSDERMAL | Status: DC
  Administered 2024-01-14: 09:00:00 1 mg via TRANSDERMAL
  Filled 2024-01-14: qty 1

## 2024-01-14 MED ORDER — KCL IN DEXTROSE-NACL 20-5-0.45 MEQ/L-%-% IV SOLN
INTRAVENOUS | Status: AC
  Filled 2024-01-14 (×3): qty 1000

## 2024-01-14 MED ORDER — ENSURE PRE-SURGERY PO LIQD: 592.0000 mL | Freq: Once | ORAL | Status: DC

## 2024-01-14 MED ORDER — HEPARIN SODIUM (PORCINE) 5000 UNIT/ML IJ SOLN
5000.0000 [IU] | INTRAMUSCULAR | Status: AC
  Administered 2024-01-14: 09:00:00 5000 [IU] via SUBCUTANEOUS
  Filled 2024-01-14: qty 1

## 2024-01-14 MED ORDER — PHENYLEPHRINE HCL-NACL 20-0.9 MG/250ML-% IV SOLN
INTRAVENOUS | Status: DC | PRN
  Administered 2024-01-14 (×2): 80 ug via INTRAVENOUS
  Administered 2024-01-14: 12:00:00 15 ug/min via INTRAVENOUS
  Administered 2024-01-14: 17:00:00 80 ug via INTRAVENOUS

## 2024-01-14 MED ORDER — GABAPENTIN 300 MG PO CAPS
300.0000 mg | ORAL_CAPSULE | ORAL | Status: AC
  Administered 2024-01-14: 09:00:00 300 mg via ORAL
  Filled 2024-01-14: qty 1

## 2024-01-14 MED ORDER — DEXAMETHASONE SOD PHOSPHATE PF 10 MG/ML IJ SOLN
4.0000 mg | INTRAMUSCULAR | Status: AC
  Administered 2024-01-14: 12:00:00 5 mg via INTRAVENOUS

## 2024-01-14 MED ORDER — ORAL CARE MOUTH RINSE: 15.0000 mL | Freq: Once | OROMUCOSAL | Status: AC

## 2024-01-14 MED ORDER — FENTANYL CITRATE (PF) 50 MCG/ML IJ SOSY: 25.0000 ug | PREFILLED_SYRINGE | INTRAMUSCULAR | Status: DC | PRN

## 2024-01-14 MED ORDER — BUPIVACAINE HCL (PF) 0.25 % IJ SOLN
INTRAMUSCULAR | Status: AC
  Filled 2024-01-14: qty 30

## 2024-01-14 MED ORDER — NEOSTIGMINE METHYLSULFATE 3 MG/3ML IV SOSY
PREFILLED_SYRINGE | INTRAVENOUS | Status: AC
  Filled 2024-01-14: qty 6

## 2024-01-14 MED ORDER — INSULIN ASPART 100 UNIT/ML IJ SOLN
0.0000 [IU] | INTRAMUSCULAR | Status: DC | PRN
  Administered 2024-01-14: 16:00:00 2 [IU] via SUBCUTANEOUS

## 2024-01-14 MED ORDER — FENTANYL CITRATE (PF) 100 MCG/2ML IJ SOLN
INTRAMUSCULAR | Status: DC | PRN
  Administered 2024-01-14 (×2): 50 ug via INTRAVENOUS
  Administered 2024-01-14: 12:00:00 100 ug via INTRAVENOUS
  Administered 2024-01-14 (×2): 50 ug via INTRAVENOUS

## 2024-01-14 MED ORDER — FENTANYL CITRATE (PF) 50 MCG/ML IJ SOSY
50.0000 ug | PREFILLED_SYRINGE | INTRAMUSCULAR | Status: DC
  Filled 2024-01-14: qty 2

## 2024-01-14 MED ORDER — ENSURE PRE-SURGERY PO LIQD: 296.0000 mL | Freq: Once | ORAL | Status: DC

## 2024-01-14 MED FILL — Midazolam HCl Inj 2 MG/2ML (Base Equivalent): INTRAMUSCULAR | Qty: 2 | Status: AC

## 2024-01-14 MED FILL — Fentanyl Citrate Preservative Free (PF) Inj 100 MCG/2ML: INTRAMUSCULAR | Qty: 2 | Status: AC

## 2024-01-14 MED FILL — Fentanyl Citrate Preservative Free (PF) Inj 250 MCG/5ML: INTRAMUSCULAR | Qty: 5 | Status: AC

## 2024-01-14 SURGICAL SUPPLY — 133 items
APPLICATOR APC MALEABLE L100 (MISCELLANEOUS) IMPLANT
APPLICATOR SURGIFLO ENDO (HEMOSTASIS) IMPLANT
BAG COUNTER SPONGE SURGICOUNT (BAG) IMPLANT
BAG LAPAROSCOPIC 12 15 PORT 16 (BASKET) IMPLANT
BLADE CLIPPER SENSICLIP SURGIC (BLADE) ×1 IMPLANT
BLADE EXTENDED COATED 6.5IN (ELECTRODE) IMPLANT
BLADE SURG 15 STRL LF DISP TIS (BLADE) ×2 IMPLANT
BLADE SURG SZ10 CARB STEEL (BLADE) IMPLANT
CNTNR URN SCR LID CUP LEK RST (MISCELLANEOUS) IMPLANT
COUNTER NDL 20CT MAGNET RED (NEEDLE) IMPLANT
COVER BACK TABLE 60X90IN (DRAPES) ×1 IMPLANT
COVER TIP SHEARS 8 DVNC (MISCELLANEOUS) ×1 IMPLANT
DERMABOND ADVANCED .7 DNX12 (GAUZE/BANDAGES/DRESSINGS) ×1 IMPLANT
DEVICE CAPIO SLIM SINGLE (INSTRUMENTS) IMPLANT
DRAIN CHANNEL 19F RND (DRAIN) IMPLANT
DRAPE ARM DVNC X/XI (DISPOSABLE) ×4 IMPLANT
DRAPE COLUMN DVNC XI (DISPOSABLE) ×1 IMPLANT
DRAPE SHEET LG 3/4 BI-LAMINATE (DRAPES) ×1 IMPLANT
DRAPE SURG IRRIG POUCH 19X23 (DRAPES) ×1 IMPLANT
DRIVER NDL MEGA SUTCUT DVNCXI (INSTRUMENTS) ×1 IMPLANT
DRIVER NDLE MEGA SUTCUT DVNCXI (INSTRUMENTS) ×1 IMPLANT
DRSG OPSITE POSTOP 4X12 (GAUZE/BANDAGES/DRESSINGS) IMPLANT
DRSG OPSITE POSTOP 4X6 (GAUZE/BANDAGES/DRESSINGS) IMPLANT
DRSG OPSITE POSTOP 4X8 (GAUZE/BANDAGES/DRESSINGS) IMPLANT
DRSG TEGADERM 4X4.75 (GAUZE/BANDAGES/DRESSINGS) IMPLANT
ELECT PENCIL ROCKER SW 15FT (MISCELLANEOUS) IMPLANT
ELECT REM PT RETURN 15FT ADLT (MISCELLANEOUS) ×1 IMPLANT
FORCEPS BPLR FENES DVNC XI (FORCEP) ×1 IMPLANT
FORCEPS PROGRASP DVNC XI (FORCEP) ×1 IMPLANT
GAUZE 4X4 16PLY ~~LOC~~+RFID DBL (SPONGE) ×2 IMPLANT
GAUZE SPONGE 4X4 12PLY STRL (GAUZE/BANDAGES/DRESSINGS) IMPLANT
GLOVE BIO SURGEON STRL SZ 6 (GLOVE) ×4 IMPLANT
GLOVE BIO SURGEON STRL SZ 6.5 (GLOVE) ×1 IMPLANT
GLOVE BIOGEL PI IND STRL 6 (GLOVE) ×4 IMPLANT
GLOVE BIOGEL PI IND STRL 6.5 (GLOVE) ×2 IMPLANT
GLOVE BIOGEL PI IND STRL 7.0 (GLOVE) ×1 IMPLANT
GLOVE BIOGEL PI MICRO STRL 5.5 (GLOVE) ×1 IMPLANT
GLOVE ECLIPSE 6.0 STRL STRAW (GLOVE) ×1 IMPLANT
GLOVE SS PI 5.5 STRL (GLOVE) ×3 IMPLANT
GOWN STRL REUS W/ TWL LRG LVL3 (GOWN DISPOSABLE) ×5 IMPLANT
GOWN STRL REUS W/TWL LRG LVL3 (GOWN DISPOSABLE) ×1 IMPLANT
GRASPER SUT TROCAR 14GX15 (MISCELLANEOUS) IMPLANT
HOLDER FOLEY CATH W/STRAP (MISCELLANEOUS) ×2 IMPLANT
IRRIGATION SUCT STRKRFLW 2 WTP (MISCELLANEOUS) ×1 IMPLANT
KIT PROCEDURE DVNC SI (MISCELLANEOUS) IMPLANT
KIT TURNOVER CYSTO (KITS) ×2 IMPLANT
KIT TURNOVER KIT A (KITS) ×1 IMPLANT
KIT TURNOVER KIT B (KITS) ×1 IMPLANT
LIGASURE IMPACT 36 18CM CVD LR (INSTRUMENTS) IMPLANT
LOOP VESSEL MAXI BLUE (MISCELLANEOUS) IMPLANT
MANIPULATOR ADVINCU DEL 3.0 PL (MISCELLANEOUS) IMPLANT
MANIPULATOR ADVINCU DEL 3.5 PL (MISCELLANEOUS) IMPLANT
MANIPULATOR UTERINE 4.5 ZUMI (MISCELLANEOUS) IMPLANT
MARKER SKIN DUAL TIP RULER LAB (MISCELLANEOUS) ×1 IMPLANT
NDL HYPO 21X1.5 SAFETY (NEEDLE) ×1 IMPLANT
NDL HYPO 22X1.5 SAFETY MO (MISCELLANEOUS) ×2 IMPLANT
NDL MAYO 6 CRC TAPER PT (NEEDLE) IMPLANT
NDL SPNL 18GX3.5 QUINCKE PK (NEEDLE) IMPLANT
NDL SPNL 20GX3.5 QUINCKE YW (NEEDLE) ×1 IMPLANT
NEEDLE HYPO 21X1.5 SAFETY (NEEDLE) ×1 IMPLANT
NEEDLE HYPO 22X1.5 SAFETY MO (MISCELLANEOUS) ×2 IMPLANT
NEEDLE MAYO 6 CRC TAPER PT (NEEDLE) IMPLANT
NEEDLE SPNL 18GX3.5 QUINCKE PK (NEEDLE) IMPLANT
NEEDLE SPNL 20GX3.5 QUINCKE YW (NEEDLE) ×1 IMPLANT
NS IRRIG 1000ML POUR BTL (IV SOLUTION) ×2 IMPLANT
OBTURATOR OPTICALSTD 8 DVNC (TROCAR) ×1 IMPLANT
PACK ROBOT GYN CUSTOM WL (TRAY / TRAY PROCEDURE) ×1 IMPLANT
PACK VAGINAL WOMENS (CUSTOM PROCEDURE TRAY) ×2 IMPLANT
PAD OB MATERNITY 11 LF (PERSONAL CARE ITEMS) ×1 IMPLANT
PAD POSITIONING PINK XL (MISCELLANEOUS) ×1 IMPLANT
PORT ACCESS TROCAR AIRSEAL 12 (TROCAR) IMPLANT
RETRACTOR LONE STAR DISPOSABLE (INSTRUMENTS) ×2 IMPLANT
RETRACTOR STAY HOOK 5MM (MISCELLANEOUS) ×2 IMPLANT
SCISSORS LAP 5X45 EPIX DISP (ENDOMECHANICALS) IMPLANT
SCISSORS MNPLR CVD DVNC XI (INSTRUMENTS) ×1 IMPLANT
SCRUB CHG 4% DYNA-HEX 4OZ (MISCELLANEOUS) ×2 IMPLANT
SEAL UNIV 5-12 XI (MISCELLANEOUS) ×4 IMPLANT
SET CYSTO W/LG BORE CLAMP LF (SET/KITS/TRAYS/PACK) ×1 IMPLANT
SET IRRIG Y-TYPE CYSTO (SET/KITS/TRAYS/PACK) ×2 IMPLANT
SET TRI-LUMEN FLTR TB AIRSEAL (TUBING) ×1 IMPLANT
SLEEVE SCD COMPRESS KNEE MED (STOCKING) ×2 IMPLANT
SOLN 0.9% NACL POUR BTL 1000ML (IV SOLUTION) ×1 IMPLANT
SPIKE FLUID TRANSFER (MISCELLANEOUS) ×1 IMPLANT
SPONGE T-LAP 18X18 ~~LOC~~+RFID (SPONGE) IMPLANT
STAPLER PROXIMATE 75MM BLUE (STAPLE) IMPLANT
SUCTION TUBE FRAZIER 10FR DISP (SUCTIONS) ×2 IMPLANT
SURGIFLO W/THROMBIN 8M KIT (HEMOSTASIS) IMPLANT
SUT ABS MONO DBL WITH NDL 48IN (SUTURE) IMPLANT
SUT MNCRL AB 4-0 PS2 18 (SUTURE) IMPLANT
SUT MON AB 2-0 SH27 (SUTURE) IMPLANT
SUT PDS AB 1 TP1 96 (SUTURE) IMPLANT
SUT PDS AB 2-0 CT1 27 (SUTURE) IMPLANT
SUT PDS AB 2-0 CT2 27 (SUTURE) IMPLANT
SUT PDS PLUS AB 0 CT-2 (SUTURE) ×2 IMPLANT
SUT STRATA PDS 0 30 CT-2.5 (SUTURE) IMPLANT
SUT STRATAFIX PDS+0 CT1 9 (SUTURE) IMPLANT
SUT V-LOC 180 0-0 GS22 (SUTURE) IMPLANT
SUT VIC AB 0 CT1 18XCR BRD 8 (SUTURE) IMPLANT
SUT VIC AB 0 CT1 27XBRD ANBCTR (SUTURE) IMPLANT
SUT VIC AB 0 CT1 27XBRD ANTBC (SUTURE) IMPLANT
SUT VIC AB 0 CT1 27XCR 8 STRN (SUTURE) IMPLANT
SUT VIC AB 0 CT1 36 (SUTURE) IMPLANT
SUT VIC AB 2-0 CT1 36 (SUTURE) IMPLANT
SUT VIC AB 2-0 CT1 TAPERPNT 27 (SUTURE) IMPLANT
SUT VIC AB 2-0 CT2 27 (SUTURE) IMPLANT
SUT VIC AB 2-0 SH 27X BRD (SUTURE) IMPLANT
SUT VIC AB 2-0 SH 27XBRD (SUTURE) ×1 IMPLANT
SUT VIC AB 3-0 SH 18 (SUTURE) IMPLANT
SUT VIC AB 3-0 SH 27X BRD (SUTURE) IMPLANT
SUT VIC AB 4-0 PS2 18 (SUTURE) ×2 IMPLANT
SUT VICRYL 0 27 CT2 27 ABS (SUTURE) ×1 IMPLANT
SUT VICRYL 2-0 SH 8X27 (SUTURE) ×1 IMPLANT
SUT VLOC 180 0 9IN GS21 (SUTURE) IMPLANT
SUTURE STRATFX 0 PDS+ CT-2 23 (SUTURE) IMPLANT
SYR 10ML LL (SYRINGE) IMPLANT
SYR 20ML LL LF (SYRINGE) IMPLANT
SYR BULB EAR ULCER 3OZ GRN STR (SYRINGE) ×2 IMPLANT
SYR CONTROL 10ML LL (SYRINGE) ×1 IMPLANT
SYSTEM BAG RETRIEVAL 10MM (BASKET) IMPLANT
SYSTEM RETRIEVL 5MM INZII UNIV (BASKET) IMPLANT
SYSTEM WOUND ALEXIS 18CM MED (MISCELLANEOUS) IMPLANT
TOWEL GREEN STERILE (TOWEL DISPOSABLE) ×1 IMPLANT
TOWEL OR 17X24 6PK STRL BLUE (TOWEL DISPOSABLE) ×2 IMPLANT
TRAP SPECIMEN MUCUS 40CC (MISCELLANEOUS) IMPLANT
TRAY FOLEY MTR SLVR 14FR STAT (SET/KITS/TRAYS/PACK) ×1 IMPLANT
TRAY FOLEY MTR SLVR 16FR STAT (SET/KITS/TRAYS/PACK) ×1 IMPLANT
TRAY FOLEY W/BAG SLVR 14FR LF (SET/KITS/TRAYS/PACK) ×1 IMPLANT
TROCAR PORT AIRSEAL 5X120 (TROCAR) IMPLANT
TROCAR XCEL NON-BLD 5MMX100MML (ENDOMECHANICALS) IMPLANT
UNDERPAD 30X36 HEAVY ABSORB (UNDERPADS AND DIAPERS) ×2 IMPLANT
WATER STERILE IRR 1000ML POUR (IV SOLUTION) ×1 IMPLANT
YANKAUER SUCT BULB TIP 10FT TU (MISCELLANEOUS) ×2 IMPLANT
YANKAUER SUCT BULB TIP NO VENT (SUCTIONS) ×1 IMPLANT

## 2024-01-14 NOTE — Brief Op Note (Signed)
 01/14/2024  5:42 PM  PATIENT:  Miranda Hill  58 y.o. female  PRE-OPERATIVE DIAGNOSIS:  OVARIAN CANCER uterovaginal prolpase unspecified,  mixed incontinence, Nocturia and history of miduretheral sling  POST-OPERATIVE DIAGNOSIS:  OVARIAN CANCERuterovaginal prolpase unspecified, mixed incontinence, Nocturia and history of miduretheral sling  PROCEDURE:  Procedures with comments: LAPAROSCOPY, DIAGNOSTIC (N/A) HYSTERECTOMY, TOTAL, ABDOMINAL, WITH BILATERAL SALPINGO-OOPHORECTOMY, WITH NEOPLASM DEBULKING, APPENDECTOMY, OMENECTOMY (N/A) SUSPENSION, VAGINAL VAULT (N/A) ANTERIOR (CYSTOCELE) AND POSTERIOR REPAIR (RECTOCELE) (N/A) PERINEOPLASTY (N/A) - perineorrhaphy CREATION, PUBOVAGINAL SLING (N/A) CYSTOSCOPY (N/A) - cystourethroscopy possible urethrolysis  SURGEON:  Surgeons and Role: Panel 1:    DEWAINE Viktoria Comer JONELLE, MD - Primary Panel 2:    * Guadlupe Lianne DASEN, MD - Primary  ASSISTANTS: Eleanor Epps, NP   ANESTHESIA:   general  EBL:  325 mL   BLOOD ADMINISTERED: none  DRAINS: 91F JP in pelvis, NGT   LOCAL MEDICATIONS USED:  TAP block with Exparel , 0.25% marcaine   SPECIMEN:    DISPOSITION OF SPECIMEN:  omentum, uterus, cervix, tubes and ovaries, bladder peritoneum, left pelvic sidewall peritoneum, posterior cul de sac nodules, right pelvic sidewall peritoneum, left abdominal wall peritoneum, appendix, small bowel mesenteric nodules.  COUNTS:  YES  TOURNIQUET:  * No tourniquets in log *  DICTATION: .Note written in EPIC  PLAN OF CARE: Admit to inpatient   PATIENT DISPOSITION:  PACU - hemodynamically stable.   Delay start of Pharmacological VTE agent (>24hrs) due to surgical blood loss or risk of bleeding: no

## 2024-01-14 NOTE — Anesthesia Procedure Notes (Signed)
 Anesthesia Regional Block: TAP block   Pre-Anesthetic Checklist: , timeout performed,  Correct Patient, Correct Site, Correct Laterality,  Correct Procedure, Correct Position, site marked,  Risks and benefits discussed,  Surgical consent,  Pre-op evaluation,  At surgeon's request and post-op pain management  Laterality: Left and Right  Prep: Maximum Sterile Barrier Precautions used, chloraprep       Needles:  Injection technique: Single-shot  Needle Type: Echogenic Needle      Needle Gauge: 20     Additional Needles:   Procedures:,,,, ultrasound used (permanent image in chart),,    Narrative:  Start time: 01/14/2024 9:50 AM End time: 01/14/2024 10:00 AM Injection made incrementally with aspirations every 5 mL.  Performed by: Personally  Anesthesiologist: Keneth Lynwood POUR, MD

## 2024-01-14 NOTE — Anesthesia Procedure Notes (Signed)
 Procedure Name: Intubation Date/Time: 01/14/2024 11:53 AM  Performed by: Belvie Valri NOVAK, CRNAPre-anesthesia Checklist: Patient identified, Emergency Drugs available, Suction available and Patient being monitored Patient Re-evaluated:Patient Re-evaluated prior to induction Oxygen Delivery Method: Circle System Utilized Preoxygenation: Pre-oxygenation with 100% oxygen Induction Type: IV induction Ventilation: Mask ventilation without difficulty and Oral airway inserted - appropriate to patient size Laryngoscope Size: Mac and 3 Grade View: Grade II Tube type: Oral Tube size: 7.0 mm Number of attempts: 1 Airway Equipment and Method: Stylet and Oral airway Placement Confirmation: ETT inserted through vocal cords under direct vision, positive ETCO2 and breath sounds checked- equal and bilateral Secured at: 22 cm Tube secured with: Tape Dental Injury: Teeth and Oropharynx as per pre-operative assessment

## 2024-01-14 NOTE — Plan of Care (Signed)
  Problem: Pain Managment: Goal: General experience of comfort will improve and/or be controlled Outcome: Progressing

## 2024-01-14 NOTE — Transfer of Care (Signed)
 Immediate Anesthesia Transfer of Care Note  Patient: Miranda Hill  Procedure(s) Performed: LAPAROSCOPY, DIAGNOSTIC HYSTERECTOMY, TOTAL, ABDOMINAL, WITH BILATERAL SALPINGO-OOPHORECTOMY, WITH NEOPLASM DEBULKING, APPENDECTOMY, OMENECTOMY SUSPENSION, VAGINAL VAULT ANTERIOR (CYSTOCELE) AND POSTERIOR REPAIR (RECTOCELE) PERINEOPLASTY CREATION, PUBOVAGINAL SLING CYSTOSCOPY  Patient Location: PACU  Anesthesia Type:GA combined with regional for post-op pain  Level of Consciousness: awake and alert   Airway & Oxygen Therapy: Patient Spontanous Breathing and Patient connected to face mask oxygen  Post-op Assessment: Report given to RN and Post -op Vital signs reviewed and stable  Post vital signs: Reviewed and stable  Last Vitals:  Vitals Value Taken Time  BP 124/79 01/14/24 17:46  Temp    Pulse 99 01/14/24 17:50  Resp 9 01/14/24 17:50  SpO2 100 % 01/14/24 17:50  Vitals shown include unfiled device data.  Last Pain:  Vitals:   01/14/24 0904  TempSrc:   PainSc: 0-No pain         Complications: No notable events documented.

## 2024-01-14 NOTE — Op Note (Signed)
 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   Surgeon: Viktoria Crank MD   Assistant Surgeon: Eleanor Epps, NP (an NP assistant was necessary for tissue manipulation, management of instrumentation, retraction and positioning due to the complexity of the case and hospital policies).    Anesthesia: GET   Urine Output: 250 cc   Operative Findings: On diagnostic laparoscopy, there was minimal miliary disease involving the right diaphragm.  Liver surface and stomach smooth.  Omentum with a large cake in the central abdomen and adherent to the left mid abdominal wall.  Within the pelvis, some nodules measuring up to approximately 1 cm noted within the cul-de-sac and on the mesorectal peritoneum.  Atrophic and normal-appearing uterus, bilateral tubes and ovaries.  Some miliary disease along pelvic peritoneum and bladder peritoneum.  Upon laparotomy, large omental cake noted measuring 10-15 cm.  Tumor quite adherent to the transverse colon mesentery with almost complete obliteration of the lesser sac and some areas. There was no disease involving the spleen.  A small, 2 cm splenic nodule noted just inferior to the spleen.  Some thickened omentum was noted medial to the spleen but the hilum itself was spared. Miliary disease noted on small bowel mesentery with most nodules measuring between 1 and 4 mm.  Larger nodules resected.  Many of the smaller nodules ablated with the argon beam.  R1 resection.   Estimated Blood Loss:  250 cc         Total IV Fluids: see I&O flowsheet         Specimens: omentum, small bowel mesenteric nodules, cysts, cervix, bilateral tubes and ovaries, bladder peritoneum, left pelvic sidewall peritoneum, right pelvic peritoneum, posterior cul-de-sac nodules, left sidewall peritoneum         Complications:  None apparent; patient tolerated the procedure well.         Disposition: PACU - hemodynamically stable.   Procedure Details  The patient was seen in the Holding Room. The risks, benefits, complications, treatment options, and expected outcomes were discussed with the patient.  The patient concurred with the proposed plan, giving informed consent.  The site of surgery properly noted/marked. The patient was identified as Miranda Hill and the procedure verified as a diagnostic laparoscopy with likely conversion to open interval debulking surgery.    After induction of anesthesia, the patient was draped and prepped in the usual sterile manner. Patient was placed in supine position after anesthesia and draped and prepped in the usual sterile manner as follows: Her left arm was tucked to her side with all appropriate precautions.  The patient was secured to the bed using a strap across her chest.  The patient was placed in the semi-lithotomy position in [place] stirrups.  The perineum and vagina were prepped with CHG. The patient's abdomen was prepped with Chloraprep and then she was draped after the prep had been allowed to dry for 3 minutes.  A Time Out was held and the above information confirmed.   The urethra was prepped with Betadine . Foley catheter was placed.  OG tube placement was confirmed and to suction.    Next, a 5 mm skin incision  was made 1 cm below the subcostal margin in the midclavicular line.  The 5 mm Optiview port and scope was used for direct entry.  Opening pressure was under 10 mm CO2.  The abdomen was insufflated and the findings were noted as above.  The decision was made to proceed  with exploratory laparotomy.    With the abdomen still insufflated, a vertical midline incision was and carried down to the underlying fascia using Bovie cautery.  The fascia was scored in the fascial incision was extended superiorly and inferiorly using Bovie cautery.  The peritoneum was tented and entered. The peritoneal incision was extended superiorly and inferiorly with visualization of the underlying peritoneal cavity.     Attention was first turned to the omentum.  Monopolar electrocautery was used to separate the omentum from the transverse colon.  This was quite challenging along portions of the transverse colon due to significant tumor involvement and obliteration of the plane between the omentum and the mesentery.  Ultimately, the lesser sac was developed.  A combination of monopolar and bipolar electrocautery were then used to transect the omentum along the greater curvature of the stomach, necessitating cauterization and transection of many of the short gastrics.  Laterally on the left, the colon and omentum was freed from some minor adhesions to the left abdominal sidewall, mobilizing the colon along the white line of Toldt.  Superiorly, the spleen itself felt free of disease.  There was some disease within the omentum medial to the spleen (within the gastrosplenic ligament).  The superior aspect of this disease was identified and a window made.  Bipolar electrocautery was used to cauterize and transect, ultimately freeing the superior aspect of the omentum.  Once the omentum had been completely resected, it was handed off the field.   The upper abdomen was then explored both visually and with palpation.  The small bowel was then run from the cecum to the ligament of Treitz.  Very small but diffuse miliary disease was noted from the small bowel mesentery.  The small bowel itself was spared.  There was some disease involving the appendix.  The appendix and the terminal ileum are somewhat  tethered at the pelvic brim.  Monopolar electrocautery was used to mobilize the cecum, appendix, and terminal ileum.   The Bookwalter self-retaining retractor was then placed. At the initial placement as well as at several points during the case the lateral blades were checked to ensure no significant pressure on the psoas bellies.  Small bowel was packed into the upper abdomen.   Some adhesions of the colon mesentery to the left pelvis and left pelvic brim were noted.  These were taken down monopolar electrocautery.  The peritoneum over the lateral mesentery on the left appeared to have some treatment effect versus very small volume disease.  This was elevated and resected using monopolar electrocautery.     The round ligament on the patient's right side was transected with monopolar cautery the anterior posterior leaves the broad ligament were opened. A window was made between the infundibulopelvic vessels and the ureter. The vessels were clamped, cauterized using bipolar electrocautery, transected and suture ligated. The bladder flap was created at this point and the uterine vessels on the right side were skeletonized. The uterine vessels were clamped with curved Masterson clamps, cauterized with bipolar electrocautery, transected and suture ligated. Our attention was turned to the left side were similar procedure was performed in that the round ligament was transected and the anterior and posterior  leaves the broad ligament were opened. A window was made between the vessels and the ureter on the left and the vessels were clamped x2 transected and suture ligated.  Bilateral utero-ovarian ligaments and fallopian tubes were cauterized and transected using bipolar electrocautery.  Right and left adnexa were then handed off the field.   At this point, Dr. Guadlupe to identify and take the uterosacral ligaments.  Given disease along the bladder peritoneum, the peritoneum was stripped using monopolar  electrocautery.  This was then handed off the field.  Bladder flap was developed without difficulty using monopolar electrocautery. The uterine vessels were skeletonized, clamped, cauterized with bipolar electrocautery, transected and suture ligated. We continued down the cardinal ligaments with straight Masterson clamps the cervicovaginal junction was reached.  With a sponge stick in the vagina, the cervical vaginal junction was identified and the vagina was entered anteriorly is using monopolar electrocautery.  Curved clamps were then used to clamp laterally, just inferior to the cervico vaginal junction.  These pedicles were then cut and suture-ligated using 0 Vicryl.  Using monopolar electrocautery, the cervix was amputated from the vagina and the uterus and cervix were handed off the field. The angle pedicles were closed using figure-of-eight sutures of 0 Vicryl.  The midportion of the cuff was left open for the uterosacral ligament suspension.     Attention was then turned to the other peritoneal surfaces in the pelvis including the right deep pelvic sidewall, the peritoneum overlying the rectum, the peritoneum over the mesorectum, and the left pelvic sidewall.  Bilateral pelvic sidewall peritoneum was stripped using monopolar electrocautery.  The peritoneum over the rectum and the mesorectum was coagulated using the argon beam short bursts of activation at an appropriate distance from the tissue.  Once the peritoneal disease in the pelvis had all been treated, the surgery was turned back over to Dr. Guadlupe for the presacral ligament suspension and vaginal cuff closure.  Pelvis was irrigated copiously with excellent hemostasis noted.  There was some miliary disease along the left lateral abdominal sidewall that was resected using monopolar electrocautery and handed off the field.  Dr. Guadlupe then performed cystoscopy.  She continued her procedure transvaginally, we continued intra-abdominally.  Attention  was then turned back to the mid abdomen.  Bowel was run again and decision made to proceed with appendectomy.  The appendix was skeletonized and bipolar electrocautery was used to control the appendiceal artery.  At its base, the appendix was stapled and transected using a GIA stapler and the appendix handed off the field.  2-0 Vicryl was then used to form a pursestring stitch around the appendiceal base which was buried with the stitch.  The small bowel was then run again and larger mesenteric lesions were excised using Metzenbaum scissors.  Smaller miliary disease was coagulated using the argon beam.  The miliary disease along the right mid abdominal sidewall and along the right diaphragm were similarly coagulated using the argon beam.  The abdomen was irrigated again with good hemostasis noted.  Attention was turned back to the pelvis.  Good hemostasis maintained.  Floseal was placed along bilateral pelvic sidewalls.  OG tube was converted to an NG tube and placement was verified by palpation to be within the stomach.  44 French JP drain was brought out through the laparoscopic incision and secured to the skin using a drain stitch.  Drain was placed along the left sidewall and in the pelvis.   Bookwalter retractor was removed.  Sweep was performed to  ensure all lap sponges had been removed.  The fascia was closed using running mass closure of #1 PDS. The subcutaneous tissues were irrigated and made hemostatic. The subcutaneous tissue was reapproximated with 2-0 Vicryl. The subcutaneous tissue of the laparotomy was closed with 4-0 Vicryl and the skin was closed in subcuticular fashion with 4-0 Monocryl. The laparotomy incision was covered with Dermabond followed by an [employer] dressing.   All sponge, lap and needle counts were correct x 3.    The patient was transferred to the recovery room in stable condition.   Comer Dollar, MD

## 2024-01-14 NOTE — Anesthesia Preprocedure Evaluation (Signed)
 Anesthesia Evaluation  Patient identified by MRN, date of birth, ID band Patient awake    Reviewed: Allergy & Precautions, NPO status , Patient's Chart, lab work & pertinent test results, reviewed documented beta blocker date and time   History of Anesthesia Complications (+) history of anesthetic complications  Airway Mallampati: III  TM Distance: >3 FB     Dental no notable dental hx.    Pulmonary neg shortness of breath, sleep apnea    breath sounds clear to auscultation       Cardiovascular (-) angina (-) CAD, (-) Past MI and (-) Cardiac Stents  Rhythm:Regular Rate:Normal     Neuro/Psych neg Seizures PSYCHIATRIC DISORDERS  Depression     Neuromuscular disease    GI/Hepatic hiatal hernia,GERD  Medicated,,(+) neg Cirrhosis        Endo/Other    Renal/GU Renal disease     Musculoskeletal   Abdominal   Peds  Hematology  (+) Blood dyscrasia, anemia   Anesthesia Other Findings   Reproductive/Obstetrics                              Anesthesia Physical Anesthesia Plan  ASA: 3  Anesthesia Plan: General   Post-op Pain Management:    Induction: Intravenous  PONV Risk Score and Plan: 4 or greater and Ondansetron , Propofol  infusion and TIVA  Airway Management Planned: Oral ETT  Additional Equipment:   Intra-op Plan:   Post-operative Plan: Extubation in OR  Informed Consent: I have reviewed the patients History and Physical, chart, labs and discussed the procedure including the risks, benefits and alternatives for the proposed anesthesia with the patient or authorized representative who has indicated his/her understanding and acceptance.     Dental advisory given  Plan Discussed with: CRNA  Anesthesia Plan Comments:          Anesthesia Quick Evaluation

## 2024-01-14 NOTE — Op Note (Signed)
 Operative Note  Preoperative Diagnosis: Stage III pelvic organ prolapse, history of rectus fascial sling  Postoperative Diagnosis: Stage III pelvic organ prolapse, history of rectus fascial sling  Procedures performed:  Exam under anesthesia, abdominal uterosacral ligament vaginal vault suspension, posterior repair, perineoplasty, and cystoscopy   Implants: * No implants in log *  Attending Surgeon: Lianne Leila Gillis, MD  Assistant Surgeon: Comer Dollar, MD  Assistant: n/a  Anesthesia: General endotracheal  Findings: 1. On vaginal exam, stage III prolapse present  2. On cystoscopy, normal bladder and urethral mucosa without injury or lesion. Brisk bilateral ureteral efflux present.    Specimens:  ID Type Source Tests Collected by Time Destination  A :  Tissue PATH Soft tissue  Dollar Comer SAUNDERS, MD 01/14/2024 1307     Estimated blood loss: 25 mL  IV fluids: 2200 mL  Urine output: 700 mL  Complications: none  Procedure in Detail: After informed consent was obtained, the patient was taken to the operating room where she was placed under anesthesia.  She was then placed in the dorsal lithotomy position with Allen stirrups and prepped and draped in the usual sterile fashion.  Care was taken to avoid hyperflexion or hyperextension of her lower extremities.  A foley catheter was placed. Please refer to Dr. Lewie operative report regarding diagnostic laparoscopy, abdominal hysterectomy with bilateral salpingoophorectomy, omentectomy, appendectomy, debulking and SLN, small bowel mesenteric nodule, bladder peritoneum, posterior cul-de-sac, bilateral pelvic sidewall biopsy bilaterally. The vaginal cuff was closed in a continuous running fashion with O-PDS sutures. After completion of vaginal cuff closure, the right and left uterosacral ligaments were identified visually and palpated. Two stitches of 0 PDS were placed and tied down through each uterosacral ligament towards its insertion  site at the sacrum through the vaginal cuff at the apex and midline bilaterally, while traction was placed on the uterosacral ligament using an Allis clamp.  The foley catheter was removed.  A 70-degree cystoscope was introduced, and 360-degree inspection revealed no trauma in the bladder, with bilateral ureteral efflux with tension on the uterosacral sutures.  No sutures, lesions, or foreign material noted. The distention fluid was drained and the cystoscope was removed. The Foley catheter was reinserted.    Vaginal exam showed remaining posterior wall prolapse with resolution of anterior wall prolapse and a well suspended apex.    Attention was then turned to the posterior vagina.  A lonestar self-retraining retractor was placed with 4 stay hooks. Two Allis clamps were placed at the introitus approximately 3cm from the urethra meatus. Injectable normal saline was injected into the vaginal mucosa in the posterior vaginal wall and perineum for hydrodissection and hemostasis. A vertical incision was made between these clamps with a 15 blade scalpel and a diamond shaped area of epithelium was cut at the introitus.  The rectovaginal septum was then dissected off the vaginal mucosa bilaterally. The rectovaginal septum was then plicated in a continuous running fashion with 2-0 PDS while one finger was placed in the rectum to prevent rectal penetration.  After placement of the first plication stitch two fingers were inserted into the vaginal to confirm adequate caliber.  The suture incorporated the perineal body in a U stitch fashion and the bulbocavernosus muscles. A 2-0 Vicryl was used in a subcuticular fashion to re-approximate the hymenal ring. After plication, the excess vaginal mucosa was trimmed and the vaginal mucosa was reapproximated using 2-0 Vicryl sutures.  The vagina was copiously irrigated.  Hemostasis was noted. A rectal examination was normal  and confirmed no sutures within the rectum. Three fingers  passed through the vaginal opening without difficulty. Abdominal incision closure was performed by Dr. Viktoria.   The patient tolerated the procedure well.  She was awakened from anesthesia and transferred to the recovery room in stable condition. All needle and sponge counts were correct x 2.

## 2024-01-14 NOTE — Interval H&P Note (Signed)
 History and Physical Interval Note:  01/14/2024 10:55 AM  Miranda Hill  has presented today for surgery, with the diagnosis of OVARIAN CANCER uterovaginal prolpase unspecified,  mixed incontinence, Nocturia and history of miduretheral sling.  The various methods of treatment have been discussed with the patient and family. After consideration of risks, benefits and other options for treatment, the patient has consented to  Procedures with comments: LAPAROSCOPY, DIAGNOSTIC (N/A) HYSTERECTOMY, TOTAL, ABDOMINAL, WITH BILATERAL SALPINGO-OOPHORECTOMY, WITH NEOPLASM DEBULKING (N/A) SUSPENSION, VAGINAL VAULT (N/A) ANTERIOR (CYSTOCELE) AND POSTERIOR REPAIR (RECTOCELE) (N/A) PERINEOPLASTY (N/A) - perineorrhaphy CREATION, PUBOVAGINAL SLING (N/A) CYSTOSCOPY (N/A) - cystourethroscopy possible urethrolysis as a surgical intervention.  The patient's history has been reviewed, patient examined, no change in status, stable for surgery.  I have reviewed the patient's chart and labs.  Questions were answered to the patient's satisfaction.    Reports resolution of urinary leakage and incomplete emptying sensation 4 days after starting Trospium . Postpone resuming medication until after passing void trial.  Will proceed without anti-incontinence procedure, and reviewed possible staged procedure if needed postop.  Miranda Hill

## 2024-01-14 NOTE — H&P (Signed)
 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 Request for therapeutic paracentesis. EXAM: ULTRASOUND GUIDED THERAPEUTIC PARACENTESIS MEDICATIONS: 6 mL 1% lidocaine  COMPLICATIONS: None immediate. PROCEDURE: Informed written consent was obtained from the patient after a discussion of the risks, benefits and alternatives to treatment. A timeout was performed prior to the initiation of the procedure. Initial ultrasound scanning demonstrates a large amount of ascites within the right lower abdominal quadrant. The right lower abdomen was prepped and draped in the usual sterile fashion. 1% lidocaine  was used for local anesthesia. Following this, a 19 gauge, 7-cm, Yueh catheter was introduced. An ultrasound image was saved for documentation purposes. The paracentesis was performed. The catheter was removed and a dressing was applied. The patient tolerated the procedure well without immediate post procedural complication. FINDINGS: A total of approximately 3.2 L of clear, amber fluid was removed. IMPRESSION: Successful ultrasound-guided paracentesis yielding 3.2 liters of peritoneal fluid. Performed by Clotilda Hesselbach, PA-C Electronically Signed   By: Ester Sides M.D.   On: 10/01/2023 12:46   NM PET Image Initial (PI) Skull Base To Thigh (F-18 FDG) Result Date: 09/25/2023 CLINICAL DATA:  Initial treatment strategy for omental and peritoneal nodularity compatible with carcinomatosis. EXAM: NUCLEAR MEDICINE PET SKULL BASE TO THIGH TECHNIQUE: 9.4 mCi F-18 FDG was injected intravenously. Full-ring PET imaging was performed from the skull base to thigh after the radiotracer. CT data was  obtained and used for attenuation correction and anatomic localization. Fasting blood glucose: 116 mg/dl COMPARISON:  CT scan 1/74/7974 FINDINGS: Mediastinal blood pool activity: SUV max 2.4 Liver activity: SUV max NA NECK: No significant abnormal hypermetabolic activity in this region. Incidental CT findings: None. CHEST: Small type 1 hiatal hernia, accompanied by a small amount of ascites, faint metabolic activity in the ascites with maximum SUV 2.3, cannot exclude malignant ascites extension up through the hiatus. Incidental CT findings: Mild scarring or subsegmental atelectasis anteriorly in the right upper lobe. ABDOMEN/PELVIS: Heavy burden of hypermetabolic omental caking with malignant ascites and numerous foci of hypermetabolic activity along the liver capsule, right paracolic gutter, left paracolic gutter, mesentery, and pelvic ascites. A representative region of the omental caking in the left lower quadrant anterior to the descending colon a maximum SUV of 14.6. A hypermetabolic tumor deposit in the pelvic ascites anterior to the rectum on image 171 series 4 has maximum SUV of 16.0 and measures 2.7 cm in long axis. Hypermetabolic focus along the posterosuperior liver margin maximum SUV 7.8. Isolation of primary site is problematic given the tumor deposits along the cecum and expected location of the appendix as well as the adnexa. Overall moderate amount of malignant ascites. Incidental CT findings: None. SKELETON: No significant abnormal hypermetabolic activity in this region. Incidental CT findings: None. IMPRESSION: 1. Heavy burden of hypermetabolic omental caking with malignant ascites and numerous foci of hypermetabolic activity along the liver capsule, right paracolic gutter, left paracolic gutter, mesentery, and pelvic ascites. Isolation of primary site is problematic given the tumor deposits along the cecum and expected location of the appendix as well as the adnexa. Overall moderate amount of  malignant ascites. 2. Small type 1 hiatal hernia, accompanied by a small amount of ascites, faint metabolic activity in the ascites with  maximum SUV 2.3, cannot exclude malignant ascites extension up through the hiatus. Electronically Signed   By: Ryan Salvage M.D.   On: 09/25/2023 16:59   US  Paracentesis Result Date: 09/24/2023 INDICATION: other ascites Patient with history of bloating, abdominal distension, imaging findings of extensive omental and peritoneal nodularity, ascites; request received for diagnostic and therapeutic paracentesis. EXAM: ULTRASOUND GUIDED DIAGNOSTIC AND THERAPEUTIC PARACENTESIS MEDICATIONS: 8 mL 1% lidocaine  with epinephrine  COMPLICATIONS: None immediate. PROCEDURE: Informed written consent was obtained from the patient after a discussion of the risks, benefits and alternatives to treatment. A timeout was performed prior to the initiation of the procedure. Initial ultrasound scanning demonstrates a large amount of ascites within the LEFT lower abdominal quadrant. The left lower abdomen was prepped and draped in the usual sterile fashion. 1% lidocaine  was used for local anesthesia. Following this, a 6 Fr Safe-T-Centesis catheter was introduced. An ultrasound image was saved for documentation purposes. The paracentesis was performed. The catheter was removed and a dressing was applied. The patient tolerated the procedure well without immediate post procedural complication. FINDINGS: A total of approximately 4.7 L of hazy, blood-tinged fluid was removed. Samples were sent to the laboratory as requested by the clinical team. IMPRESSION: Successful ultrasound-guided diagnostic and therapeutic paracentesis yielding 4.7 L of peritoneal fluid. Performed by: Franky Rakers, PA-C Electronically Signed   By: Thom Hall M.D.   On: 09/24/2023 17:10   CT ABDOMEN PELVIS W CONTRAST Result Date: 09/22/2023 CLINICAL DATA:  Bloating.  Distended abdomen. * Tracking Code: BO * EXAM: CT ABDOMEN AND  PELVIS WITH CONTRAST TECHNIQUE: Multidetector CT imaging of the abdomen and pelvis was performed using the standard protocol following bolus administration of intravenous contrast. RADIATION DOSE REDUCTION: This exam was performed according to the departmental dose-optimization program which includes automated exposure control, adjustment of the mA and/or kV according to patient size and/or use of iterative reconstruction technique. CONTRAST:  ISOVUE -300 IOPAMIDOL  (ISOVUE -300) INJECTION 61% COMPARISON:  None Available. FINDINGS: Lower chest: Small focus of atelectasis in the inferior lingula. No pulmonary nodules. Hepatobiliary: No focal hepatic lesion. Low-density gallstone noted. No sickle size. Pancreas: Pancreas is normal. No ductal dilatation. No pancreatic inflammation. Spleen: Normal spleen Adrenals/urinary tract: Adrenal glands and kidneys are normal. The ureters and bladder normal. Stomach/Bowel: Stomach is normal. The small bowel is floating on moderate volume intraperitoneal free fluid within the small bowel mesentery. No bowel obstruction. Terminal ileum is normal. Appendix not clearly identified. The colon is normal. Contrast travels the entirety of the colon to the rectum. Vascular/Lymphatic: Abdominal aorta is normal caliber. No periportal or retroperitoneal adenopathy. No pelvic adenopathy. Reproductive: Uterus and ovaries are grossly normal. Other: Moderate volume intraperitoneal free fluid. There is extensive nodularity the greater omentum (omental caking, image 58/2 for example). There are peritoneal nodules noted. For example nodule adjacent the RIGHT hepatic lobe measuring 13 mm image 35/2. Peritoneal nodularity in the posterior cul-de-sac on image 84/2. Musculoskeletal: No aggressive osseous lesion. IMPRESSION: 1. 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 2. Moderate volume intraperitoneal free fluid. 3. No clear ovarian or appendiceal neoplasm. No clear  primary malignancy identified. These results will be called to the ordering clinician or representative by the Radiologist Assistant, and communication documented in the PACS or [employer]. Electronically Signed   By: Jackquline Boxer M.D.   On: 09/22/2023 10:36      09/25/2023 Pathology Results   Pathology from malignant ascites came back positive for malignancy but insufficient cellularity for further characterization   10/03/2023  Initial Diagnosis   Carcinomatosis peritonei (HCC)   10/04/2023 Tumor Marker   Patient's tumor was tested for the following markers: CA-125. Results of the tumor marker test revealed 305.   10/08/2023 Procedure   Successful ultrasound-guided diagnostic and therapeutic paracentesis yielding 3.1 liters of peritoneal fluid.   10/08/2023 Pathology Results   CYTOLOGY - NON PAP  CASE: [id number]  PATIENT: Deborah Stanco  Non-Gynecological Cytology Report      Clinical History: None provided  Specimen Submitted:  A. 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 PERITONEAL, LLQ, BIOPSY:  - Metastatic carcinoma, consistent with high-grade serous carcinoma (see comment).   Comment: Immunohistochemical stains are performed.  The tumor cells stain positive for WT1 and PAX8 (patchy), with overexpression of p53. Calretinin is negative.  The combined immunomorphologic features are consistent with high-grade serous carcinoma.    10/10/2023 Cancer Staging   Staging form: Ovary, Fallopian Tube, and Primary Peritoneal Carcinoma, AJCC 8th Edition - Clinical: FIGO Stage IIIC (cT3c, cN0, cM0) - Signed by Lonn Hicks, MD on 10/10/2023 Stage prefix: Initial  diagnosis   10/14/2023 Procedure   Successful ultrasound-guided therapeutic paracentesis yielding 4.4 L liters of peritoneal fluid   10/16/2023 -  Chemotherapy   Patient is on Treatment Plan : OVARIAN Carboplatin  (AUC 6) + Paclitaxel  (175) q21d X 6 Cycles     10/16/2023 Tumor Marker   Patient's tumor was tested for the following markers: CA-125. Results of the tumor marker test revealed 164.   10/23/2023 Procedure   Successful ultrasound-guided therapeutic paracentesis yielding 1.3 liters of peritoneal fluid.   10/24/2023 Tumor Marker   Patient's tumor was tested for the following markers: CA-125. Results of the tumor marker test revealed 163.   11/07/2023 Tumor Marker   Patient's tumor was tested for the following markers: CA-125. Results of the tumor marker test revealed 185.   11/22/2023 Genetic Testing   Negative Common Hereditary Cancers panel +RNA, VUS in CHEK2, c.1270T>C (p.Tyr424His). The [facility] Common Hereditary Cancers panel includes analysis of the following 48 genes: APC, ATM, AXIN2, BAP1, BARD1, BMPR1A, BRCA1, BRCA2, BRIP1, CDH1, CDK4, CDKN2A, CHEK2, CTNNA1, DICER1, EPCAM, FH, GREM1, HOXB13, KIT, MBD4, MEN1, MLH1, MSH2, MSH3, MSH6, MUTYH, NF1, NTHL1, PALB2, PDGFRA, PMS2, POLD1, POLE, PTEN, RAD51C, RAD51D, SDHA, SDHB, SDHC, SDHD, SMAD4, SMARCA4, STK11, TP53, TSC1, TSC2, VHL. Report date 11/22/23.    12/05/2023 Imaging   CT CHEST ABDOMEN PELVIS W CONTRAST Result Date: 12/08/2023 EXAM: CT CHEST, ABDOMEN AND PELVIS WITH CONTRAST 12/05/2023 02:39:41 PM TECHNIQUE: CT of the chest, abdomen and pelvis was performed with the administration of intravenous contrast. Multiplanar reformatted images are provided for review. Automated exposure control, iterative reconstruction, and/or weight based adjustment of the mA/kV was utilized to reduce the radiation dose to as low as reasonably achievable. CONTRAST: 100 mL Iohexol  300. COMPARISON: PET CT dated 09/25/2023. CLINICAL HISTORY: Staging  ovarian cancer assess response to chemo. FINDINGS: CHEST: MEDIASTINUM AND LYMPH NODES: Right chest port terminates in the mid SVC. Heart and pericardium are unremarkable. The central airways are clear. No mediastinal, hilar or axillary lymphadenopathy. LUNGS AND PLEURA: No focal consolidation or pulmonary edema. No pleural effusion or pneumothorax. ABDOMEN AND PELVIS: LIVER: The liver is unremarkable. GALLBLADDER AND BILE DUCTS: Cholelithiasis, without associated  inflammatory changes. No biliary ductal dilatation. SPLEEN: No acute abnormality. PANCREAS: No acute abnormality. ADRENAL GLANDS: No acute abnormality. KIDNEYS, URETERS AND BLADDER: No stones in the kidneys or ureters. No hydronephrosis. No perinephric or periureteral stranding. Urinary bladder is unremarkable. GI AND BOWEL: Stomach demonstrates no acute abnormality. There is no bowel obstruction. Moderate colonic stool burden, suggesting mild constipation. REPRODUCTIVE ORGANS: Uterus is within normal limits. Bilateral ovaries are unremarkable. PERITONEUM AND RETROPERITONEUM: Omental caking peritoneal disease beneath the anterior abdominal wall (image 85), improved. Prior moderate abdominopelvic ascites has resolved. No free air. VASCULATURE: Aorta is normal in caliber. ABDOMINAL AND PELVIS LYMPH NODES: No lymphadenopathy. BONES AND SOFT TISSUES: Mild degenerative changes of the visualized thoracolumbar spine. No acute osseous abnormality. No focal soft tissue abnormality. IMPRESSION: 1. Improved omental caking/peritoneal disease, compatible with partial treatment response. 2. Resolution of prior moderate abdominopelvic ascites. 3. No metastatic disease in the chest. Electronically signed by: Pinkie Pebbles MD 12/08/2023 07:54 PM EST RP Workstation: [id number]      12/12/2023 Tumor Marker   Patient's tumor was tested for the following markers: CA-125. Results of the tumor marker test revealed 36.7.     Interval History: Doing well.  Past  Medical/Surgical History: 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   GERD (gastroesophageal reflux disease)    History of chlamydia    1990   History of hiatal hernia    Multiple thyroid  nodules    Multiple thyroid  nodules    Neuropathy    feet   PONV (postoperative nausea and vomiting)    Pre-diabetes    Prediabetes    Sleep apnea    uses cpap    Past Surgical History:  Procedure Laterality Date   INTRAUTERINE DEVICE INSERTION  07/18/2008   MIRENA   IR IMAGING GUIDED PORT INSERTION  10/17/2023   IR PARACENTESIS  10/01/2023   IR PARACENTESIS  10/06/2023   IR PARACENTESIS  10/17/2023   IR PARACENTESIS  10/23/2023   lymph nodes removed in neck      15 years ago   SALIVARY GLAND SURGERY     ABSCESS   TONSILLECTOMY AND ADENOIDECTOMY     URETHRAL SLING  01/28/1997   WREN    Family History  Problem Relation Age of Onset   Colon cancer Mother 71   Hypertension Mother    Diabetes Mother    Hypertension Father    Diabetes Father    Lung cancer Father 57 - 76       brain mets   Other Sister        hypoglycemic   Colon polyps Sister    Depression Brother    Skin cancer Maternal Uncle    Breast cancer Paternal Aunt        dx >50   Uterine cancer Maternal Grandmother        dx>50 y.o.   Autism spectrum disorder Son     Social History   Socioeconomic History   Marital status: Married    Spouse name: Not on file   Number of children: Not on file   Years of education: Not on file   Highest education level: Associate degree: academic program  Occupational History   Not on file  Tobacco Use   Smoking status: Never   Smokeless tobacco: Never  Vaping Use   Vaping status: Never Used  Substance and Sexual Activity   Alcohol use: Not Currently   Drug use: No   Sexual activity: Yes  Birth control/protection: Post-menopausal    Comment: husband with vasectomy  Other Topics Concern   Not on file  Social History  Narrative   Not on file   Social Drivers of Health   Tobacco Use: Low Risk (01/14/2024)   Patient History    Smoking Tobacco Use: Never    Smokeless Tobacco Use: Never    Passive Exposure: Not on file  Financial Resource Strain: Medium Risk (12/29/2023)   Overall Financial Resource Strain (CARDIA)    Difficulty of Paying Living Expenses: Somewhat hard  Food Insecurity: No Food Insecurity (12/29/2023)   Epic    Worried About Programme Researcher, Broadcasting/film/video in the Last Year: Never true    Ran Out of Food in the Last Year: Never true  Transportation Needs: No Transportation Needs (12/29/2023)   Epic    Lack of Transportation (Medical): No    Lack of Transportation (Non-Medical): No  Physical Activity: Insufficiently Active (12/29/2023)   Exercise Vital Sign    Days of Exercise per Week: 2 days    Minutes of Exercise per Session: 30 min  Stress: No Stress Concern Present (12/29/2023)   [employer] of Occupational Health - Occupational Stress Questionnaire    Feeling of Stress: Only a little  Social Connections: Moderately Integrated (12/29/2023)   Social Connection and Isolation Panel    Frequency of Communication with Friends and Family: Three times a week    Frequency of Social Gatherings with Friends and Family: Once a week    Attends Religious Services: More than 4 times per year    Active Member of [employer] or Organizations: No    Attends Engineer, Structural: Not on file    Marital Status: Married  Depression (PHQ2-9): Low Risk (01/02/2024)   Depression (PHQ2-9)    PHQ-2 Score: 0  Alcohol Screen: Not on file  Housing: Unknown (12/29/2023)   Epic    Unable to Pay for Housing in the Last Year: No    Number of Times Moved in the Last Year: Not on file    Homeless in the Last Year: No  Recent Concern: Housing - High Risk (10/03/2023)   Epic    Unable to Pay for Housing in the Last Year: Yes    Number of Times Moved in the Last Year: 0    Homeless in the Last Year: No  Utilities:  Not At Risk (10/14/2023)   Epic    Threatened with loss of utilities: No  Health Literacy: Not on file    Current Medications: Current Medications[1]  Review of Systems: Denies appetite changes, fevers, chills, fatigue, unexplained weight changes. Denies hearing loss, neck lumps or masses, mouth sores, ringing in ears or voice changes. Denies cough or wheezing.  Denies shortness of breath. Denies chest pain or palpitations. Denies leg swelling. Denies abdominal distention, pain, blood in stools, constipation, diarrhea, nausea, vomiting, or early satiety. Denies pain with intercourse, dysuria, frequency, hematuria or incontinence. Denies hot flashes, pelvic pain, vaginal bleeding or vaginal discharge.   Denies joint pain, back pain or muscle pain/cramps. Denies itching, rash, or wounds. Denies dizziness, headaches, numbness or seizures. Denies swollen lymph nodes or glands, denies easy bruising or bleeding. Denies anxiety, depression, confusion, or decreased concentration.  Physical Exam: BP 136/86   Pulse 87   Temp 98.2 F (36.8 C) (Oral)   Resp 17   Ht 5' 2 (1.575 m)   Wt 174 lb (78.9 kg)   SpO2 99%   BMI 31.83 kg/m  General: Alert,  oriented, no acute distress. HEENT: Posterior oropharynx clear, sclera anicteric. Chest: Clear to auscultation bilaterally.  No wheezes or rhonchi.  Port site clean. Cardiovascular: Regular rate and rhythm, no murmurs. Abdomen: soft, nontender.  Normoactive bowel sounds.  No masses or hepatosplenomegaly appreciated.   Extremities: Grossly normal range of motion.  Warm, well perfused.  No edema bilaterally. Skin: No rashes or lesions noted.  Laboratory & Radiologic Studies:    Latest Ref Rng & Units 01/05/2024   10:30 AM 12/18/2023    9:38 AM 11/27/2023    7:36 AM  CBC  WBC 4.0 - 10.5 K/uL 5.1  8.1  11.5   Hemoglobin 12.0 - 15.0 g/dL 88.8  89.0  88.7   Hematocrit 36.0 - 46.0 % 34.2  32.9  33.7   Platelets 150 - 400 K/uL 138  149  170        Latest Ref Rng & Units 01/05/2024   10:30 AM 12/18/2023    9:38 AM 11/27/2023    7:36 AM  BMP  Glucose 70 - 99 mg/dL 888  812  669   BUN 6 - 20 mg/dL 18  18  16    Creatinine 0.44 - 1.00 mg/dL 9.39  9.41  9.42   Sodium 135 - 145 mmol/L 140  138  138   Potassium 3.5 - 5.1 mmol/L 4.1  4.4  4.2   Chloride 98 - 111 mmol/L 103  100  103   CO2 22 - 32 mmol/L 26  24  25    Calcium 8.9 - 10.3 mg/dL 9.8  9.6  9.2           Component Ref Range & Units (hover) 1 mo ago (12/11/23) 2 mo ago (11/06/23) 2 mo ago (10/23/23) 3 mo ago (10/14/23) 3 mo ago (10/03/23)  Cancer Antigen (CA) 125 36.7 185.0 High  CM 163.0 High  CM 164.0 High  CM 305.0 High  CM     Assessment & Plan: Miranda Hill is a 58 y.o. woman with Stage IIIC HGS ovarian cancer.. Now s/p 4C of NACT. Germline testing negative. CARIS: BRCA2 mutation, ER/PR +, HRD neg, PDL1 CPS 1%, P53 mutation, HER2 neg (0)  Plan for interval debulking surgery. Diagnostic laparoscopy to start, likely open debulking.   Comer Dollar, MD  Division of Gynecologic Oncology  Department of Obstetrics and Gynecology  [facility]      [1]  Current Facility-Administered Medications:    cefOXitin  (MEFOXIN ) 2 g in sodium chloride  0.9 % 100 mL IVPB, 2 g, Intravenous, On Call to OR, Cross, Melissa D, NP   dexamethasone  (DECADRON ) injection 4 mg, 4 mg, Intravenous, On Call to OR, Cross, Melissa D, NP   fentaNYL  (SUBLIMAZE ) injection 50-100 mcg, 50-100 mcg, Intravenous, UD, Keneth Lynwood POUR, MD   insulin  aspart (novoLOG ) injection 0-14 Units, 0-14 Units, Subcutaneous, Q2H PRN, Keneth Lynwood POUR, MD   lactated ringers  infusion, , Intravenous, Continuous, Mallory Manus, MD, Last Rate: 10 mL/hr at 01/14/24 9077, Continued from Pre-op at 01/14/24 9077   midazolam  PF (VERSED ) injection 1-2 mg, 1-2 mg, Intravenous, UD, Keneth Lynwood POUR, MD, 2 mg at 01/14/24 9040   povidone-iodine  10 % swab 2 Application, 2 Application, Topical, Once,  Cross, Melissa D, NP   scopolamine  (TRANSDERM-SCOP) 1 MG/3DAYS 1 mg, 1 patch, Transdermal, On Call to OR, Cross, Melissa D, NP, 1 mg at 01/14/24 9078  Facility-Administered Medications Ordered in Other Encounters:    bupivacaine  liposome (EXPAREL ) 1.3 % injection, , Infiltration, Anesthesia Intra-op,  Keneth Lynwood POUR, MD, 10 mL at 01/14/24 9040   bupivacaine (PF) (MARCAINE ) 0.5 % injection, , Infiltration, Anesthesia Intra-op, Keneth Lynwood POUR, MD, 10 mL at 01/14/24 [phone]

## 2024-01-14 NOTE — Discharge Instructions (Addendum)
 POST OPERATIVE INSTRUCTIONS  General Instructions Recovery (not bed rest) will last approximately 6 weeks Walking is encouraged, but refrain from strenuous exercise/ housework/ heavy lifting. No lifting >10lbs  Nothing in the vagina- NO intercourse, tampons or douching Bathing:  Do not submerge in water  (NO swimming, bath, hot tub, etc) until after your postop visit. You can shower starting the day after surgery.  No driving until you are not taking narcotic pain medicine and until your pain is well enough controlled that you can slam on the breaks or make sudden movements if needed.   Taking your medications Please take your acetaminophen  and ibuprofen on a schedule for the first 48 hours. Take 600mg  ibuprofen, then take 500mg  acetaminophen  3 hours later, then continue to alternate ibuprofen and acetaminophen . That way you are taking each type of medication every 6 hours. Take the prescribed narcotic (oxycodone , tramadol, etc) as needed, with a maximum being every 4 hours.  Take a stool softener daily to keep your stools soft and preventing you from straining. If you have diarrhea, you decrease your stool softener. This is explained more below. We have prescribed you Miralax .  Reasons to Call the Nurse (see last page for phone numbers) Heavy Bleeding (changing your pad every 1-2 hours) Persistent nausea/vomiting Fever (100.4 degrees or more) Incision problems (pus or other fluid coming out, redness, warmth, increased pain)  Things to Expect After Surgery Mild to Moderate pain is normal during the first day or two after surgery. If prescribed, take Ibuprofen or Tylenol  first and use the stronger medicine for break-through pain. You can overlap these medicines because they work differently.   Constipation   To Prevent Constipation:  Eat a well-balanced diet including protein, grains, fresh fruit and vegetables.  Drink plenty of fluids. Walk regularly.  Depending on specific instructions  from your physician: take Miralax  daily and additionally you can add a stool softener (colace/ docusate) and fiber supplement. Continue as long as you're on pain medications.   To Treat Constipation:  If you do not have a bowel movement in 2 days after surgery, you can take 2 Tbs of Milk of Magnesia 1-2 times a day until you have a bowel movement. If diarrhea occurs, decrease the amount or stop the laxative. If no results with Milk of Magnesia, you can drink a bottle of magnesium citrate which you can purchase over the counter.  Fatigue:  This is a normal response to surgery and will improve with time.  Plan frequent rest periods throughout the day.  Gas Pain:  This is very common but can also be very painful! Drink warm liquids such as herbal teas, bouillon or soup. Walking will help you pass more gas.  Mylicon or Gas-X can be taken over the counter.  Leaking Urine:  Varying amounts of leakage may occur after surgery.  This should improve with time. Your bladder needs at least 3 months to recover from surgery. If you leak after surgery, be sure to mention this to your doctor at your post-op visit. If you were taking medications for overactive bladder prior to surgery, be sure to restart the medications immediately after surgery.  Incisions: If you have incisions on your abdomen, the skin glue will dissolve on its own over time. It is ok to gently rinse with soap and water  over these incisions but do not scrub.  Catheter Approximately 50% of patients are unable to urinate after surgery and need to go home with a catheter. This allows your bladder to  rest so it can return to full function. If you go home with a catheter, the office will call to set up a voiding trial a few days after surgery. For most patients, by this visit, they are able to urinate on their own. Long term catheter use is rare.   Return to Work  As work demands and recovery times vary widely, it is hard to predict when you will want  to return to work. If you have a desk job with no strenuous physical activity, and if you would like to return sooner than generally recommended, discuss this with your provider or call our office.   Post op concerns  For non-emergent issues, please call the Urogynecology Nurse. Please leave a message and someone will contact you within one business day.  You can also send a message through MyChart.   AFTER HOURS (After 5:00 PM and on weekends):  For urgent matters that cannot wait until the next business day. Call our office [phone] and connect to the doctor on call.  Please reserve this for important issues.   **FOR ANY TRUE EMERGENCY ISSUES CALL 911 OR GO TO THE NEAREST EMERGENCY ROOM.** Please inform our office or the doctor on call of any emergency.     APPOINTMENTS: Call [phone]    01/21/2024 - GYN Oncology discharge information  Activity: 1. Be up and out of the bed during the day.  Take a nap if needed.  You may walk up steps but be careful and use the hand rail.  Stair climbing will tire you more than you think, you may need to stop part way and rest.   2. No lifting or straining for 6 weeks.  3. No driving if you are taking narcotic pain medicine and until you can slam on the brakes.  4. Shower daily.  Use soap and water  on your incision and pat dry; don't rub.   5. No sexual activity and nothing in the vagina for 12 weeks.  Medications:  - Take ibuprofen and tylenol  first line for pain control. Take these regularly (every 6 hours) to decrease the build up of pain.  - If necessary, for severe pain not relieved by ibuprofen, take narcotic.  - While taking narcotic you should take sennakot every night to reduce the likelihood of constipation. If this causes diarrhea, stop its use.  Diet: 1. Low sodium Heart Healthy Diet is recommended.  2. It is safe to use a laxative (like Miralax ) if you have difficulty moving your bowels.   Wound Care: 1. Keep clean and  dry.  Shower daily.  Reasons to call the Doctor:  Fever - Oral temperature greater than 100.4 degrees Fahrenheit Foul-smelling vaginal discharge Difficulty urinating Nausea and vomiting Increased pain at the site of the incision that is unrelieved with pain medicine. Difficulty breathing with or without chest pain New calf pain especially if only on one side Sudden, continuing increased vaginal bleeding with or without clots.   Follow-up: 1. See Comer Dollar on 1/9  Contacts: For questions or concerns you should contact:  Dr. Comer Dollar at [phone] After hours and on week-ends call [phone] and ask to speak to the physician on call for Gynecologic Oncology

## 2024-01-15 ENCOUNTER — Encounter (HOSPITAL_COMMUNITY): Payer: Self-pay | Admitting: Gynecologic Oncology

## 2024-01-15 LAB — BASIC METABOLIC PANEL WITH GFR
Anion gap: 12 (ref 5–15)
BUN: 9 mg/dL (ref 6–20)
CO2: 23 mmol/L (ref 22–32)
Calcium: 8.1 mg/dL — ABNORMAL LOW (ref 8.9–10.3)
Chloride: 101 mmol/L (ref 98–111)
Creatinine, Ser: 0.7 mg/dL (ref 0.44–1.00)
GFR, Estimated: >60 mL/min (ref >60)
Glucose, Bld: 259 mg/dL — ABNORMAL HIGH (ref 70–99)
Potassium: 4.8 mmol/L (ref 3.5–5.1)
Sodium: 136 mmol/L (ref 135–145)

## 2024-01-15 LAB — CBC
HCT: 35.2 % — ABNORMAL LOW (ref 36.0–46.0)
Hemoglobin: 11.6 g/dL — ABNORMAL LOW (ref 12.0–15.0)
MCH: 29.9 pg (ref 26.0–34.0)
MCHC: 33 g/dL (ref 30.0–36.0)
MCV: 90.7 fL (ref 80.0–100.0)
Platelets: 134 K/uL — ABNORMAL LOW (ref 150–400)
RBC: 3.88 MIL/uL (ref 3.87–5.11)
RDW: 17.5 % — ABNORMAL HIGH (ref 11.5–15.5)
WBC: 18.6 K/uL — ABNORMAL HIGH (ref 4.0–10.5)
nRBC: 0 % (ref 0.0–0.2)

## 2024-01-15 MED ORDER — PROCHLORPERAZINE EDISYLATE 10 MG/2ML IJ SOLN
5.0000 mg | INTRAMUSCULAR | Status: DC | PRN
  Administered 2024-01-15 – 2024-01-17 (×2): 5 mg via INTRAVENOUS
  Filled 2024-01-15 (×2): qty 2

## 2024-01-15 MED ORDER — HYDROMORPHONE HCL 2 MG PO TABS
2.0000 mg | ORAL_TABLET | ORAL | Status: DC | PRN
  Administered 2024-01-15: 21:00:00 2 mg
  Filled 2024-01-15: qty 1

## 2024-01-15 MED ORDER — HYDROMORPHONE HCL 2 MG PO TABS
2.0000 mg | ORAL_TABLET | ORAL | Status: DC | PRN
  Administered 2024-01-16 – 2024-01-21 (×6): 2 mg via ORAL
  Filled 2024-01-15 (×6): qty 1

## 2024-01-15 MED ORDER — ENOXAPARIN SODIUM 40 MG/0.4ML IJ SOSY
40.0000 mg | PREFILLED_SYRINGE | INTRAMUSCULAR | Status: DC
  Administered 2024-01-15 – 2024-01-21 (×7): 40 mg via SUBCUTANEOUS
  Filled 2024-01-15 (×7): qty 0.4

## 2024-01-15 MED ORDER — CARMEX CLASSIC LIP BALM EX OINT
TOPICAL_OINTMENT | CUTANEOUS | Status: DC | PRN
  Filled 2024-01-15: qty 10

## 2024-01-15 MED ORDER — PHENOL 1.4 % MT LIQD
1.0000 | OROMUCOSAL | Status: DC | PRN
  Administered 2024-01-15: 08:00:00 1 via OROMUCOSAL
  Filled 2024-01-15: qty 177

## 2024-01-15 NOTE — Progress Notes (Signed)
°   01/15/24 1032  TOC Brief Assessment  Insurance and Status Reviewed  Patient has primary care physician Yes  Home environment has been reviewed home with spouse  Prior level of function: independent  Prior/Current Home Services No current home services  Social Drivers of Health Review SDOH reviewed no interventions necessary  Readmission risk has been reviewed Yes  Transition of care needs no transition of care needs at this time

## 2024-01-15 NOTE — Progress Notes (Signed)
 1 Day Post-Op Procedures (LRB): LAPAROSCOPY, DIAGNOSTIC (N/A) HYSTERECTOMY, TOTAL, ABDOMINAL, WITH BILATERAL SALPINGO-OOPHORECTOMY, WITH NEOPLASM DEBULKING, APPENDECTOMY, OMENECTOMY (N/A) SUSPENSION, VAGINAL VAULT (N/A) ANTERIOR (CYSTOCELE) AND POSTERIOR REPAIR (RECTOCELE) (N/A) PERINEOPLASTY (N/A) CREATION, PUBOVAGINAL SLING (N/A) CYSTOSCOPY (N/A)  Subjective: Patient reports mild nausea and sore throat from NG tube in place. Pain controlled with prn medications. Had SCDs on overnight. No flatus. Belching intermittently. Denies chest pain, dyspnea. She reports feeling anxious intermittently prior to surgery as well and feels this may be causing increase in HR.  Objective: Vital signs in last 24 hours: Temp:  [97.6 F (36.4 C)-98.8 F (37.1 C)] 98.7 F (37.1 C) (12/18 0700) Pulse Rate:  [100-122] 120 (12/18 0700) Resp:  [11-17] 16 (12/18 0700) BP: (115-140)/(79-89) 137/89 (12/18 0700) SpO2:  [94 %-100 %] 96 % (12/18 0700) Last BM Date : 01/13/24  Intake/Output from previous day: 12/17 0701 - 12/18 0700 In: 3605.1 [I.V.:3055.1; IV Piggyback:550] Out: 2120 [Urine:1600; Emesis/NG output:50; Drains:145; Blood:325]  Physical Examination: General: cooperative, no distress, and sleeping, awakening to verbal stimuli Resp: clear to auscultation bilaterally Cardio: regular in rhythm, tachycardic GI: incision: midline abdominal incision with op site dressing in place with no drainage noted underneath and abdomen is soft, appropriately tender, faint BS, JP drain coming from left upper quadrant with sanguinous drainage, stripped by Dr. Viktoria Extremities: extremities normal, atraumatic, no cyanosis or edema SCDs powered on. Foley in place with orange urine from recent pyridium  use.  NG tube to intermittent wall suction with around 150 cc output.  Labs: WBC/Hgb/Hct/Plts:  18.6/11.6/35.2/134 (12/18 9491) BUN/Cr/glu/ALT/AST/amyl/lip:  9/0.70/--/--/--/--/-- (12/18 9491)  Assessment: 58 y.o.  s/p Procedures: LAPAROSCOPY, DIAGNOSTIC HYSTERECTOMY, TOTAL, ABDOMINAL, WITH BILATERAL SALPINGO-OOPHORECTOMY, WITH NEOPLASM DEBULKING, APPENDECTOMY, OMENECTOMY SUSPENSION, VAGINAL VAULT ANTERIOR (CYSTOCELE) AND POSTERIOR REPAIR (RECTOCELE) PERINEOPLASTY CREATION, PUBOVAGINAL SLING CYSTOSCOPY: stable Pain:  Pain is well-controlled on PRN medications.  Heme: Hgb 11.6 and Hct 35.2 this am. Continue to monitor.   ID: WBC 18.6 this am. Given decadron  and intra-op abxs. No sign of infection at this time.  CV: BP stable. Tachycardic. Will continue to monitor. Continue with IVF to rehydrate.   GI:  Tolerating po: No, NPO with NG tube in place.   GU: Foley in place. Adequate urine output overnight. Voiding trial ordered for this am per Dr. Guadlupe.    FEN: No critical values on am labs.  Prophylaxis: SCDs and lovenox   Plan: NG to be removed at 10:30 am today. Can start clear liquids when NG tube out. Start oral medications when NG out. Voiding trial per Dr. Guadlupe.  Increase mobility with assist.  IS encouraged.  AM labs. Continue with current plan of care.    LOS: 1 day    Miranda Hill 01/15/2024, 9:39 AM

## 2024-01-15 NOTE — Progress Notes (Signed)
 [facility]  Date of Visit: 12/02/2023  History of Present Illness: Ms. Nelson is a 58 y.o. female POD#1   Surgery: s/p Exam under anesthesia, abdominal uterosacral ligament vaginal vault suspension, posterior repair, perineoplasty, and cystoscopy on 01/14/24  She is pending her postoperative void trial this afternoon.    Today she reports nausea with NG tube in place. 3/10 abdominal pain.  Denies CP, SOB, vomiting, or bowel movement Minimal spotting, foley in place without bother.  Pathology results: pending  Medications:   Current Facility-Administered Medications (Analgesics):    acetaminophen  (OFIRMEV ) IV 1,000 mg   HYDROmorphone  (DILAUDID ) injection 0.5-1 mg  Current Facility-Administered Medications (Hematological):    enoxaparin  (LOVENOX ) injection 40 mg  Current Facility-Administered Medications (Other):    dextrose  5 % and 0.45 % NaCl with KCl 20 mEq/L infusion   lip balm (CARMEX) ointment   ondansetron  (ZOFRAN ) tablet 4 mg **OR** ondansetron  (ZOFRAN ) injection 4 mg   pantoprazole  (PROTONIX ) injection 40 mg   phenol (CHLORASEPTIC) mouth spray 1 spray  No current outpatient medications on file.   Allergies: Patient is allergic to tree extract, codeine, latex, oxycodone , tramadol, and paclitaxel .   Physical Exam: BP 139/79 (BP Location: Left Arm)   Pulse (!) 122   Temp 98.4 F (36.9 C)   Resp 17   Ht 5' 2 (1.575 m)   Wt 78.9 kg   SpO2 96%   BMI 31.83 kg/m   Abdomen: soft, nondistended, tenderness mild in the LUQ and in the LLQ, and without guarding Drain in place.  Abdominal Incisions: healing well, no significant drainage, no dehiscence, honeycomb dressing in place with Tegaderm.  Pelvic Examination: deferred,  minimal blood on pad with foley in place draining orange tinged urine   Lab Results  Component Value Date   WBC 18.6 (H) 01/15/2024   HGB 11.6 (L) 01/15/2024   HCT 35.2 (L) 01/15/2024   MCV 90.7 01/15/2024   PLT 134 (L) 01/15/2024    Lab Results  Component Value Date   CREATININE 0.70 01/15/2024    Intake/Output Summary (Last 24 hours) at 01/15/2024 0859 Last data filed at 01/15/2024 0846 Gross per 24 hour  Intake 3605.05 ml  Output 2395 ml  Net 1210.05 ml    ---------------------------------------------------------  Assessment and Plan:  1. Malignant neoplasm of ovary, unspecified laterality (HCC)   2. Preop testing    - Gen - pain 3/10 well controlled, transition to PO meds with PO challenge later today. - Cardio/pulm - tacyhcardic, denies CP/SOB, continue to monitor - GI - nausea with scopolamine  in place. Encouraged ambulation and oral challenge. Pending NG removal if tolerating PO - GU - UOP WNL. Pending void trial this afternoon with minimal bleeding. Continue to monitor for signs and symptoms of urinary retention. Cr 0.7 WNL - VTE - encouraged ambulation, lovenox  - Dispo - pending gyn onc recommendations.   - Pathology results  pending  - Discussed avoidance of heavy lifting and straining long term to reduce the risk of recurrence.   All questions answered.

## 2024-01-15 NOTE — Plan of Care (Signed)
  Problem: Education: Goal: Knowledge of General Education information will improve Description: Including pain rating scale, medication(s)/side effects and non-pharmacologic comfort measures Outcome: Progressing   Problem: Pain Managment: Goal: General experience of comfort will improve and/or be controlled Outcome: Progressing   Problem: Activity: Goal: Risk for activity intolerance will decrease Outcome: Progressing

## 2024-01-16 ENCOUNTER — Inpatient Hospital Stay (HOSPITAL_COMMUNITY)

## 2024-01-16 DIAGNOSIS — R000 Tachycardia, unspecified: Secondary | ICD-10-CM | POA: Diagnosis not present

## 2024-01-16 DIAGNOSIS — J9811 Atelectasis: Secondary | ICD-10-CM | POA: Diagnosis not present

## 2024-01-16 LAB — BASIC METABOLIC PANEL WITH GFR
Anion gap: 9 (ref 5–15)
BUN: 7 mg/dL (ref 6–20)
CO2: 24 mmol/L (ref 22–32)
Calcium: 8.2 mg/dL — ABNORMAL LOW (ref 8.9–10.3)
Chloride: 104 mmol/L (ref 98–111)
Creatinine, Ser: 0.57 mg/dL (ref 0.44–1.00)
GFR, Estimated: >60 mL/min
Glucose, Bld: 171 mg/dL — ABNORMAL HIGH (ref 70–99)
Potassium: 4.1 mmol/L (ref 3.5–5.1)
Sodium: 136 mmol/L (ref 135–145)

## 2024-01-16 LAB — CBC
HCT: 30.3 % — ABNORMAL LOW (ref 36.0–46.0)
Hemoglobin: 9.8 g/dL — ABNORMAL LOW (ref 12.0–15.0)
MCH: 30.1 pg (ref 26.0–34.0)
MCHC: 32.3 g/dL (ref 30.0–36.0)
MCV: 92.9 fL (ref 80.0–100.0)
Platelets: 111 K/uL — ABNORMAL LOW (ref 150–400)
RBC: 3.26 MIL/uL — ABNORMAL LOW (ref 3.87–5.11)
RDW: 18 % — ABNORMAL HIGH (ref 11.5–15.5)
WBC: 16 K/uL — ABNORMAL HIGH (ref 4.0–10.5)
nRBC: 0 % (ref 0.0–0.2)

## 2024-01-16 LAB — SURGICAL PATHOLOGY

## 2024-01-16 LAB — LACTIC ACID, PLASMA: Lactic Acid, Venous: 1.8 mmol/L (ref 0.5–1.9)

## 2024-01-16 MED ORDER — IOHEXOL 350 MG/ML SOLN
75.0000 mL | Freq: Once | INTRAVENOUS | Status: AC | PRN
  Administered 2024-01-16: 75 mL via INTRAVENOUS

## 2024-01-16 MED ORDER — PANTOPRAZOLE SODIUM 40 MG PO TBEC
40.0000 mg | DELAYED_RELEASE_TABLET | Freq: Every day | ORAL | Status: DC
  Administered 2024-01-16 – 2024-01-22 (×7): 40 mg via ORAL
  Filled 2024-01-16 (×7): qty 1

## 2024-01-16 MED ORDER — POLYETHYLENE GLYCOL 3350 17 G PO PACK
17.0000 g | PACK | Freq: Every day | ORAL | Status: DC | PRN
  Filled 2024-01-16: qty 1

## 2024-01-16 MED ORDER — ALPRAZOLAM 0.25 MG PO TABS: 0.2500 mg | ORAL_TABLET | Freq: Two times a day (BID) | ORAL | Status: DC | PRN

## 2024-01-16 NOTE — Progress Notes (Signed)
 [facility]  Date of Visit: 12/02/2023  History of Present Illness: Ms. Miranda Hill is a 58 y.o. female POD#2  Surgery: s/p Exam under anesthesia, abdominal uterosacral ligament vaginal vault suspension, posterior repair, perineoplasty, and cystoscopy on 01/14/24  She is pending her postoperative void trial this afternoon.    Today she reports intermittent nausea with NG tube removed yesterday, tolerating liquids without vomiting.  Reports 5/10 abdominal pain.  Denies CP, SOB, vomiting, passing flatus or bowel movement Ambulating with minimal assistance Minimal spotting Reports position changes to void completed. Denies UTI symptoms of hematuria, dysuria   Pathology results: pending  Medications:   Current Facility-Administered Medications (Analgesics):    HYDROmorphone  (DILAUDID ) injection 0.5-1 mg   HYDROmorphone  (DILAUDID ) tablet 2-4 mg  Current Facility-Administered Medications (Hematological):    enoxaparin  (LOVENOX ) injection 40 mg  Current Facility-Administered Medications (Other):    lip balm (CARMEX) ointment   ondansetron  (ZOFRAN ) tablet 4 mg **OR** ondansetron  (ZOFRAN ) injection 4 mg   pantoprazole  (PROTONIX ) EC tablet 40 mg   phenol (CHLORASEPTIC) mouth spray 1 spray   prochlorperazine  (COMPAZINE ) injection 5 mg  No current outpatient medications on file.   Allergies: Patient is allergic to tree extract, codeine, latex, oxycodone , tramadol, and paclitaxel .   Physical Exam: BP (!) 141/78 (BP Location: Left Arm)   Pulse (!) 126   Temp 98.1 F (36.7 C) (Oral)   Resp 18   Ht 5' 2 (1.575 m)   Wt 78.9 kg   SpO2 97%   BMI 31.83 kg/m   Abdomen: soft, nondistended, tenderness mild in the LUQ and in the LLQ, and without guarding Drain in place.  Abdominal Incisions: healing well, no significant drainage, no dehiscence, honeycomb dressing in place with Tegaderm.  Pelvic Examination: deferred,  minimal blood on pad  Lab Results  Component Value Date    WBC 16.0 (H) 01/16/2024   HGB 9.8 (L) 01/16/2024   HCT 30.3 (L) 01/16/2024   MCV 92.9 01/16/2024   PLT 111 (L) 01/16/2024   Lab Results  Component Value Date   CREATININE 0.57 01/16/2024    Intake/Output Summary (Last 24 hours) at 01/16/2024 0945 Last data filed at 01/16/2024 0935 Gross per 24 hour  Intake 2199.79 ml  Output 2640 ml  Net -440.21 ml    ---------------------------------------------------------  Assessment and Plan:  1. Malignant neoplasm of ovary, unspecified laterality (HCC)   2. Preop testing   3. Mixed stress and urge urinary incontinence   4. Postoperative nausea   5. Gastroesophageal reflux disease without esophagitis    - Gen - pain 5/10 well controlled on PO meds. - Cardio/pulm - tacyhcardic, denies CP/SOB, continue to monitor and considering CTA.  - GI - nausea with scopolamine  in place with history of PONV. Encouraged to continue ambulation and oral challenge. Pending NG removal if tolerating PO - GU - UOP WNL. Pass void trial on POD#1. H/o rectus fascial sling, prior sensation of incomplete emptying and leakage resolved with Trospium  preop, can resume after return of GI function. Continue to monitor for signs and symptoms of urinary retention, postvoid PVR ordered today. Cr 0.7 WNL - VTE - encouraged ambulation, lovenox  - Dispo - pending gyn onc recommendations.  - Pathology results  pending  - Discussed avoidance of heavy lifting and straining long term to reduce the risk of recurrence.   All questions answered.

## 2024-01-16 NOTE — Anesthesia Postprocedure Evaluation (Signed)
"   Anesthesia Post Note  Patient: Miranda Hill  Procedure(s) Performed: LAPAROSCOPY, DIAGNOSTIC HYSTERECTOMY, TOTAL, ABDOMINAL, WITH BILATERAL SALPINGO-OOPHORECTOMY, WITH NEOPLASM DEBULKING, APPENDECTOMY, OMENECTOMY SUSPENSION, VAGINAL VAULT ANTERIOR (CYSTOCELE) AND POSTERIOR REPAIR (RECTOCELE) PERINEOPLASTY CREATION, PUBOVAGINAL SLING CYSTOSCOPY     Patient location during evaluation: PACU Anesthesia Type: General Level of consciousness: awake and alert Pain management: pain level controlled Vital Signs Assessment: post-procedure vital signs reviewed and stable Respiratory status: spontaneous breathing, nonlabored ventilation, respiratory function stable and patient connected to nasal cannula oxygen Cardiovascular status: blood pressure returned to baseline and stable Postop Assessment: no apparent nausea or vomiting Anesthetic complications: no   No notable events documented.  Last Vitals:  Vitals:   01/16/24 0304 01/16/24 0613  BP: 136/76 (!) 141/78  Pulse: (!) 124 (!) 126  Resp: 19 18  Temp: 36.8 C 36.7 C  SpO2: 93% 97%    Last Pain:  Vitals:   01/16/24 0627  TempSrc:   PainSc: 5                  Lynwood MARLA Cornea      "

## 2024-01-16 NOTE — Progress Notes (Signed)
 2 Days Post-Op Procedures (LRB): LAPAROSCOPY, DIAGNOSTIC (N/A) HYSTERECTOMY, TOTAL, ABDOMINAL, WITH BILATERAL SALPINGO-OOPHORECTOMY, WITH NEOPLASM DEBULKING, APPENDECTOMY, OMENECTOMY (N/A) SUSPENSION, VAGINAL VAULT (N/A) ANTERIOR (CYSTOCELE) AND POSTERIOR REPAIR (RECTOCELE) (N/A) PERINEOPLASTY (N/A) CREATION, PUBOVAGINAL SLING (N/A) CYSTOSCOPY (N/A)  Subjective: Patient reports feels anxious/stressed about family issues. Denies chest pain, dyspnea, SOB on exertion. Feels heart beating fast. Nausea has improved. Tolerating clear liquids. Wanting to try some eggs this am. Voiding without difficulty. No flatus or BM reported. Pain controlled with oral medications.   Objective: Vital signs in last 24 hours: Temp:  [97.9 F (36.6 C)-98.5 F (36.9 C)] 98.1 F (36.7 C) (12/19 9386) Pulse Rate:  [104-126] 126 (12/19 0613) Resp:  [17-19] 18 (12/19 9386) BP: (118-151)/(67-88) 141/78 (12/19 0613) SpO2:  [93 %-97 %] 97 % (12/19 0613) Last BM Date : 01/13/24  Intake/Output from previous day: 12/18 0701 - 12/19 0700 In: 2199.8 [P.O.:660; I.V.:1089.8; IV Piggyback:200] Out: 2715 [Urine:2525; Emesis/NG output:50; Drains:140]  Physical Examination: General: alert, cooperative, and no distress Resp: clear to auscultation bilaterally Cardio: regular in rhythm, tachycardic.  GI: incision: abdomen soft, active bowel sounds, mildly tympanic, binder in place, JP drain with serous output and abdominal incisions are intact with op site dressing over midline with no active drainage noted underneath Extremities: extremities normal, atraumatic, no cyanosis or edema  Labs: WBC/Hgb/Hct/Plts:  16.0/9.8/30.3/111 (12/19 9486) BUN/Cr/glu/ALT/AST/amyl/lip:  7/0.57/--/--/--/--/-- (12/19 9486)  Assessment: 58 y.o. s/p Procedures: LAPAROSCOPY, DIAGNOSTIC HYSTERECTOMY, TOTAL, ABDOMINAL, WITH BILATERAL SALPINGO-OOPHORECTOMY, WITH NEOPLASM DEBULKING, APPENDECTOMY, OMENECTOMY SUSPENSION, VAGINAL VAULT ANTERIOR  (CYSTOCELE) AND POSTERIOR REPAIR (RECTOCELE) PERINEOPLASTY CREATION, PUBOVAGINAL SLING CYSTOSCOPY: stable Lactic acid 1.8  Pain:  Pain is well-controlled on PRN medications.  Heme: Hgb 9.8 from 11.6 and Hct 30.3 from 35.2 this am. Continue to monitor.   ID: WBC 16.0 from 18.6. Given decadron  and intra-op abxs. No sign of infection at this time.  CV: BP stable. Tachycardic-pt reports related to anxiety, reports feeling weird when taking zoloft in the past. Will continue to monitor.   GI:  Tolerating po Yes, clears. Diet advanced this am to solid food by Dr. Viktoria.    GU: Continue monitoring output. Voiding since foley removal.   FEN: No critical values on am labs.  Prophylaxis: SCDs and lovenox   Plan: Diet advanced this am Increase mobility with assist IS encouraged AM labs Monitor HR. May need chest imaging to rule out PE if persists Xanax ordered prn Continue with current plan of care   LOS: 2 days    Miranda Hill 01/16/2024, 9:22 AM

## 2024-01-16 NOTE — Progress Notes (Signed)
" °   01/16/24 2203  BiPAP/CPAP/SIPAP  $ Non-Invasive Home Ventilator  Initial  BiPAP/CPAP/SIPAP Pt Type Adult  BiPAP/CPAP/SIPAP Resmed  Mask Type Nasal mask (from home)  Dentures removed? Not applicable  FiO2 (%) 21 %  Patient Home Machine No  Patient Home Mask Yes  Patient Home Tubing Yes  Auto Titrate Yes  Minimum cmH2O 4 cmH2O  Maximum cmH2O 12 cmH2O  Device Plugged into RED Power Outlet Yes  BiPAP/CPAP /SiPAP Vitals  Pulse Rate (!) 119  Resp 16  SpO2 95 %  Bilateral Breath Sounds Clear;Diminished  MEWS Score/Color  MEWS Score 2  MEWS Score Color Yellow    "

## 2024-01-17 LAB — CBC
HCT: 25.2 % — ABNORMAL LOW (ref 36.0–46.0)
HCT: 32.3 % — ABNORMAL LOW (ref 36.0–46.0)
Hemoglobin: 8.1 g/dL — ABNORMAL LOW (ref 12.0–15.0)
Hemoglobin: 10.5 g/dL — ABNORMAL LOW (ref 12.0–15.0)
MCH: 30.2 pg (ref 26.0–34.0)
MCH: 29.9 pg (ref 26.0–34.0)
MCHC: 32.1 g/dL (ref 30.0–36.0)
MCHC: 32.5 g/dL (ref 30.0–36.0)
MCV: 94 fL (ref 80.0–100.0)
MCV: 92 fL (ref 80.0–100.0)
Platelets: 110 K/uL — ABNORMAL LOW (ref 150–400)
Platelets: 160 K/uL (ref 150–400)
RBC: 2.68 MIL/uL — ABNORMAL LOW (ref 3.87–5.11)
RBC: 3.51 MIL/uL — ABNORMAL LOW (ref 3.87–5.11)
RDW: 17.9 % — ABNORMAL HIGH (ref 11.5–15.5)
RDW: 17.3 % — ABNORMAL HIGH (ref 11.5–15.5)
WBC: 12.4 K/uL — ABNORMAL HIGH (ref 4.0–10.5)
WBC: 18.2 K/uL — ABNORMAL HIGH (ref 4.0–10.5)
nRBC: 0 % (ref 0.0–0.2)
nRBC: 0 % (ref 0.0–0.2)

## 2024-01-17 LAB — BASIC METABOLIC PANEL WITH GFR
Anion gap: 8 (ref 5–15)
BUN: 9 mg/dL (ref 6–20)
CO2: 28 mmol/L (ref 22–32)
Calcium: 8.6 mg/dL — ABNORMAL LOW (ref 8.9–10.3)
Chloride: 103 mmol/L (ref 98–111)
Creatinine, Ser: 0.55 mg/dL (ref 0.44–1.00)
GFR, Estimated: >60 mL/min
Glucose, Bld: 163 mg/dL — ABNORMAL HIGH (ref 70–99)
Potassium: 4 mmol/L (ref 3.5–5.1)
Sodium: 140 mmol/L (ref 135–145)

## 2024-01-17 MED ORDER — ENSURE PLUS HIGH PROTEIN PO LIQD: 237.0000 mL | Freq: Two times a day (BID) | ORAL | Status: DC

## 2024-01-17 MED ORDER — ACETAMINOPHEN 325 MG PO TABS
650.0000 mg | ORAL_TABLET | Freq: Four times a day (QID) | ORAL | Status: DC | PRN
  Administered 2024-01-20: 650 mg via ORAL
  Filled 2024-01-17 (×2): qty 2

## 2024-01-17 MED ORDER — CHLORHEXIDINE GLUCONATE CLOTH 2 % EX PADS
6.0000 | MEDICATED_PAD | Freq: Every day | CUTANEOUS | Status: DC
  Administered 2024-01-18 – 2024-01-22 (×5): 6 via TOPICAL

## 2024-01-17 MED ORDER — ALTEPLASE 2 MG IJ SOLR
2.0000 mg | Freq: Once | INTRAMUSCULAR | Status: AC
  Administered 2024-01-18: 2 mg
  Filled 2024-01-17: qty 2

## 2024-01-17 MED ORDER — DEXTROSE-SODIUM CHLORIDE 5-0.45 % IV SOLN: INTRAVENOUS | Status: AC

## 2024-01-17 MED ORDER — SODIUM CHLORIDE 0.9% FLUSH
10.0000 mL | Freq: Two times a day (BID) | INTRAVENOUS | Status: DC
  Administered 2024-01-17 – 2024-01-21 (×2): 10 mL

## 2024-01-17 MED ORDER — SIMETHICONE 80 MG PO CHEW
80.0000 mg | CHEWABLE_TABLET | Freq: Four times a day (QID) | ORAL | Status: DC | PRN
  Administered 2024-01-17 – 2024-01-19 (×2): 80 mg via ORAL
  Filled 2024-01-17 (×2): qty 1

## 2024-01-17 MED ORDER — SODIUM CHLORIDE 0.9% FLUSH
10.0000 mL | INTRAVENOUS | Status: DC | PRN
  Administered 2024-01-17: 40 mL

## 2024-01-17 MED ORDER — SODIUM CHLORIDE 0.9 % IV BOLUS
500.0000 mL | Freq: Once | INTRAVENOUS | Status: AC
  Administered 2024-01-17: 500 mL via INTRAVENOUS

## 2024-01-17 NOTE — Progress Notes (Signed)
 Miranda Hill  Date of Visit: 12/02/2023  History of Present Illness: Miranda Hill is a 58 y.o. female POD#3  Surgery: s/p Exam under anesthesia, abdominal uterosacral ligament vaginal vault suspension, posterior repair, perineoplasty, and cystoscopy on 01/14/24 - Diagnostic laparoscopy, conversion to exploratory laparotomy with radical tumor debulking including total omentectomy, peritoneal stripping, excision of multiple small bowel mesenteric nodules, argon laser of peritoneal nodules, small bowel mesenteric nodules and rectal nodules; total hysterectomy with bilateral salpingo-oophorectomy; appendectomy by Dr. Viktoria  She passed her postoperative void trial POD#1, PVR on POD#2.    Today she reports intermittent nausea, tolerating liquids without vomiting.  Reports 5/10 abdominal pain.  Denies CP, SOB, vomiting, passing flatus or bowel movement Ambulating with minimal assistance Minimal spotting Reports position changes to void completed. Denies UTI symptoms of hematuria, dysuria or sensation of incomplete emptying with position changes. Attributed HR to anxiety regarding concerns with family issues, CTA negative yesterday. Using IS at bedside Reports using CPAP with discomfort   Pathology results:  A. 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  B. 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  C. 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  D. UTERUS, CERVIX, BILATERAL TUBES AND OVARIES: -  1 unoriented fallopian tube involved by high-grade serous carcinoma measuring 12 mm in greatest linear dimension. -  Unremarkable cervix, negative for dysplasia. -  Atrophic appearing endometrium with adenomyosis. -  Overall unremarkable myometrium. -  Posterior  reflection/serosa with involvement by the patient's known high-grade serous carcinoma. -  See synoptic report below for further information.  E. 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  G. 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  H. APPENDIX: -  Serosal involvement by the patient's known high-grade serous carcinoma  I. 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  Medications:   Current Facility-Administered Medications (Analgesics):    HYDROmorphone  (DILAUDID ) injection 0.5-1 mg   HYDROmorphone  (DILAUDID ) tablet 2-4 mg  Current Facility-Administered Medications (Hematological):    enoxaparin  (LOVENOX ) injection 40 mg  Current Facility-Administered Medications (Other):    ALPRAZolam  (XANAX ) tablet 0.25 mg   lip balm (CARMEX) ointment   ondansetron  (ZOFRAN ) tablet 4 mg **OR** ondansetron  (ZOFRAN ) injection 4 mg   pantoprazole  (PROTONIX ) EC tablet 40 mg   phenol (CHLORASEPTIC) mouth spray 1 spray   polyethylene glycol (MIRALAX  / GLYCOLAX ) packet 17 g   prochlorperazine  (COMPAZINE ) injection 5 mg  No current outpatient medications on file.   Allergies: Patient is allergic to tree extract, codeine, latex, oxycodone , tramadol, and paclitaxel .   Physical Exam: BP 123/76 (BP Location: Left Arm)   Pulse (!) 114   Temp 98.1 F (36.7 C) (Oral)   Resp 18   Ht 5' 2 (1.575 m)   Wt 78.9 kg   SpO2 92%   BMI 31.83 kg/m   Abdomen: soft, nondistended, tenderness mild in the LUQ and in the LLQ, and without guarding Drain in place.  Abdominal Incisions: healing well, no significant drainage, no dehiscence, honeycomb dressing in place with Tegaderm.  Pelvic Examination: deferred,  minimal blood on pad  Lab Results   Component Value Date   WBC 12.4 (H)  01/17/2024   HGB 8.1 (L) 01/17/2024   HCT 25.2 (L) 01/17/2024   MCV 94.0 01/17/2024   PLT 110 (L) 01/17/2024   Lab Results  Component Value Date   CREATININE 0.55 01/17/2024    Intake/Output Summary (Last 24 hours) at 01/17/2024 1050 Last data filed at 01/17/2024 0906 Gross per 24 hour  Intake 1440 ml  Output 1775 ml  Net -335 ml   I/O last 3 completed shifts: In: 1500 [P.O.:1500] Out: 2580 [Urine:2500; Drains:80]  ---------------------------------------------------------  Assessment and Plan:  1. Malignant neoplasm of ovary, unspecified laterality (HCC)   2. Preop testing   3. Mixed stress and urge urinary incontinence   4. Postoperative nausea   5. Gastroesophageal reflux disease without esophagitis   6. Situational anxiety    - Gen - pain 5/10 well controlled on PO dilaudid . Reports history of nausea with tramadol and oxycodone  use. Added tylenol  PRN moderate pain for multimodal pain control due to history of opioid induce nausea.  - Cardio/pulm - tacyhcardic, denies CP/SOB, negative CTA 01/16/24. Continue to monitor and consider additional workup if persistent or clinical change. Hgb trending down from 11 to 8.1, continue to monitor. - GI - nausea with scopolamine  in place with history of PONV. Encouraged to continue ambulation and oral challenge. Husband to bring gum when he returns. NG removed on POD#1 - GU - UOP WNL. Cr WNL at 0.55. Pass void trial on POD#1. H/o rectus fascial sling, prior sensation of incomplete emptying and leakage resolved with Trospium  preop, can resume after return of GI function. Continue to monitor for signs and symptoms of urinary retention, postvoid PVR 108 on POD#2.  - VTE - encouraged ambulation, continue lovenox  - Dispo - pending gyn onc recommendations.  - Pathology results  pending  - Discussed avoidance of heavy lifting and straining long term to reduce the risk of recurrence.   All questions  answered.

## 2024-01-17 NOTE — Progress Notes (Signed)
 Pt port-a-cath accessed without blood return. Alteplase  ordered per protocol. Primary RN to administer fluid bolus and other IVFs due to pt heart rate. Primary RN to re-consult when alteplase  can be instilled.

## 2024-01-17 NOTE — Progress Notes (Addendum)
 3 Days Post-Op Procedures (LRB): LAPAROSCOPY, DIAGNOSTIC (N/A) HYSTERECTOMY, TOTAL, ABDOMINAL, WITH BILATERAL SALPINGO-OOPHORECTOMY, WITH NEOPLASM DEBULKING, APPENDECTOMY, OMENECTOMY (N/A) SUSPENSION, VAGINAL VAULT (N/A) ANTERIOR (CYSTOCELE) AND POSTERIOR REPAIR (RECTOCELE) (N/A) PERINEOPLASTY (N/A) CREATION, PUBOVAGINAL SLING (N/A) CYSTOSCOPY (N/A)  Subjective: -N/V/flatus/BM. +anorexia  Objective: Vital signs in last 24 hours: Temp:  [98.1 F (36.7 C)-99.1 F (37.3 C)] 98.1 F (36.7 C) (12/20 0906) Pulse Rate:  [114-130] 114 (12/20 0906) Resp:  [15-20] 18 (12/20 0906) BP: (123-149)/(60-76) 123/76 (12/20 0906) SpO2:  [90 %-96 %] 92 % (12/20 0906) FiO2 (%):  [21 %] 21 % (12/19 2203) Last BM Date : 01/13/24  Intake/Output from previous day: 12/19 0701 - 12/20 0700 In: 1440 [P.O.:1440] Out: 1670 [Urine:1600; Drains:70]  Physical Examination: General: alert, cooperative, and no distress Resp: clear to auscultation bilaterally Cardio: regular in rhythm, tachycardic.  GI: incision: abdomen soft, active bowel sounds, mildly tympanic, binder in place, JP drain with serous output and abdominal incisions are intact with op site dressing over midline with no active drainage noted underneath Extremities: extremities normal, atraumatic, no cyanosis or edema  Labs: WBC/Hgb/Hct/Plts:  12.4/8.1/25.2/110 (12/20 9491) BUN/Cr/glu/ALT/AST/amyl/lip:  9/0.55/--/--/--/--/-- (12/20 9491) CT Angio Chest Pulmonary Embolism (PE) W or WO Contrast Result Date: 01/16/2024 EXAM: CTA CHEST 01/16/2024 05:57:46 PM TECHNIQUE: CTA of the chest was performed without and with the administration of 75 mL of iohexol  (OMNIPAQUE ) 350 MG/ML injection. Multiplanar reformatted images are provided for review. MIP images are provided for review. Automated exposure control, iterative reconstruction, and/or weight based adjustment of the mA/kV was utilized to reduce the radiation dose to as low as reasonably achievable.  COMPARISON: CT chest abdomen and pelvis 12/05/2023. CLINICAL HISTORY: Pulmonary embolism (PE) suspected, high prob; Per Dr. Viktoria, post-op tachycardia and oxygen desaturation, s/p major surgery for gyn malignancy, rule out PE. FINDINGS: PULMONARY ARTERIES: Pulmonary arteries are adequately opacified for evaluation. No acute pulmonary embolus. Main pulmonary artery is normal in caliber. MEDIASTINUM: Right chest wall catheter tip ends in the SVC. The heart and pericardium demonstrate no acute abnormality. There is no acute abnormality of the thoracic aorta. LYMPH NODES: No mediastinal, hilar or axillary lymphadenopathy. LUNGS AND PLEURA: There are new atelectatic changes in the bilateral lower lobes. The lungs are otherwise clear. No evidence of pleural effusion or pneumothorax. UPPER ABDOMEN: Limited images of the upper abdomen demonstrate a small amount of free air and a small hiatal hernia. SOFT TISSUES AND BONES: No acute bone or soft tissue abnormality. IMPRESSION: 1. No evidence of pulmonary embolism. 2. New atelectatic changes in the bilateral lower lobes. 3. Small volume pneumoperitoneum; likely related to recent surgery. 4. Small hiatal hernia. Electronically signed by: Greig Pique MD 01/16/2024 06:36 PM EST RP Workstation: [id number]    Assessment: 58 y.o. s/p Procedures: LAPAROSCOPY, DIAGNOSTIC HYSTERECTOMY, TOTAL, ABDOMINAL, WITH BILATERAL SALPINGO-OOPHORECTOMY, WITH NEOPLASM DEBULKING, APPENDECTOMY, OMENECTOMY SUSPENSION, VAGINAL VAULT ANTERIOR (CYSTOCELE) AND POSTERIOR REPAIR (RECTOCELE) PERINEOPLASTY CREATION, PUBOVAGINAL SLING CYSTOSCOPY: stable   Pain:  Pain is well-controlled on PRN medications.  Heme: Hgb 8.1. Suspect in part dilutional. Stable low plts. H/O pancytopenia.  ID: Downward-trending WBC.  CV: BP stable. Tachycardic--stable. CT angio neg for PE. Xanax  ordered for anxiety  GI:  Tolerating po Yes.  FEN: No significant metabolic derangements.  Prophylaxis: SCDs and  lovenox .  Low suspicion for HIT.   Plan:  Encouraged mobility with assist Continue IS AM labs Consider transfusion 1 uPRBC Repeat CBC now Continue anxiolytic Add nutritional supplement   LOS: 3 days    Olam Mill 01/17/2024, 10:12 AM

## 2024-01-17 NOTE — Progress Notes (Signed)
" °   01/17/24 2022  BiPAP/CPAP/SIPAP  BiPAP/CPAP/SIPAP Pt Type Adult  BiPAP/CPAP/SIPAP Resmed  Mask Type Nasal mask  Dentures removed? Not applicable  FiO2 (%) 21 %  Patient Home Machine No  Patient Home Mask Yes  Patient Home Tubing Yes  Auto Titrate Yes  Minimum cmH2O 4 cmH2O  Maximum cmH2O 12 cmH2O  CPAP/SIPAP surface wiped down Yes  Device Plugged into RED Power Outlet Yes  BiPAP/CPAP /SiPAP Vitals  Pulse Rate (!) 102  Resp 17  SpO2 95 %  Bilateral Breath Sounds Clear;Diminished  MEWS Score/Color  MEWS Score 1  MEWS Score Color Green    "

## 2024-01-18 ENCOUNTER — Inpatient Hospital Stay (HOSPITAL_COMMUNITY)

## 2024-01-18 DIAGNOSIS — K567 Ileus, unspecified: Secondary | ICD-10-CM | POA: Diagnosis not present

## 2024-01-18 DIAGNOSIS — R140 Abdominal distension (gaseous): Secondary | ICD-10-CM | POA: Diagnosis not present

## 2024-01-18 DIAGNOSIS — Z9889 Other specified postprocedural states: Secondary | ICD-10-CM | POA: Diagnosis not present

## 2024-01-18 DIAGNOSIS — K6389 Other specified diseases of intestine: Secondary | ICD-10-CM | POA: Diagnosis not present

## 2024-01-18 LAB — CBC
HCT: 27.5 % — ABNORMAL LOW (ref 36.0–46.0)
Hemoglobin: 8.8 g/dL — ABNORMAL LOW (ref 12.0–15.0)
MCH: 30.2 pg (ref 26.0–34.0)
MCHC: 32 g/dL (ref 30.0–36.0)
MCV: 94.5 fL (ref 80.0–100.0)
Platelets: 149 K/uL — ABNORMAL LOW (ref 150–400)
RBC: 2.91 MIL/uL — ABNORMAL LOW (ref 3.87–5.11)
RDW: 17.3 % — ABNORMAL HIGH (ref 11.5–15.5)
WBC: 13.6 K/uL — ABNORMAL HIGH (ref 4.0–10.5)
nRBC: 0 % (ref 0.0–0.2)

## 2024-01-18 LAB — BASIC METABOLIC PANEL WITH GFR
Anion gap: 7 (ref 5–15)
BUN: 12 mg/dL (ref 6–20)
CO2: 29 mmol/L (ref 22–32)
Calcium: 8.4 mg/dL — ABNORMAL LOW (ref 8.9–10.3)
Chloride: 106 mmol/L (ref 98–111)
Creatinine, Ser: 0.51 mg/dL (ref 0.44–1.00)
GFR, Estimated: >60 mL/min
Glucose, Bld: 160 mg/dL — ABNORMAL HIGH (ref 70–99)
Potassium: 3.9 mmol/L (ref 3.5–5.1)
Sodium: 141 mmol/L (ref 135–145)

## 2024-01-18 NOTE — Plan of Care (Signed)
  Problem: Activity: Goal: Risk for activity intolerance will decrease Outcome: Progressing   Problem: Coping: Goal: Level of anxiety will decrease Outcome: Progressing   Problem: Pain Managment: Goal: General experience of comfort will improve and/or be controlled Outcome: Progressing   Problem: Safety: Goal: Ability to remain free from injury will improve Outcome: Progressing

## 2024-01-18 NOTE — Progress Notes (Signed)
 Spoke with Dr. Viviana at [phone]. Informed her of attempts made to contact the doctor for this pt. She stated that the doctor following her tonight will be Dr. Leonce Ada. She shared the following phone number to reach her at, being [phone]. Spoke with Dr. Leonce Ada directly and shared with her the information of pt's status (see note from today at 2004). Orders received.

## 2024-01-18 NOTE — Progress Notes (Signed)
 Call service with from [phone] stated that she had contacted the on call provider and it is Dr. Obadiah. Spoke with him. He stated that he is only taking call for outpts not inpts

## 2024-01-18 NOTE — Progress Notes (Signed)
" °   01/18/24 2244  BiPAP/CPAP/SIPAP  BiPAP/CPAP/SIPAP Pt Type Adult  BiPAP/CPAP/SIPAP Resmed  Reason BIPAP/CPAP not in use Other(comment) (pt does not want to wear tonight)  Mask Type Nasal mask    "

## 2024-01-18 NOTE — Progress Notes (Signed)
 [facility]  Date of Visit: 12/02/2023  History of Present Illness: Ms. Miranda Hill is a 58 y.o. female POD#4  Surgery: s/p Exam under anesthesia, abdominal uterosacral ligament vaginal vault suspension, posterior repair, perineoplasty, and cystoscopy on 01/14/24 - Diagnostic laparoscopy, conversion to exploratory laparotomy with radical tumor debulking including total omentectomy, peritoneal stripping, excision of multiple small bowel mesenteric nodules, argon laser of peritoneal nodules, small bowel mesenteric nodules and rectal nodules; total hysterectomy with bilateral salpingo-oophorectomy; appendectomy by Dr. Viktoria  She passed her postoperative void trial POD#1, PVR on POD#2.    Today she continues to report intermittent nausea exacerbated by movement required during bedside X-ray, vomited overnight. NPO with resumption of IVF and s/p abdominal X-ray today  Reports 7/10 abdominal pain. However overall reports feeling better, sitting in chair. Denies CP, SOB, passing flatus or bowel movement Ambulating with minimal assistance Minimal spotting and reports timed voids every 3-4 hours and double voiding. Denies UTI symptoms of hematuria, dysuria or sensation of incomplete emptying with position changes. Improved HR attributed to anxiety regarding concerns with family issues, CTA negative yesterday. Using IS and CPAP at night with discomfort   Pathology results:  A. 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  B. 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  C. 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  D. UTERUS, CERVIX, BILATERAL TUBES AND OVARIES: -  1 unoriented fallopian tube involved by high-grade serous carcinoma measuring 12 mm in greatest linear  dimension. -  Unremarkable cervix, negative for dysplasia. -  Atrophic appearing endometrium with adenomyosis. -  Overall unremarkable myometrium. -  Posterior reflection/serosa with involvement by the patient's known high-grade serous carcinoma. -  See synoptic report below for further information.  E. 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  G. 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  H. APPENDIX: -  Serosal involvement by the patient's known high-grade serous carcinoma  I. 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  Medications:   Current Facility-Administered Medications (Analgesics):    acetaminophen  (TYLENOL ) tablet 650 mg   HYDROmorphone  (DILAUDID ) injection 0.5-1 mg   HYDROmorphone  (DILAUDID ) tablet 2-4 mg  Current Facility-Administered Medications (Hematological):    enoxaparin  (LOVENOX ) injection 40 mg  Current Facility-Administered Medications (Other):    ALPRAZolam  (XANAX ) tablet 0.25 mg   Chlorhexidine  Gluconate Cloth 2 % PADS 6 each   dextrose  5 % and 0.45 % NaCl infusion   feeding supplement (ENSURE PLUS HIGH PROTEIN) liquid 237 mL   lip balm (CARMEX) ointment   ondansetron  (ZOFRAN ) tablet 4 mg **OR** ondansetron  (ZOFRAN ) injection 4 mg   pantoprazole  (PROTONIX ) EC tablet 40 mg   phenol (CHLORASEPTIC) mouth spray 1 spray   polyethylene glycol (MIRALAX  / GLYCOLAX ) packet 17 g   prochlorperazine  (COMPAZINE ) injection 5 mg   simethicone  (MYLICON) chewable tablet 80 mg   sodium chloride  flush (NS) 0.9 % injection 10-40 mL   sodium chloride  flush (NS) 0.9 % injection 10-40 mL  No current outpatient medications on file.   Allergies: Patient is allergic to tree extract, codeine, latex,  oxycodone , tramadol, and paclitaxel .   Physical Exam: BP 127/71 (BP Location: Left Arm)  Pulse 91   Temp 98.2 F (36.8 C) (Oral)   Resp 18   Ht 5' 2 (1.575 m)   Wt 78.9 kg   SpO2 98%   BMI 31.83 kg/m   Abdomen: soft, nondistended, tenderness mild in the LUQ and in the LLQ, and without guarding Drain in place.  Abdominal Incisions: healing well, no significant drainage, no dehiscence, honeycomb dressing in place with Tegaderm.  Pelvic Examination: deferred,  minimal blood on pad  Lab Results  Component Value Date   WBC 13.6 (H) 01/18/2024   HGB 8.8 (L) 01/18/2024   HCT 27.5 (L) 01/18/2024   MCV 94.5 01/18/2024   PLT 149 (L) 01/18/2024   Lab Results  Component Value Date   CREATININE 0.51 01/18/2024    Intake/Output Summary (Last 24 hours) at 01/18/2024 1325 Last data filed at 01/18/2024 1000 Gross per 24 hour  Intake 1416.47 ml  Output 750 ml  Net 666.47 ml   I/O last 3 completed shifts: In: 2136.5 [P.O.:800; I.V.:836.5; IV Piggyback:500] Out: 1430 [Urine:1100; Emesis/NG output:50; Drains:280]  EXAM: 1 VIEW XRAY OF THE ABDOMEN 01/18/2024 10:13:57 AM   COMPARISON: None available.   CLINICAL HISTORY: Ileus, postoperative (HCC).   FINDINGS:   LINES, TUBES AND DEVICES: Surgical drain overlies the left hemiabdomen terminating in the pelvis.   BOWEL: Gaseous distention of the stomach, a few scattered segments of small bowel, and throughout the colon. No abrupt transition point to suggest obstruction.   SOFT TISSUES: No abnormal calcifications.   BONES: No acute fracture. Thoracolumbar osteophytosis.   IMPRESSION: 1. Gaseous distention of the stomach, scattered segments of small bowel, and throughout the colon, favored to represent changes of a postoperative ileus. Continued radiographic follow-up recommended.   Electronically signed by: Rogelia Myers MD 01/18/2024 12:32 PM EST RP Workstation: [id number]    ---------------------------------------------------------  Assessment and Plan:  1. Postoperative pain   2. Malignant neoplasm of ovary, unspecified laterality (HCC)   3. Preop testing   4. Mixed stress and urge urinary incontinence   5. Postoperative nausea   6. Gastroesophageal reflux disease without esophagitis   7. Situational anxiety   8. Obstructive sleep apnea syndrome   9. Gynecologic malignancy (HCC)    - Gen - pain 7/10 controlled with IV meds due to ileus. Reports history of nausea with tramadol and oxycodone  use. Resume tylenol  PRN moderate pain for multimodal pain control once tolerating PO and consider Ofirmev  due to history of opioid induce nausea.  - Cardio/pulm - tacyhcardia resolved on anxiolytic, denies CP/SOB, negative CTA 01/16/24. Continue to monitor and consider additional workup if persistent or clinical change. Hgb trending down from 11 to 8.1 and stable at 8.8 on repeat, continue to monitor. - GI - X-ray consistent with ileus. Nausea with scopolamine  in place with history of PONV. Encouraged to continue ambulation and oral challenge. Encouraged chewing gum, denies GERD symptoms with Protonix  daily. NG removed on POD#1. Ordered nutritional supplement and simethicone  PRN gas pain. - GU - UOP WNL. Cr WNL at 0.51. Pass void trial on POD#1. H/o rectus fascial sling, prior sensation of incomplete emptying and leakage resolved with Trospium  preop, can resume after return of GI function. Continue to monitor for signs and symptoms of urinary retention, postvoid PVR 108 on POD#2. Continue timed voids and double voiding to ensure bladder emptying. - VTE - encouraged ambulation, continue lovenox  - Dispo - pending gyn onc recommendations.  - Pathology results  pending  - Discussed avoidance of heavy lifting and straining long term to  reduce the risk of recurrence.   All questions answered.

## 2024-01-18 NOTE — Progress Notes (Signed)
 4 Days Post-Op Procedures (LRB): LAPAROSCOPY, DIAGNOSTIC (N/A) HYSTERECTOMY, TOTAL, ABDOMINAL, WITH BILATERAL SALPINGO-OOPHORECTOMY, WITH NEOPLASM DEBULKING, APPENDECTOMY, OMENECTOMY (N/A) SUSPENSION, VAGINAL VAULT (N/A) ANTERIOR (CYSTOCELE) AND POSTERIOR REPAIR (RECTOCELE) (N/A) PERINEOPLASTY (N/A) CREATION, PUBOVAGINAL SLING (N/A) CYSTOSCOPY (N/A)  Subjective: Multiple episodes of emesis yesterday.  None overnight. -flatus.  Objective: Vital signs in last 24 hours: Temp:  [98.1 F (36.7 C)-99.1 F (37.3 C)] 98.7 F (37.1 C) (12/21 0348) Pulse Rate:  [92-126] 111 (12/21 0348) Resp:  [15-20] 15 (12/21 0348) BP: (120-164)/(66-96) 120/66 (12/21 0348) SpO2:  [92 %-98 %] 92 % (12/21 0348) FiO2 (%):  [21 %] 21 % (12/20 2022) Last BM Date : 01/13/24  Intake/Output from previous day: 12/20 0701 - 12/21 0700 In: 1896.5 [P.O.:560; I.V.:836.5; IV Piggyback:500] Out: 780 [Urine:500; Emesis/NG output:50; Drains:230]  Physical Examination: General: alert, cooperative, and no distress Resp: clear to auscultation bilaterally Cardio: regular rate and rhythm, S1, S2 normal, no murmur, click, rub or gallop and regular in rhythm, tachycardic.  GI: incision: clean, dry, intact, and abdomen softly distended, hypoactive bowel sounds, mildly tympanic, binder in place, JP drain with serous output and abdominal incisions are intact with op site dressing over midline with no active drainage noted underneath Extremities: no c/c/e  Labs: WBC/Hgb/Hct/Plts:  13.6/8.8/27.5/149 (12/21 0458) BUN/Cr/glu/ALT/AST/amyl/lip:  12/0.51/--/--/--/--/-- (12/21 0458) No results found.   Assessment: 58 y.o. s/p Procedures: LAPAROSCOPY, DIAGNOSTIC HYSTERECTOMY, TOTAL, ABDOMINAL, WITH BILATERAL SALPINGO-OOPHORECTOMY, WITH NEOPLASM DEBULKING, APPENDECTOMY, OMENECTOMY SUSPENSION, VAGINAL VAULT ANTERIOR (CYSTOCELE) AND POSTERIOR REPAIR (RECTOCELE) PERINEOPLASTY CREATION, PUBOVAGINAL SLING CYSTOSCOPY:  stable   Pain:  Pain is well-controlled on PRN medications.  Heme: Postop anemia. Hgb 8.8. Stable  CV: BP stable. Tachycardic--stable.   GI:  Postop ileus  FEN: No significant metabolic derangements.  Prophylaxis: SCDs and lovenox .     Plan: KUB/upright Mg/LFTs/Amylase/lipase ordered NPO, bowel rest, supportive care Encouraged mobility with assist Continue IS Continue anxiolytic    LOS: 4 days    Olam Mill 01/18/2024, 9:02 AM

## 2024-01-18 NOTE — Progress Notes (Signed)
 Pt reported putting out low amounts of urine throughout the day and having emesis several times throughout the day, in addition to previous nurse reporting the same, in regards to emesis. Pt appears pale and weak, with heart rate in 120s and regular. No reports of pain at present time. JP drain has little amount of ss output. Placed call to Dr. Lewie office for on call doctor. Received auto response to call [phone]. Dialed this number and spoke with call service. They stated that the doctor who would be returning the call is Dr. Tina. Awaiting return call.

## 2024-01-19 LAB — LIPASE, BLOOD: Lipase: <10 U/L — ABNORMAL LOW (ref 11–51)

## 2024-01-19 LAB — BASIC METABOLIC PANEL WITH GFR
Anion gap: 7 (ref 5–15)
BUN: 7 mg/dL (ref 6–20)
CO2: 28 mmol/L (ref 22–32)
Calcium: 8.4 mg/dL — ABNORMAL LOW (ref 8.9–10.3)
Chloride: 104 mmol/L (ref 98–111)
Creatinine, Ser: 0.46 mg/dL (ref 0.44–1.00)
GFR, Estimated: >60 mL/min
Glucose, Bld: 159 mg/dL — ABNORMAL HIGH (ref 70–99)
Potassium: 3.4 mmol/L — ABNORMAL LOW (ref 3.5–5.1)
Sodium: 139 mmol/L (ref 135–145)

## 2024-01-19 LAB — HEPATIC FUNCTION PANEL
ALT: 18 U/L (ref 0–44)
AST: 14 U/L — ABNORMAL LOW (ref 15–41)
Albumin: 3.2 g/dL — ABNORMAL LOW (ref 3.5–5.0)
Alkaline Phosphatase: 56 U/L (ref 38–126)
Bilirubin, Direct: 0.2 mg/dL (ref 0.0–0.2)
Indirect Bilirubin: 0.1 mg/dL — ABNORMAL LOW (ref 0.3–0.9)
Total Bilirubin: 0.3 mg/dL (ref 0.0–1.2)
Total Protein: 5.4 g/dL — ABNORMAL LOW (ref 6.5–8.1)

## 2024-01-19 LAB — CBC
HCT: 23.3 % — ABNORMAL LOW (ref 36.0–46.0)
Hemoglobin: 7.3 g/dL — ABNORMAL LOW (ref 12.0–15.0)
MCH: 29.7 pg (ref 26.0–34.0)
MCHC: 31.3 g/dL (ref 30.0–36.0)
MCV: 94.7 fL (ref 80.0–100.0)
Platelets: 119 K/uL — ABNORMAL LOW (ref 150–400)
RBC: 2.46 MIL/uL — ABNORMAL LOW (ref 3.87–5.11)
RDW: 17 % — ABNORMAL HIGH (ref 11.5–15.5)
WBC: 7.6 K/uL (ref 4.0–10.5)
nRBC: 0 % (ref 0.0–0.2)

## 2024-01-19 LAB — AMYLASE: Amylase: 20 U/L — ABNORMAL LOW (ref 28–100)

## 2024-01-19 LAB — MAGNESIUM: Magnesium: 1.9 mg/dL (ref 1.7–2.4)

## 2024-01-19 MED ORDER — DEXTROSE-SODIUM CHLORIDE 5-0.45 % IV SOLN: INTRAVENOUS | Status: DC

## 2024-01-19 MED ORDER — KCL IN DEXTROSE-NACL 10-5-0.45 MEQ/L-%-% IV SOLN
INTRAVENOUS | Status: DC
  Filled 2024-01-19 (×4): qty 1000

## 2024-01-19 NOTE — Progress Notes (Signed)
 [facility]  Date of Visit: 12/02/2023  History of Present Illness: Miranda Hill is a 58 y.o. female POD#5  Surgery: s/p Exam under anesthesia, abdominal uterosacral ligament vaginal vault suspension, posterior repair, perineoplasty, and cystoscopy on 01/14/24 - Diagnostic laparoscopy, conversion to exploratory laparotomy with radical tumor debulking including total omentectomy, peritoneal stripping, excision of multiple small bowel mesenteric nodules, argon laser of peritoneal nodules, small bowel mesenteric nodules and rectal nodules; total hysterectomy with bilateral salpingo-oophorectomy; appendectomy by Dr. Viktoria  She passed her postoperative void trial POD#1, PVR on POD#2.    Today she continues to report intermittent nausea, denies vomited overnight. Reports dry mouth and NPO with IV hydration Reports abdominal pain and tightness from abdominal binder Denies CP, SOB, passing flatus or bowel movement Ambulating with minimal assistance Minimal spotting and reports urge to void without timed voids since resuming IV fluids. Continues double voiding to ensure bladder emptying. Denies UTI symptoms of hematuria, dysuria or sensation of incomplete emptying with position changes. Using IS and declined CPAP at night with discomfort   Pathology results:  A. 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  B. 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  C. 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  D. UTERUS, CERVIX, BILATERAL TUBES AND OVARIES: -  1 unoriented fallopian tube involved by high-grade serous carcinoma measuring 12 mm in greatest linear dimension. -  Unremarkable cervix, negative for dysplasia. -  Atrophic appearing endometrium with adenomyosis. -  Overall  unremarkable myometrium. -  Posterior reflection/serosa with involvement by the patient's known high-grade serous carcinoma. -  See synoptic report below for further information.  E. 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  G. 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  H. APPENDIX: -  Serosal involvement by the patient's known high-grade serous carcinoma  I. 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  Medications:   Current Facility-Administered Medications (Analgesics):    acetaminophen  (TYLENOL ) tablet 650 mg   HYDROmorphone  (DILAUDID ) injection 0.5-1 mg   HYDROmorphone  (DILAUDID ) tablet 2-4 mg  Current Facility-Administered Medications (Hematological):    enoxaparin  (LOVENOX ) injection 40 mg  Current Facility-Administered Medications (Other):    ALPRAZolam  (XANAX ) tablet 0.25 mg   Chlorhexidine  Gluconate Cloth 2 % PADS 6 each   feeding supplement (ENSURE PLUS HIGH PROTEIN) liquid 237 mL   lip balm (CARMEX) ointment   ondansetron  (ZOFRAN ) tablet 4 mg **OR** ondansetron  (ZOFRAN ) injection 4 mg   pantoprazole  (PROTONIX ) EC tablet 40 mg   phenol (CHLORASEPTIC) mouth spray 1 spray   polyethylene glycol (MIRALAX  / GLYCOLAX ) packet 17 g   prochlorperazine  (COMPAZINE ) injection 5 mg   simethicone  (MYLICON) chewable tablet 80 mg   sodium chloride  flush (NS) 0.9 % injection 10-40 mL   sodium chloride  flush (NS) 0.9 % injection 10-40 mL  No current outpatient medications on file.   Allergies: Patient is allergic to tree extract, codeine, latex, oxycodone , tramadol, and paclitaxel .   Physical Exam: BP 127/67 (BP Location: Left Arm)   Pulse 94   Temp 97.7 F (36.5 C) (Oral)   Resp 18   Ht 5' 2 (1.575 m)    Wt  80 kg   SpO2 96%   BMI 32.26 kg/m   Abdomen: soft, nondistended, tenderness mild in the LUQ and in the LLQ, and without guarding Drain in place.  Abdominal Incisions: healing well, no significant drainage, no dehiscence, honeycomb dressing in place with Tegaderm.  Pelvic Examination: deferred,  minimal blood on pad  Lab Results  Component Value Date   WBC 7.6 01/19/2024   HGB 7.3 (L) 01/19/2024   HCT 23.3 (L) 01/19/2024   MCV 94.7 01/19/2024   PLT 119 (L) 01/19/2024   Lab Results  Component Value Date   CREATININE 0.46 01/19/2024    Intake/Output Summary (Last 24 hours) at 01/19/2024 0820 Last data filed at 01/19/2024 0554 Gross per 24 hour  Intake 2055.46 ml  Output 1690 ml  Net 365.46 ml   I/O last 3 completed shifts: In: 3391.9 [I.V.:2891.9; IV Piggyback:500] Out: 2020 [Urine:1700; Drains:320]  EXAM: 1 VIEW XRAY OF THE ABDOMEN 01/18/2024 10:13:57 AM   COMPARISON: None available.   CLINICAL HISTORY: Ileus, postoperative (HCC).   FINDINGS:   LINES, TUBES AND DEVICES: Surgical drain overlies the left hemiabdomen terminating in the pelvis.   BOWEL: Gaseous distention of the stomach, a few scattered segments of small bowel, and throughout the colon. No abrupt transition point to suggest obstruction.   SOFT TISSUES: No abnormal calcifications.   BONES: No acute fracture. Thoracolumbar osteophytosis.   IMPRESSION: 1. Gaseous distention of the stomach, scattered segments of small bowel, and throughout the colon, favored to represent changes of a postoperative ileus. Continued radiographic follow-up recommended.   Electronically signed by: Rogelia Myers MD 01/18/2024 12:32 PM EST RP Workstation: [id number]   ---------------------------------------------------------  Assessment and Plan:  1. Postoperative pain   2. Malignant neoplasm of ovary, unspecified laterality (HCC)   3. Preop testing   4. Mixed stress and urge urinary incontinence   5.  Postoperative nausea   6. Gastroesophageal reflux disease without esophagitis   7. Situational anxiety   8. Obstructive sleep apnea syndrome   9. Gynecologic malignancy (HCC)    - Gen - pain 7/10 controlled with IV meds due to ileus. Reports history of nausea with tramadol and oxycodone  use. Resume tylenol  PRN moderate pain for multimodal pain control once tolerating PO and consider Ofirmev  due to history of opioid induce nausea.  - Cardio/pulm - tacyhcardia resolved, continue anxiolytic PRN, denies CP/SOB, negative CTA 01/16/24. Continue to monitor and consider additional workup if persistent or clinical change. Hgb trending down from 11 to 8.1 and t 7.3 on repeat possibly due to hemodilution from IV fluids, continue to monitor. - GI - X-ray consistent with ileus. Nausea with scopolamine  in place with history of PONV. Encouraged to continue ambulation and oral challenge. Encouraged chewing gum, denies GERD symptoms with Protonix  daily. NG removed on POD#1. Resume nutritional supplements with diet advancement and simethicone  PRN gas pain. - GU - UOP WNL. Cr WNL at 0.51. Pass void trial on POD#1. H/o rectus fascial sling, prior sensation of incomplete emptying and leakage resolved with Trospium  preop, can resume after return of GI function. Continue to monitor for signs and symptoms of urinary retention, postvoid PVR 108 on POD#2. Continue to monitor spontaneous voids and double voiding to ensure bladder emptying. Repeat PVR if clinical change. - VTE - encouraged ambulation, continue lovenox  - Dispo - pending gyn onc recommendations.  - Discussed avoidance of heavy lifting and straining long term to reduce the risk of recurrence.   All questions answered.

## 2024-01-19 NOTE — Progress Notes (Signed)
 5 Days Post-Op Procedures (LRB): LAPAROSCOPY, DIAGNOSTIC (N/A) HYSTERECTOMY, TOTAL, ABDOMINAL, WITH BILATERAL SALPINGO-OOPHORECTOMY, WITH NEOPLASM DEBULKING, APPENDECTOMY, OMENECTOMY (N/A) SUSPENSION, VAGINAL VAULT (N/A) ANTERIOR (CYSTOCELE) AND POSTERIOR REPAIR (RECTOCELE) (N/A) PERINEOPLASTY (N/A) CREATION, PUBOVAGINAL SLING (N/A) CYSTOSCOPY (N/A)  Subjective: Mild nausea when she is up and more active. No emesis. No flatus or BM. Voiding without difficulty. Denies chest pain, dyspnea. Continuing to ambulate. Using IS. Husband at the bedside.  Objective: Vital signs in last 24 hours: Temp:  [97.5 F (36.4 C)-98.3 F (36.8 C)] 97.7 F (36.5 C) (12/22 9367) Pulse Rate:  [92-96] 94 (12/22 0632) Resp:  [16-18] 18 (12/22 0632) BP: (118-135)/(67-72) 127/67 (12/22 0632) SpO2:  [96 %-100 %] 96 % (12/22 9367) Weight:  [176 lb 5.9 oz (80 kg)] 176 lb 5.9 oz (80 kg) (12/22 0500) Last BM Date : 01/13/24  Intake/Output from previous day: 12/21 0701 - 12/22 0700 In: 2055.5 [I.V.:2055.5] Out: 1690 [Urine:1500; Drains:190]  Physical Examination: General: alert, cooperative, and no distress Resp: clear to auscultation bilaterally Cardio: regular rate and rhythm, S1, S2 normal, no murmur, click, rub or gallop and regular in rhythm, tachycardia improved.  GI: incision: clean, dry, intact, and abdomen softly distended, hypoactive bowel sounds, mildly tympanic, binder in place, JP drain with serous output and abdominal incisions are intact with op site dressing over midline with no active drainage noted underneath Extremities: no c/c/e  Labs: WBC/Hgb/Hct/Plts:  7.6/7.3/23.3/119 (12/22 9661) BUN/Cr/glu/ALT/AST/amyl/lip:  7/0.46/--/18/14/20/<10 (12/22 9661) No results found.   Assessment: 58 y.o. s/p Procedures: LAPAROSCOPY, DIAGNOSTIC HYSTERECTOMY, TOTAL, ABDOMINAL, WITH BILATERAL SALPINGO-OOPHORECTOMY, WITH NEOPLASM DEBULKING, APPENDECTOMY, OMENECTOMY SUSPENSION, VAGINAL VAULT ANTERIOR  (CYSTOCELE) AND POSTERIOR REPAIR (RECTOCELE) PERINEOPLASTY CREATION, PUBOVAGINAL SLING CYSTOSCOPY: stable  Pain:  Pain is well-controlled on PRN medications.  Heme: Postop anemia. Hgb 7.3 this am. Continue to monitor. JP drain coming from pelvis/abdomen with serous drainage.  CV: BP stable. Tachycardic-improved. CT angio chest performed on 12/19 to rule out PE given desaturation and tachycardia-negative.  GI:  Postop ileus. Awaiting return of bowel function. Abd xray performed on 01/18/24 resulting changes of a post-op ileus.   FEN: No significant metabolic derangements.  Prophylaxis: SCDs and lovenox .     Plan: Diet to sips of clears. Possibility of TPN discussed if diet is unable to be advanced given ileus Reviewed IS IVF resumed Encouraged increasing mobility Continue with current plan of care   LOS: 5 days    Miranda Hill 01/19/2024, 11:57 AM

## 2024-01-19 NOTE — Plan of Care (Signed)

## 2024-01-20 LAB — CBC
HCT: 25.2 % — ABNORMAL LOW (ref 36.0–46.0)
Hemoglobin: 8.2 g/dL — ABNORMAL LOW (ref 12.0–15.0)
MCH: 30.3 pg (ref 26.0–34.0)
MCHC: 32.5 g/dL (ref 30.0–36.0)
MCV: 93 fL (ref 80.0–100.0)
Platelets: 156 K/uL (ref 150–400)
RBC: 2.71 MIL/uL — ABNORMAL LOW (ref 3.87–5.11)
RDW: 16.6 % — ABNORMAL HIGH (ref 11.5–15.5)
WBC: 7.5 K/uL (ref 4.0–10.5)
nRBC: 0 % (ref 0.0–0.2)

## 2024-01-20 LAB — BASIC METABOLIC PANEL WITH GFR
Anion gap: 9 (ref 5–15)
BUN: <5 mg/dL — ABNORMAL LOW (ref 6–20)
CO2: 29 mmol/L (ref 22–32)
Calcium: 9.1 mg/dL (ref 8.9–10.3)
Chloride: 104 mmol/L (ref 98–111)
Creatinine, Ser: 0.5 mg/dL (ref 0.44–1.00)
GFR, Estimated: >60 mL/min
Glucose, Bld: 142 mg/dL — ABNORMAL HIGH (ref 70–99)
Potassium: 3.4 mmol/L — ABNORMAL LOW (ref 3.5–5.1)
Sodium: 142 mmol/L (ref 135–145)

## 2024-01-20 MED ORDER — BISACODYL 10 MG RE SUPP
10.0000 mg | Freq: Every day | RECTAL | Status: DC | PRN
  Administered 2024-01-20: 10 mg via RECTAL
  Filled 2024-01-20: qty 1

## 2024-01-20 MED ORDER — MIRABEGRON ER 25 MG PO TB24
25.0000 mg | ORAL_TABLET | Freq: Every day | ORAL | Status: DC
  Administered 2024-01-20 – 2024-01-22 (×3): 25 mg via ORAL
  Filled 2024-01-20 (×3): qty 1

## 2024-01-20 NOTE — Progress Notes (Addendum)
 [facility]  Date of Visit: 12/02/2023  History of Present Illness: Miranda Hill is a 58 y.o. female POD#6  Surgery: s/p Exam under anesthesia, abdominal uterosacral ligament vaginal vault suspension, posterior repair, perineoplasty, and cystoscopy on 01/14/24 - Diagnostic laparoscopy, conversion to exploratory laparotomy with radical tumor debulking including total omentectomy, peritoneal stripping, excision of multiple small bowel mesenteric nodules, argon laser of peritoneal nodules, small bowel mesenteric nodules and rectal nodules; total hysterectomy with bilateral salpingo-oophorectomy; appendectomy by Dr. Viktoria  She passed her postoperative void trial POD#1, PVR on POD#2.    Today she reports feeling tearful due to increased urinary frequency overnight attributed to IV fluids. Voided 5x last night. Denies dysuria, hematuria and reports delay in care from nursing.  Reports feeling sad and missing her crafts at home, makes Christmas ornaments around the holidays. Denies nausea or vomited overnight, reports belching. Tolerating clear liquids overnight and reports desire to advance diet this morning. Reports abdominal pain well controlled and continue use of abdominal binder Denies CP, SOB, passing flatus or bowel movement Ambulating with minimal assistance Minimal spotting and continues double voiding to ensure bladder emptying. Declined CPAP at night with discomfort   Pathology results:  A. 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  B. 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  C. 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  D. UTERUS, CERVIX, BILATERAL TUBES AND OVARIES: -  1 unoriented fallopian tube involved by high-grade serous carcinoma measuring 12  mm in greatest linear dimension. -  Unremarkable cervix, negative for dysplasia. -  Atrophic appearing endometrium with adenomyosis. -  Overall unremarkable myometrium. -  Posterior reflection/serosa with involvement by the patient's known high-grade serous carcinoma. -  See synoptic report below for further information.  E. 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  G. 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  H. APPENDIX: -  Serosal involvement by the patient's known high-grade serous carcinoma  I. 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  Medications:   Current Facility-Administered Medications (Analgesics):    acetaminophen  (TYLENOL ) tablet 650 mg   HYDROmorphone  (DILAUDID ) injection 0.5-1 mg   HYDROmorphone  (DILAUDID ) tablet 2-4 mg  Current Facility-Administered Medications (Hematological):    enoxaparin  (LOVENOX ) injection 40 mg  Current Facility-Administered Medications (Other):    ALPRAZolam  (XANAX ) tablet 0.25 mg   bisacodyl  (DULCOLAX) suppository 10 mg   Chlorhexidine  Gluconate Cloth 2 % PADS 6 each   dextrose  5 % and 0.45 % NaCl with KCl 10 mEq/L infusion   feeding supplement (ENSURE PLUS HIGH PROTEIN) liquid 237 mL   lip balm (CARMEX) ointment   mirabegron  ER (MYRBETRIQ ) tablet 25 mg   ondansetron  (ZOFRAN ) tablet 4 mg **OR** ondansetron  (ZOFRAN ) injection 4 mg   pantoprazole  (PROTONIX ) EC tablet 40 mg   phenol (CHLORASEPTIC) mouth spray 1 spray   polyethylene glycol (MIRALAX  / GLYCOLAX ) packet 17 g   prochlorperazine  (COMPAZINE ) injection 5 mg   simethicone  (MYLICON) chewable tablet 80 mg   sodium chloride  flush (NS) 0.9 % injection 10-40 mL   sodium chloride  flush (NS) 0.9 % injection 10-40  mL  No current outpatient medications on file.  Allergies: Patient is allergic to tree extract, codeine, latex, oxycodone , tramadol, and paclitaxel .   Physical Exam: BP 134/76 (BP Location: Left Arm)   Pulse 99   Temp 98.8 F (37.1 C) (Oral)   Resp 15   Ht 5' 2 (1.575 m)   Wt 80.8 kg   SpO2 100%   BMI 32.58 kg/m   Abdomen: soft, nondistended, tenderness mild in the LUQ and in the LLQ, and without guarding Drain in place.  Abdominal Incisions: healing well, no significant drainage, no dehiscence, honeycomb dressing in place with Tegaderm.  Pelvic Examination: deferred,  minimal blood on pad  Lab Results  Component Value Date   WBC 7.6 01/19/2024   HGB 7.3 (L) 01/19/2024   HCT 23.3 (L) 01/19/2024   MCV 94.7 01/19/2024   PLT 119 (L) 01/19/2024   Lab Results  Component Value Date   CREATININE 0.46 01/19/2024    Intake/Output Summary (Last 24 hours) at 01/20/2024 0843 Last data filed at 01/20/2024 0553 Gross per 24 hour  Intake 1288.75 ml  Output 1765 ml  Net -476.25 ml   I/O last 3 completed shifts: In: 1913.6 [I.V.:1913.6] Out: 2655 [Urine:2500; Drains:155]  EXAM: 1 VIEW XRAY OF THE ABDOMEN 01/18/2024 10:13:57 AM   COMPARISON: None available.   CLINICAL HISTORY: Ileus, postoperative (HCC).   FINDINGS:   LINES, TUBES AND DEVICES: Surgical drain overlies the left hemiabdomen terminating in the pelvis.   BOWEL: Gaseous distention of the stomach, a few scattered segments of small bowel, and throughout the colon. No abrupt transition point to suggest obstruction.   SOFT TISSUES: No abnormal calcifications.   BONES: No acute fracture. Thoracolumbar osteophytosis.   IMPRESSION: 1. Gaseous distention of the stomach, scattered segments of small bowel, and throughout the colon, favored to represent changes of a postoperative ileus. Continued radiographic follow-up recommended.   Electronically signed by: Miranda Myers MD 01/18/2024 12:32 PM EST  RP Workstation: [id number]   ---------------------------------------------------------  Assessment and Plan:  1. Postoperative pain   2. Nocturia   3. Malignant neoplasm of ovary, unspecified laterality (HCC)   4. Preop testing   5. Mixed stress and urge urinary incontinence   6. Postoperative nausea   7. Gastroesophageal reflux disease without esophagitis   8. Situational anxiety   9. Obstructive sleep apnea syndrome   10. Gynecologic malignancy (HCC)    - Gen - pain 7/10 controlled with IV meds due to ileus. Reports history of nausea with tramadol and oxycodone  use. Resume tylenol  PRN moderate pain for multimodal pain control once tolerating PO and consider Ofirmev  due to history of opioid induce nausea.  - Cardio/pulm - tacyhcardia resolved, continue anxiolytic PRN, denies CP/SOB, negative CTA 01/16/24. Encouraged stress management and resumption of crafts and ornament making for wellness.  Hgb trending down from 11 to 8.1 and t 7.3 on repeat possibly due to hemodilution from IV fluids, continue to monitor if clinical change. - GI - X-ray consistent with ileus. Reduced nausea with history of PONV. Encouraged to continue ambulation. Encouraged chewing gum, denies GERD symptoms with Protonix  daily. NG removed on POD#1. Resume nutritional supplements with diet advancement and simethicone  PRN gas pain. Defer to gyn onc for advancement of diet - GU - UOP WNL. Cr WNL at 0.51. Pass void trial on POD#1. H/o rectus fascial sling, prior sensation of incomplete emptying and leakage resolved with Trospium  preop, can resume after return of GI function upon discharge. Continue to monitor for signs and symptoms of urinary retention, postvoid PVR 108 on POD#2. Continue  to monitor spontaneous voids and double voiding to ensure bladder emptying. Repeat PVR if clinical change. Added mirabegron  25mg  for nocturia to resume once tolerating PO. - VTE - encouraged ambulation, continue lovenox  - Dispo - pending gyn  onc recommendations.  - Discussed avoidance of heavy lifting and straining long term to reduce the risk of recurrence.   All questions answered.

## 2024-01-20 NOTE — Progress Notes (Signed)
" °   01/20/24 2251  BiPAP/CPAP/SIPAP  BiPAP/CPAP/SIPAP Pt Type Adult  Reason BIPAP/CPAP not in use Non-compliant    "

## 2024-01-20 NOTE — Progress Notes (Signed)
 6 Days Post-Op Procedures (LRB): LAPAROSCOPY, DIAGNOSTIC (N/A) HYSTERECTOMY, TOTAL, ABDOMINAL, WITH BILATERAL SALPINGO-OOPHORECTOMY, WITH NEOPLASM DEBULKING, APPENDECTOMY, OMENECTOMY (N/A) SUSPENSION, VAGINAL VAULT (N/A) ANTERIOR (CYSTOCELE) AND POSTERIOR REPAIR (RECTOCELE) (N/A) PERINEOPLASTY (N/A) CREATION, PUBOVAGINAL SLING (N/A) CYSTOSCOPY (N/A)  Subjective: Patient reports no nausea or emesis. Feels hungry. No flatus or BM. Continuing to ambulate and use IS.  Objective: Vital signs in last 24 hours: Temp:  [98.5 F (36.9 C)-98.8 F (37.1 C)] 98.8 F (37.1 C) (12/23 0513) Pulse Rate:  [99-105] 99 (12/23 0513) Resp:  [15] 15 (12/23 0513) BP: (132-134)/(57-76) 134/76 (12/23 0513) SpO2:  [93 %-100 %] 100 % (12/23 0513) Weight:  [178 lb 2.1 oz (80.8 kg)] 178 lb 2.1 oz (80.8 kg) (12/23 0500) Last BM Date : 01/13/24  Intake/Output from previous day: 12/22 0701 - 12/23 0700 In: 1288.8 [I.V.:1288.8] Out: 1765 [Urine:1700; Drains:65]  Physical Examination (performed by Dr. JERELD): General: alert, cooperative, and no distress Resp: clear to auscultation bilaterally Cardio: regular rate and rhythm, S1, S2 normal, no murmur, click, rub or gallop and regular in rhythm, tachycardia improved.  GI: incision: clean, dry, intact, and abdomen softly distended, hypoactive bowel sounds, mildly tympanic, binder in place, JP drain with serous output and abdominal incisions are intact with op site dressing over midline with no active drainage noted underneath Extremities: no c/c/e  Labs: CBC and Bmet ordered  Assessment: 58 y.o. s/p Procedures: LAPAROSCOPY, DIAGNOSTIC HYSTERECTOMY, TOTAL, ABDOMINAL, WITH BILATERAL SALPINGO-OOPHORECTOMY, WITH NEOPLASM DEBULKING, APPENDECTOMY, OMENECTOMY SUSPENSION, VAGINAL VAULT ANTERIOR (CYSTOCELE) AND POSTERIOR REPAIR (RECTOCELE) PERINEOPLASTY CREATION, PUBOVAGINAL SLING CYSTOSCOPY: stable  Pain:  Pain is well-controlled on PRN medications.  Heme:  Postop anemia. Hgb pending this am. Continue to monitor. JP drain coming from pelvis/abdomen with serous drainage.  CV: BP stable. Tachycardic-improved. CT angio chest performed on 12/19 to rule out PE given desaturation and tachycardia-negative.  GI:  Postop ileus. Awaiting return of bowel function. Abd xray performed on 01/18/24 resulting changes of a post-op ileus.   FEN: No significant metabolic derangements on previous labs. AM Bmet pending.  Prophylaxis: SCDs and lovenox .     Plan: AM labs ordered Dulcolax suppository ordered Diet sips of clears. Possibility of TPN if no flatus today and diet is unable to be advanced given ileus Encouraged increasing mobility, IS use Continue with current plan of care   LOS: 6 days    Miranda Hill 01/20/2024, 9:50 AM

## 2024-01-20 NOTE — Plan of Care (Signed)
   Problem: Activity: Goal: Risk for activity intolerance will decrease Outcome: Progressing   Problem: Nutrition: Goal: Adequate nutrition will be maintained Outcome: Progressing

## 2024-01-20 NOTE — Progress Notes (Signed)
" °   01/20/24 0000  BiPAP/CPAP/SIPAP  BiPAP/CPAP/SIPAP Pt Type Infant/Pediatric  Reason BIPAP/CPAP not in use Non-compliant    "

## 2024-01-21 ENCOUNTER — Encounter: Payer: Self-pay | Admitting: Hematology and Oncology

## 2024-01-21 ENCOUNTER — Other Ambulatory Visit (HOSPITAL_COMMUNITY): Payer: Self-pay

## 2024-01-21 ENCOUNTER — Inpatient Hospital Stay: Admitting: Gynecologic Oncology

## 2024-01-21 LAB — BASIC METABOLIC PANEL WITH GFR
Anion gap: 8 (ref 5–15)
BUN: <5 mg/dL — ABNORMAL LOW (ref 6–20)
CO2: 29 mmol/L (ref 22–32)
Calcium: 8.6 mg/dL — ABNORMAL LOW (ref 8.9–10.3)
Chloride: 106 mmol/L (ref 98–111)
Creatinine, Ser: 0.48 mg/dL (ref 0.44–1.00)
GFR, Estimated: >60 mL/min
Glucose, Bld: 134 mg/dL — ABNORMAL HIGH (ref 70–99)
Potassium: 3.4 mmol/L — ABNORMAL LOW (ref 3.5–5.1)
Sodium: 143 mmol/L (ref 135–145)

## 2024-01-21 LAB — CBC
HCT: 24.7 % — ABNORMAL LOW (ref 36.0–46.0)
Hemoglobin: 7.9 g/dL — ABNORMAL LOW (ref 12.0–15.0)
MCH: 29.9 pg (ref 26.0–34.0)
MCHC: 32 g/dL (ref 30.0–36.0)
MCV: 93.6 fL (ref 80.0–100.0)
Platelets: 144 K/uL — ABNORMAL LOW (ref 150–400)
RBC: 2.64 MIL/uL — ABNORMAL LOW (ref 3.87–5.11)
RDW: 16.7 % — ABNORMAL HIGH (ref 11.5–15.5)
WBC: 6.7 K/uL (ref 4.0–10.5)
nRBC: 0 % (ref 0.0–0.2)

## 2024-01-21 MED ORDER — ACETAMINOPHEN 325 MG PO TABS
650.0000 mg | ORAL_TABLET | Freq: Four times a day (QID) | ORAL | Status: DC
  Administered 2024-01-21 – 2024-01-22 (×3): 650 mg via ORAL
  Filled 2024-01-21 (×4): qty 2

## 2024-01-21 MED ORDER — BOOST / RESOURCE BREEZE PO LIQD CUSTOM
1.0000 | Freq: Three times a day (TID) | ORAL | Status: DC
  Administered 2024-01-21: 1 via ORAL

## 2024-01-21 MED ORDER — SENNA 8.6 MG PO TABS
1.0000 | ORAL_TABLET | Freq: Two times a day (BID) | ORAL | 1 refills | Status: AC | PRN
  Filled 2024-01-21: qty 25, 13d supply, fill #0

## 2024-01-21 MED ORDER — HYDROMORPHONE HCL 2 MG PO TABS
2.0000 mg | ORAL_TABLET | Freq: Four times a day (QID) | ORAL | 0 refills | Status: AC | PRN
  Filled 2024-01-21: qty 10, 3d supply, fill #0

## 2024-01-21 MED ORDER — APIXABAN 2.5 MG PO TABS
2.5000 mg | ORAL_TABLET | Freq: Two times a day (BID) | ORAL | 0 refills | Status: DC
  Filled 2024-01-21: qty 42, 21d supply, fill #0

## 2024-01-21 NOTE — Progress Notes (Addendum)
 7 Days Post-Op Procedures (LRB): LAPAROSCOPY, DIAGNOSTIC (N/A) HYSTERECTOMY, TOTAL, ABDOMINAL, WITH BILATERAL SALPINGO-OOPHORECTOMY, WITH NEOPLASM DEBULKING, APPENDECTOMY, OMENECTOMY (N/A) SUSPENSION, VAGINAL VAULT (N/A) ANTERIOR (CYSTOCELE) AND POSTERIOR REPAIR (RECTOCELE) (N/A) PERINEOPLASTY (N/A) CREATION, PUBOVAGINAL SLING (N/A) CYSTOSCOPY (N/A)  Subjective: Patient reports doing well, having more abdominal pain with movement. Denies nausea, still burping some. Tolerating liquids yesterday. Had 3 small bowel movements overnight with flatus at the time of bowel movements.   Objective: Vital signs in last 24 hours: Temp:  [98.2 F (36.8 C)-98.5 F (36.9 C)] 98.2 F (36.8 C) (12/24 0509) Pulse Rate:  [89-101] 89 (12/24 0509) Resp:  [15-16] 15 (12/24 0509) BP: (130-134)/(70-84) 130/74 (12/24 0509) SpO2:  [94 %-100 %] 96 % (12/24 0509) Weight:  [178 lb 5.6 oz (80.9 kg)] 178 lb 5.6 oz (80.9 kg) (12/24 0500) Last BM Date : 01/20/24  Intake/Output from previous day: 12/23 0701 - 12/24 0700 In: 1501.3 [P.O.:390; I.V.:1111.3] Out: 2190 [Urine:2150; Drains:40]  Physical Examination: General: alert, cooperative, and no distress Respiratory: clear to auscultation bilaterally Cardiovascular: regular rate and rhythm, no murmur, click, rub or gallop Abdomen: soft, mildly distended, appropriately tender to palpation, +BS. Incision clean, dry and intact. Honeycomb dressing removed. JP bulb with minimal serous fluid.  Extremities: warm and well perfused, no calf tenderness to palpation  Labs:    Latest Ref Rng & Units 01/21/2024    2:37 AM 01/20/2024   10:20 AM 01/19/2024    3:38 AM  CBC  WBC 4.0 - 10.5 K/uL 6.7  7.5  7.6   Hemoglobin 12.0 - 15.0 g/dL 7.9  8.2  7.3   Hematocrit 36.0 - 46.0 % 24.7  25.2  23.3   Platelets 150 - 400 K/uL 144  156  119       Latest Ref Rng & Units 01/21/2024    2:37 AM 01/20/2024   10:20 AM 01/19/2024    3:38 AM  BMP  Glucose 70 - 99 mg/dL 865   857  840   BUN 6 - 20 mg/dL <5  <5  7   Creatinine 0.44 - 1.00 mg/dL 9.51  9.49  9.53   Sodium 135 - 145 mmol/L 143  142  139   Potassium 3.5 - 5.1 mmol/L 3.4  3.4  3.4   Chloride 98 - 111 mmol/L 106  104  104   CO2 22 - 32 mmol/L 29  29  28    Calcium 8.9 - 10.3 mg/dL 8.6  9.1  8.4      Assessment:  58 y.o. s/p Procedures: LAPAROSCOPY, DIAGNOSTIC HYSTERECTOMY, TOTAL, ABDOMINAL, WITH BILATERAL SALPINGO-OOPHORECTOMY, WITH NEOPLASM DEBULKING, APPENDECTOMY, OMENECTOMY SUSPENSION, VAGINAL VAULT ANTERIOR (CYSTOCELE) AND POSTERIOR REPAIR (RECTOCELE) PERINEOPLASTY CREATION, PUBOVAGINAL SLING CYSTOSCOPY: progressing well, ileus resolving.  Pain/post-up:  Pain is well-controlled on PRN medications. Will need drain pulled prior to discharge.   Heme: Acute on chronic anemia secondary to blood loss. Hgb stable, asymptomatic. Continue to monitor. Oral iron once bowel function normalizes.   CV: BP stable. Tachycardia resolved (CTA with no evidence of pulmonary embolism) - suspected secondary to pain and anxiety.   GI:  Postop ileus resolving, patient now with bowel function. On normal diet. Will watch today to ensured continued bowel function.   FEN: Mild hypokalemia.    Prophylaxis: SCDs and lovenox . Plan for eliquis  on discharge.     Plan: Continue normal diet, encouraged ambulation The patient is to be discharged to home.   LOS: 7 days    Comer JONELLE Dollar 01/21/2024, 7:35 AM

## 2024-01-21 NOTE — Progress Notes (Signed)
 [facility]  Date of Visit: 12/02/2023  History of Present Illness: Ms. Miranda Hill is a 58 y.o. female POD#7  Surgery: s/p Exam under anesthesia, abdominal uterosacral ligament vaginal vault suspension, posterior repair, perineoplasty, and cystoscopy on 01/14/24 - Diagnostic laparoscopy, conversion to exploratory laparotomy with radical tumor debulking including total omentectomy, peritoneal stripping, excision of multiple small bowel mesenteric nodules, argon laser of peritoneal nodules, small bowel mesenteric nodules and rectal nodules; total hysterectomy with bilateral salpingo-oophorectomy; appendectomy by Dr. Viktoria  She passed her postoperative void trial POD#1, PVR on POD#2.    Today reports feeling better. Continues to report increased urinary frequency overnight attributed to IV fluids. Denies dysuria, hematuria and reports delay in care from nursing.  Ready to return home for crafts and Christmas, excited to be home with her Son visiting from [place]  Denies nausea or vomited overnight, tolerating PO liquids and Bm x 3.  Pain 5/10, discontinue use of abdominal binder while in bed Denies CP, SOB Ambulating with minimal assistance Minimal spotting and continues double voiding to ensure bladder emptying. Declined CPAP at night with discomfort   Pathology results:  A. 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  B. 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  C. 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  D. UTERUS, CERVIX, BILATERAL TUBES AND OVARIES: -  1 unoriented fallopian tube involved by high-grade serous carcinoma measuring 12 mm in greatest linear dimension. -  Unremarkable cervix, negative for dysplasia. -  Atrophic appearing endometrium with  adenomyosis. -  Overall unremarkable myometrium. -  Posterior reflection/serosa with involvement by the patient's known high-grade serous carcinoma. -  See synoptic report below for further information.  E. 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  G. 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  H. APPENDIX: -  Serosal involvement by the patient's known high-grade serous carcinoma  I. 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  Medications:   Current Facility-Administered Medications (Analgesics):    acetaminophen  (TYLENOL ) tablet 650 mg   HYDROmorphone  (DILAUDID ) injection 0.5-1 mg   HYDROmorphone  (DILAUDID ) tablet 2-4 mg  Current Outpatient Medications (Analgesics):    HYDROmorphone  (DILAUDID ) 2 MG tablet, Take 1 tablet (2 mg total) by mouth every 6 (six) hours as needed for up to 3 days for severe pain (pain score 7-10).  Current Facility-Administered Medications (Hematological):    enoxaparin  (LOVENOX ) injection 40 mg  Current Outpatient Medications (Hematological):    apixaban  (ELIQUIS ) 2.5 MG TABS tablet, Take 1 tablet (2.5 mg total) by mouth 2 (two) times daily.  Current Facility-Administered Medications (Other):    ALPRAZolam  (XANAX ) tablet 0.25 mg   bisacodyl  (DULCOLAX) suppository 10 mg   Chlorhexidine  Gluconate Cloth 2 % PADS 6 each   feeding supplement (BOOST / RESOURCE BREEZE) liquid 1 Container   feeding supplement (ENSURE PLUS HIGH PROTEIN) liquid 237 mL   lip balm (CARMEX) ointment   mirabegron  ER (MYRBETRIQ ) tablet 25 mg   ondansetron  (ZOFRAN ) tablet 4 mg **OR** ondansetron  (ZOFRAN ) injection 4 mg   pantoprazole  (PROTONIX ) EC tablet 40 mg   phenol (CHLORASEPTIC) mouth spray 1 spray    polyethylene glycol (MIRALAX  / GLYCOLAX )  packet 17 g   prochlorperazine  (COMPAZINE ) injection 5 mg   simethicone  (MYLICON) chewable tablet 80 mg   sodium chloride  flush (NS) 0.9 % injection 10-40 mL   sodium chloride  flush (NS) 0.9 % injection 10-40 mL  Current Outpatient Medications (Other):    senna (SENOKOT) 8.6 MG TABS tablet, Take 1 tablet (8.6 mg total) by mouth 2 (two) times daily as needed for mild constipation.   Allergies: Patient is allergic to tree extract, codeine, latex, oxycodone , tramadol, and paclitaxel .   Physical Exam: BP 137/76 (BP Location: Left Arm)   Pulse (!) 102   Temp 98.5 F (36.9 C) (Oral)   Resp 16   Ht 5' 2 (1.575 m)   Wt 80.9 kg   SpO2 96%   BMI 32.62 kg/m   Abdomen: soft, nondistended, tenderness mild in the LUQ and in the LLQ, and without guarding Drain in place.  Abdominal Incisions: healing well, no significant drainage, no dehiscence, honeycomb dressing in place with Tegaderm. Drain with clear yellow output Pelvic Examination: deferred,  minimal blood on pad  Lab Results  Component Value Date   WBC 6.7 01/21/2024   HGB 7.9 (L) 01/21/2024   HCT 24.7 (L) 01/21/2024   MCV 93.6 01/21/2024   PLT 144 (L) 01/21/2024   Lab Results  Component Value Date   CREATININE 0.48 01/21/2024    Intake/Output Summary (Last 24 hours) at 01/21/2024 1421 Last data filed at 01/21/2024 1400 Gross per 24 hour  Intake 1701.25 ml  Output 2410 ml  Net -708.75 ml   I/O last 3 completed shifts: In: 2591.3 [P.O.:390; I.V.:2201.3] Out: 3320 [Urine:3250; Drains:70]  EXAM: 1 VIEW XRAY OF THE ABDOMEN 01/18/2024 10:13:57 AM   COMPARISON: None available.   CLINICAL HISTORY: Ileus, postoperative (HCC).   FINDINGS:   LINES, TUBES AND DEVICES: Surgical drain overlies the left hemiabdomen terminating in the pelvis.   BOWEL: Gaseous distention of the stomach, a few scattered segments of small bowel, and throughout the colon. No abrupt transition point to  suggest obstruction.   SOFT TISSUES: No abnormal calcifications.   BONES: No acute fracture. Thoracolumbar osteophytosis.   IMPRESSION: 1. Gaseous distention of the stomach, scattered segments of small bowel, and throughout the colon, favored to represent changes of a postoperative ileus. Continued radiographic follow-up recommended.   Electronically signed by: Rogelia Myers MD 01/18/2024 12:32 PM EST RP Workstation: [id number]   ---------------------------------------------------------  Assessment and Plan:  1. Postoperative pain   2. Nocturia   3. Malignant neoplasm of ovary, unspecified laterality (HCC)   4. Preop testing   5. Mixed stress and urge urinary incontinence   6. Postoperative nausea   7. Gastroesophageal reflux disease without esophagitis   8. Situational anxiety   9. Obstructive sleep apnea syndrome   10. Gynecologic malignancy (HCC)    - Gen - pain 5/10 well controlled with PO meds due to recent postoperative ileus. Reports history of nausea with tramadol and oxycodone  use. Resume tylenol  and changed to scheduled q6hrs for multimodal pain control once tolerating PO.  - Cardio/pulm - tacyhcardia HR 102, continue anxiolytic PRN, denies CP/SOB, negative CTA 01/16/24. Encouraged stress management and resumption of crafts and ornament making for wellness.  Hgb trending down from 11 to 8.1 and stable at 7.9, continue to monitor if clinical change. - GI - prior X-ray consistent with ileus. Resolution of nausea and BM x 3 with history of PONV. Encouraged to continue ambulation. Encouraged chewing gum, denies GERD symptoms with Protonix  daily. NG removed on POD#1.  Continue nutritional supplements with diet advancement and simethicone  PRN gas pain. Advanced to regular diet - GU - UOP WNL. Cr WNL and stable at 0.48. Pass void trial on POD#1. H/o rectus fascial sling, prior sensation of incomplete emptying and leakage resolved with Trospium  preop, can resume after return of GI  function upon discharge. Continue to monitor for signs and symptoms of urinary retention, postvoid PVR 108 on POD#2. Continue to monitor spontaneous voids and double voiding to ensure bladder emptying. Repeat PVR if clinical change. Continue mirabegron  25mg  for nocturia while inpatient. Consider resuming Trospium  when she returns home if regular bowel movements. Avoid fluid intake 3 hours before bedtime - VTE - encouraged ambulation, continue lovenox  - Dispo - pending gyn onc recommendations and likely tomorrow if clinically stable  - Discussed avoidance of heavy lifting and straining long term to reduce the risk of recurrence.   All questions answered.

## 2024-01-21 NOTE — Plan of Care (Signed)
   Problem: Education: Goal: Knowledge of General Education information will improve Description Including pain rating scale, medication(s)/side effects and non-pharmacologic comfort measures Outcome: Progressing   Problem: Health Behavior/Discharge Planning: Goal: Ability to manage health-related needs will improve Outcome: Progressing

## 2024-01-21 NOTE — Plan of Care (Signed)

## 2024-01-22 LAB — BASIC METABOLIC PANEL WITH GFR
Anion gap: 8 (ref 5–15)
BUN: 7 mg/dL (ref 6–20)
CO2: 29 mmol/L (ref 22–32)
Calcium: 8.5 mg/dL — ABNORMAL LOW (ref 8.9–10.3)
Chloride: 104 mmol/L (ref 98–111)
Creatinine, Ser: 0.48 mg/dL (ref 0.44–1.00)
GFR, Estimated: >60 mL/min
Glucose, Bld: 126 mg/dL — ABNORMAL HIGH (ref 70–99)
Potassium: 3.3 mmol/L — ABNORMAL LOW (ref 3.5–5.1)
Sodium: 141 mmol/L (ref 135–145)

## 2024-01-22 LAB — CBC
HCT: 24.7 % — ABNORMAL LOW (ref 36.0–46.0)
Hemoglobin: 7.9 g/dL — ABNORMAL LOW (ref 12.0–15.0)
MCH: 30.2 pg (ref 26.0–34.0)
MCHC: 32 g/dL (ref 30.0–36.0)
MCV: 94.3 fL (ref 80.0–100.0)
Platelets: 146 K/uL — ABNORMAL LOW (ref 150–400)
RBC: 2.62 MIL/uL — ABNORMAL LOW (ref 3.87–5.11)
RDW: 16.8 % — ABNORMAL HIGH (ref 11.5–15.5)
WBC: 7 K/uL (ref 4.0–10.5)
nRBC: 0 % (ref 0.0–0.2)

## 2024-01-22 MED ORDER — POTASSIUM CHLORIDE CRYS ER 20 MEQ PO TBCR
20.0000 meq | EXTENDED_RELEASE_TABLET | Freq: Once | ORAL | Status: AC
  Administered 2024-01-22: 20 meq via ORAL
  Filled 2024-01-22: qty 1

## 2024-01-22 MED ORDER — HEPARIN SOD (PORK) LOCK FLUSH 100 UNIT/ML IV SOLN
500.0000 [IU] | INTRAVENOUS | Status: AC | PRN
  Administered 2024-01-22: 500 [IU]

## 2024-01-22 MED ORDER — POTASSIUM CHLORIDE CRYS ER 10 MEQ PO TBCR: 10.0000 meq | EXTENDED_RELEASE_TABLET | Freq: Two times a day (BID) | ORAL | 0 refills | Status: DC

## 2024-01-22 NOTE — Plan of Care (Signed)
   Problem: Education: Goal: Knowledge of General Education information will improve Description Including pain rating scale, medication(s)/side effects and non-pharmacologic comfort measures Outcome: Progressing   Problem: Health Behavior/Discharge Planning: Goal: Ability to manage health-related needs will improve Outcome: Progressing

## 2024-01-22 NOTE — Progress Notes (Signed)
 Patient was given discharge instructions, and all questions were answered.  Patient was stable for discharge and was taken to the main exit by wheelchair.

## 2024-01-22 NOTE — Discharge Summary (Signed)
 Physician Discharge Summary  Patient ID: Miranda Hill MRN: [id number] DOB/AGE: [birth year] 59 y.o.  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   The postoperative course was remarkable for tachycardia. Further evaluation included a CTA that was negative for a PE. Her HR downward-trended. She also had aggravation of her baseline pancytopenia; her hemoglobin was 7.9 on the day of discharge.  She remained hemodynamically stable. She developed an ileus that responded to supportive care and bowel rest. She was also mildly hypokalemic and the potassium was replaced orally.  She was discharged to home on postoperative day 8 tolerating a regular diet.   Consults: None  Significant Diagnostic Studies: See above  Treatments: IV hydration and surgery: see above  Discharge Exam: Blood pressure 128/82, pulse 95, temperature 98.5 F (36.9 C), temperature source Oral, resp. rate 17, height 5' 2 (1.575 m), weight 80.9 kg, SpO2 95%. General appearance: alert Resp: clear to auscultation bilaterally Cardio: regular rate and rhythm, S1, S2 normal, no murmur, click, rub or gallop GI: soft, non-tender; bowel sounds normal; no masses,  no organomegaly Extremities: extremities normal, atraumatic, no cyanosis or edema and Homans sign is negative, no sign of DVT Incision/Wound: C/D/I  Disposition: Discharge  disposition: 01-Home or Self Care       Discharge Instructions     Call MD for:  extreme fatigue   Complete by: As directed    Call MD for:  persistant dizziness or light-headedness   Complete by: As directed    Call MD for:  persistant nausea and vomiting   Complete by: As directed    Call MD for:  redness, tenderness, or signs of infection (pain, swelling, redness, odor or green/yellow discharge around incision site)   Complete by: As directed    Call MD for:  severe uncontrolled pain   Complete by: As directed    Call MD for:  temperature >100.4   Complete by: As directed    No dressing needed   Complete by: As directed       Allergies as of 01/22/2024       Reactions   Tree Extract Anaphylaxis, Hives, Swelling, Other (See Comments)   TREE NUTS, Nutmeg, pistachios, macadamias (patient somewhat disagrees with this in 09/2023)   Codeine Nausea And Vomiting   Latex Other (See Comments)   Latex Band-Aids = Caused skin irritation   Oxycodone  Nausea And Vomiting   Patient indicates she has vomiting with this medication.   Tramadol Nausea And Vomiting   Paclitaxel  Other (See Comments)   During 3rd 3-hr taxol , pt experienced headache and upper back pain. Rate decreased. Pt able to tolerate complete infusion after tylenol  650mg . See note 11/27/2023        Medication List     TAKE these medications    bisacodyl  5 MG EC tablet Commonly known as: DULCOLAX The day before surgery starting at 9:00 am, take 4 Dulcolax tablets with 2 glasses of clear liquid.   dexamethasone  4 MG tablet Commonly known as: DECADRON  Take 2 tabs by mouth at the night before and 2 tab the morning of  chemotherapy, every 3 weeks, by mouth x 6 cycles   Eliquis  2.5 MG Tabs tablet Generic drug: apixaban  Take 1 tablet (2.5 mg total) by mouth 2 (two) times daily.   HYDROmorphone  2 MG tablet Commonly known as: Dilaudid  Take 1 tablet (2 mg total) by mouth every 6 (six) hours as needed for up to 3  days for severe pain (pain score 7-10).   metFORMIN  500 MG tablet Commonly known as: GLUCOPHAGE  Take 1 tablet (500 mg total) by mouth 2 (two) times daily with a meal.   multivitamin with minerals Tabs tablet Take 1 tablet by mouth once a week. Life Extension Two Per Day Multivitamin   neomycin  500 MG tablet Commonly known as: MYCIFRADIN  The day before surgery, plan to take 2 tablets (1000 mg total) at 2pm, 3 pm, and 10 pm   omeprazole  40 MG capsule Commonly known as: PRILOSEC Take 1 capsule (40 mg total) by mouth daily before breakfast.   ondansetron  8 MG tablet Commonly known as: ZOFRAN  Take 1 tablet (8 mg total) by mouth every 8 (eight) hours as needed for nausea or vomiting.   ondansetron  4 MG tablet Commonly known as: ZOFRAN  Take 1 tablet (4 mg total) by mouth every 8 (eight) hours as needed for nausea or vomiting.   polyethylene glycol 17 g packet Commonly known as: MIRALAX  / GLYCOLAX  Take 17 g by mouth daily. What changed: Another medication with the same name was removed. Continue taking this medication, and follow the directions you see here.   polyethylene glycol powder 17 GM/SCOOP powder Commonly known as: MiraLax  The day before surgery at 11:00 am, mix whole bottle of Miralax  in 64 oz of Gatorade and drink one 8 oz glass every 15 minutes until gone. What changed: Another medication with the same name was removed. Continue taking this medication, and follow the directions you see here.   potassium chloride  10 MEQ tablet Commonly known as: KLOR-CON  M Take 1 tablet (10 mEq total) by mouth 2 (two) times daily. Start taking on: January 23, 2024   senna 8.6 MG Tabs tablet Commonly known as: SENOKOT Take 1 tablet by mouth daily. What changed: Another medication with the same name was added. Make sure you understand how and when to take each.   senna 8.6 MG Tabs tablet Commonly known as: SENOKOT Take 1 tablet (8.6 mg total) by mouth 2 (two) times daily as needed for  mild constipation. What changed: You were already taking a medication with the same name, and this prescription was added. Make sure you understand how and when to take each.   Trospium  Chloride 60 MG Cp24 Take 1 capsule (60 mg total) by mouth daily.               Discharge Care Instructions  (From admission, onward)           Start     Ordered   01/22/24 0000  No dressing needed        01/22/24 0930            Follow-up Information     Guadlupe Lianne DASEN, MD Follow up on 02/24/2024.   Specialties: Obstetrics, Gynecology Why: please return to the office in 1-2 days if you are discharged home with a foley catheter Contact information: [address] [place] [address] [phone]                 Signed: [place] 01/22/2024, 9:31 AM

## 2024-01-23 ENCOUNTER — Telehealth: Payer: Self-pay

## 2024-01-23 ENCOUNTER — Other Ambulatory Visit: Payer: Self-pay

## 2024-01-23 NOTE — Telephone Encounter (Signed)
 Spoke with Miranda Hill this morning. She states she is eating, drinking and urinating well. She has not had a BM since hospital discharge on 12/25 but is passing gas. She is taking senokot as prescribed and encouraged her to drink plenty of water . She denies fever or chills. Incisions are dry and intact. She rates her pain 5-6/10. Her pain is controlled with Tylenol , Dilaudid  if Tylenol  does not help.     Instructed to call office with any fever, chills, purulent drainage, uncontrolled pain or any other questions or concerns. Patient verbalizes understanding.   Pt aware of post op appointments as well as the office number [phone] and after hours number [phone] to call if she has any questions or concerns

## 2024-01-27 ENCOUNTER — Other Ambulatory Visit: Payer: Self-pay | Admitting: Hematology and Oncology

## 2024-01-27 ENCOUNTER — Telehealth: Payer: Self-pay | Admitting: Oncology

## 2024-01-27 DIAGNOSIS — C569 Malignant neoplasm of unspecified ovary: Secondary | ICD-10-CM

## 2024-01-27 NOTE — Telephone Encounter (Signed)
 Called Miranda Hill back and let her know to restart the Metformin  per Dr. Lonn.  She verbalized understanding and agreement and will start it tonight with dinner.

## 2024-01-27 NOTE — Telephone Encounter (Signed)
 Called Miranda Hill and let her know that Dr. Lonn is recommending a minium of 1 month recovery from surgery.  Asked if she would like to restart chemotherapy on 02/13/24 and she is good with that date.  Advised a scheduler will call her with the appointment time.  Miranda Hill also said that she has not been taking Metformin  or her overactive bladder medication since surgery.  She is wondering when to restart them.  Advised I will ask Dr. Lonn about the Metformin  and that she may need to ask Dr. Guadlupe at her post op appointment about the overactive bladder medication.

## 2024-01-30 ENCOUNTER — Telehealth: Payer: Self-pay

## 2024-01-30 NOTE — Telephone Encounter (Signed)
 Called her back and given below message. She verbalized understanding and will keep feet warm. She will call the office back if she wants to try Gabapentin .

## 2024-01-30 NOTE — Telephone Encounter (Signed)
 That is caused by chemo At home, she should keep her feet warm We can prescribe gabapentin , there are side-effects to consider such as constipation and sedation

## 2024-01-30 NOTE — Telephone Encounter (Signed)
 Returned her call. She is complaining numbness to toes and neuropathy symptoms. Symptoms are mainly at night. She has tried applying ice to her feet and wears socks at night.  She is asking if Dr. Lonn has any suggestions.

## 2024-01-31 ENCOUNTER — Other Ambulatory Visit: Payer: Self-pay | Admitting: Hematology and Oncology

## 2024-01-31 ENCOUNTER — Encounter: Payer: Self-pay | Admitting: Hematology and Oncology

## 2024-01-31 ENCOUNTER — Other Ambulatory Visit (HOSPITAL_COMMUNITY): Payer: Self-pay

## 2024-02-02 ENCOUNTER — Other Ambulatory Visit (HOSPITAL_COMMUNITY): Payer: Self-pay

## 2024-02-02 ENCOUNTER — Encounter: Payer: Self-pay | Admitting: Hematology and Oncology

## 2024-02-02 MED ORDER — METFORMIN HCL 500 MG PO TABS
500.0000 mg | ORAL_TABLET | Freq: Two times a day (BID) | ORAL | 1 refills | Status: AC
  Filled 2024-02-02: qty 60, 30d supply, fill #0
  Filled 2024-03-02: qty 60, 30d supply, fill #1

## 2024-02-03 ENCOUNTER — Telehealth (HOSPITAL_COMMUNITY): Payer: Self-pay

## 2024-02-03 ENCOUNTER — Other Ambulatory Visit (HOSPITAL_COMMUNITY): Payer: Self-pay

## 2024-02-03 NOTE — Telephone Encounter (Signed)
 Pharmacy Patient Advocate Encounter   Received notification from Pt Calls Messages that prior authorization for Trospium  Chloride ER 60MG  er capsules  is required/requested.   Insurance verification completed.   The patient is insured through [employer].   Per test claim: PA required; PA submitted to above mentioned insurance via Latent Key/confirmation #/EOC [id number] Status is pending

## 2024-02-04 ENCOUNTER — Other Ambulatory Visit (HOSPITAL_COMMUNITY): Payer: Self-pay

## 2024-02-04 ENCOUNTER — Encounter: Payer: Self-pay | Admitting: Hematology and Oncology

## 2024-02-04 NOTE — Telephone Encounter (Signed)
 Pharmacy Patient Advocate Encounter  Received notification from [employer] that Prior Authorization for  Trospium  Chloride ER 60MG  er capsules  has been DENIED.  See denial reason below. No denial letter attached in CMM. Will attach denial letter to Media tab once received.   PA #/Case ID/Reference #: [id number]

## 2024-02-06 ENCOUNTER — Telehealth: Payer: Self-pay | Admitting: Obstetrics

## 2024-02-06 ENCOUNTER — Encounter: Payer: Self-pay | Admitting: Hematology and Oncology

## 2024-02-06 ENCOUNTER — Encounter: Payer: Self-pay | Admitting: Gynecologic Oncology

## 2024-02-06 ENCOUNTER — Inpatient Hospital Stay: Attending: Hematology and Oncology | Admitting: Gynecologic Oncology

## 2024-02-06 ENCOUNTER — Other Ambulatory Visit: Payer: Self-pay | Admitting: Obstetrics

## 2024-02-06 ENCOUNTER — Other Ambulatory Visit (HOSPITAL_COMMUNITY): Payer: Self-pay

## 2024-02-06 VITALS — BP 133/83 | HR 97 | Temp 98.9°F | Resp 19 | Wt 167.0 lb

## 2024-02-06 DIAGNOSIS — C569 Malignant neoplasm of unspecified ovary: Secondary | ICD-10-CM

## 2024-02-06 DIAGNOSIS — C563 Malignant neoplasm of bilateral ovaries: Secondary | ICD-10-CM | POA: Insufficient documentation

## 2024-02-06 DIAGNOSIS — N3946 Mixed incontinence: Secondary | ICD-10-CM

## 2024-02-06 DIAGNOSIS — Z5111 Encounter for antineoplastic chemotherapy: Secondary | ICD-10-CM | POA: Insufficient documentation

## 2024-02-06 DIAGNOSIS — C786 Secondary malignant neoplasm of retroperitoneum and peritoneum: Secondary | ICD-10-CM | POA: Insufficient documentation

## 2024-02-06 DIAGNOSIS — Z90710 Acquired absence of both cervix and uterus: Secondary | ICD-10-CM

## 2024-02-06 DIAGNOSIS — Z9079 Acquired absence of other genital organ(s): Secondary | ICD-10-CM

## 2024-02-06 DIAGNOSIS — Z7189 Other specified counseling: Secondary | ICD-10-CM

## 2024-02-06 DIAGNOSIS — Z90722 Acquired absence of ovaries, bilateral: Secondary | ICD-10-CM

## 2024-02-06 MED ORDER — TROSPIUM CHLORIDE ER 60 MG PO CP24: 1.0000 | ORAL_CAPSULE | Freq: Every day | ORAL | 2 refills | Status: DC

## 2024-02-06 NOTE — Telephone Encounter (Signed)
 Pt showed up at pharmacy to pick up Trospium .  Pharmacy told her it was denied.  According to note in chart, looks like PA was denied.  Told her someone would call her back to discuss an alternative.

## 2024-02-06 NOTE — Progress Notes (Signed)
 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 Request for therapeutic paracentesis. EXAM: ULTRASOUND GUIDED THERAPEUTIC PARACENTESIS MEDICATIONS: 6 mL 1% lidocaine  COMPLICATIONS: None immediate. PROCEDURE: Informed written consent was obtained from the patient after a discussion of the risks, benefits and alternatives to treatment. A timeout was performed prior to the initiation of the procedure. Initial ultrasound scanning demonstrates a large amount of ascites within the right lower abdominal quadrant. The right lower abdomen was prepped and draped in the usual sterile fashion. 1% lidocaine  was used for local anesthesia. Following this, a 19 gauge, 7-cm, Yueh catheter was introduced. An ultrasound image was saved for documentation purposes. The paracentesis was performed. The catheter was removed and a dressing was applied. The patient tolerated the procedure well without immediate post procedural complication. FINDINGS: A total of approximately 3.2 L of clear, amber fluid was removed. IMPRESSION: Successful ultrasound-guided paracentesis yielding 3.2 liters of peritoneal fluid. Performed by Clotilda Hesselbach, PA-C Electronically Signed   By: Ester Sides M.D.   On: 10/01/2023 12:46   NM PET Image Initial (PI) Skull Base To Thigh (F-18 FDG) Result Date: 09/25/2023 CLINICAL DATA:  Initial treatment strategy for omental and peritoneal nodularity compatible with carcinomatosis. EXAM: NUCLEAR MEDICINE PET SKULL BASE TO THIGH TECHNIQUE: 9.4 mCi F-18 FDG was injected intravenously. Full-ring PET imaging  was performed from the skull base to thigh after the radiotracer. CT data was obtained and used for attenuation correction and anatomic localization. Fasting blood glucose: 116 mg/dl COMPARISON:  CT scan 1/74/7974 FINDINGS: Mediastinal blood pool activity: SUV max 2.4 Liver activity: SUV max NA NECK: No significant abnormal hypermetabolic activity in this region. Incidental CT findings: None. CHEST: Small type 1 hiatal hernia, accompanied by a small amount of ascites, faint metabolic activity in the ascites with maximum SUV 2.3, cannot exclude malignant ascites extension up through the hiatus. Incidental CT findings: Mild scarring or subsegmental atelectasis anteriorly in the right upper lobe. ABDOMEN/PELVIS: Heavy burden of hypermetabolic omental caking with malignant ascites and numerous foci of hypermetabolic activity along the liver capsule, right paracolic gutter, left paracolic gutter, mesentery, and pelvic ascites. A representative region of the omental caking in the left lower quadrant anterior to the descending colon a maximum SUV of 14.6. A hypermetabolic tumor deposit in the pelvic ascites anterior to the rectum on image 171 series 4 has maximum SUV of 16.0 and measures 2.7 cm in long axis. Hypermetabolic focus along the posterosuperior liver margin maximum SUV 7.8. Isolation of primary site is problematic given the tumor deposits along the cecum and expected location of the appendix as well as the adnexa. Overall moderate amount of malignant ascites. Incidental CT findings: None. SKELETON: No significant abnormal hypermetabolic activity in this region. Incidental CT findings: None. IMPRESSION: 1. Heavy burden of hypermetabolic omental caking with malignant ascites and numerous foci of hypermetabolic activity along the liver capsule, right paracolic gutter, left paracolic gutter, mesentery, and pelvic ascites. Isolation of primary site is problematic given the tumor deposits along the cecum and expected  location of the appendix as well as the adnexa. Overall moderate amount of malignant ascites. 2. Small type 1 hiatal hernia, accompanied by a  small amount of ascites, faint metabolic activity in the ascites with maximum SUV 2.3, cannot exclude malignant ascites extension up through the hiatus. Electronically Signed   By: Ryan Salvage M.D.   On: 09/25/2023 16:59   US  Paracentesis Result Date: 09/24/2023 INDICATION: other ascites Patient with history of bloating, abdominal distension, imaging findings of extensive omental and peritoneal nodularity, ascites; request received for diagnostic and therapeutic paracentesis. EXAM: ULTRASOUND GUIDED DIAGNOSTIC AND THERAPEUTIC PARACENTESIS MEDICATIONS: 8 mL 1% lidocaine  with epinephrine  COMPLICATIONS: None immediate. PROCEDURE: Informed written consent was obtained from the patient after a discussion of the risks, benefits and alternatives to treatment. A timeout was performed prior to the initiation of the procedure. Initial ultrasound scanning demonstrates a large amount of ascites within the LEFT lower abdominal quadrant. The left lower abdomen was prepped and draped in the usual sterile fashion. 1% lidocaine  was used for local anesthesia. Following this, a 6 Fr Safe-T-Centesis catheter was introduced. An ultrasound image was saved for documentation purposes. The paracentesis was performed. The catheter was removed and a dressing was applied. The patient tolerated the procedure well without immediate post procedural complication. FINDINGS: A total of approximately 4.7 L of hazy, blood-tinged fluid was removed. Samples were sent to the laboratory as requested by the clinical team. IMPRESSION: Successful ultrasound-guided diagnostic and therapeutic paracentesis yielding 4.7 L of peritoneal fluid. Performed by: Franky Rakers, PA-C Electronically Signed   By: Thom Hall M.D.   On: 09/24/2023 17:10   CT ABDOMEN PELVIS W CONTRAST Result Date: 09/22/2023 CLINICAL  DATA:  Bloating.  Distended abdomen. * Tracking Code: BO * EXAM: CT ABDOMEN AND PELVIS WITH CONTRAST TECHNIQUE: Multidetector CT imaging of the abdomen and pelvis was performed using the standard protocol following bolus administration of intravenous contrast. RADIATION DOSE REDUCTION: This exam was performed according to the departmental dose-optimization program which includes automated exposure control, adjustment of the mA and/or kV according to patient size and/or use of iterative reconstruction technique. CONTRAST:  100mL ISOVUE -300 IOPAMIDOL  (ISOVUE -300) INJECTION 61% COMPARISON:  None Available. FINDINGS: Lower chest: Small focus of atelectasis in the inferior lingula. No pulmonary nodules. Hepatobiliary: No focal hepatic lesion. Low-density gallstone noted. No sickle size. Pancreas: Pancreas is normal. No ductal dilatation. No pancreatic inflammation. Spleen: Normal spleen Adrenals/urinary tract: Adrenal glands and kidneys are normal. The ureters and bladder normal. Stomach/Bowel: Stomach is normal. The small bowel is floating on moderate volume intraperitoneal free fluid within the small bowel mesentery. No bowel obstruction. Terminal ileum is normal. Appendix not clearly identified. The colon is normal. Contrast travels the entirety of the colon to the rectum. Vascular/Lymphatic: Abdominal aorta is normal caliber. No periportal or retroperitoneal adenopathy. No pelvic adenopathy. Reproductive: Uterus and ovaries are grossly normal. Other: Moderate volume intraperitoneal free fluid. There is extensive nodularity the greater omentum (omental caking, image 58/2 for example). There are peritoneal nodules noted. For example nodule adjacent the RIGHT hepatic lobe measuring 13 mm image 35/2. Peritoneal nodularity in the posterior cul-de-sac on image 84/2. Musculoskeletal: No aggressive osseous lesion. IMPRESSION: 1. 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 2. Moderate volume  intraperitoneal free fluid. 3. No clear ovarian or appendiceal neoplasm. No clear primary malignancy identified. These results will be called to the ordering clinician or representative by the Radiologist Assistant, and communication documented in the PACS or [employer]. Electronically Signed   By: Jackquline Boxer M.D.   On: 09/22/2023 10:36      09/25/2023 Pathology Results   Pathology from malignant ascites came back positive  for malignancy but insufficient cellularity for further characterization   10/03/2023 Initial Diagnosis   Carcinomatosis peritonei (HCC)   10/04/2023 Tumor Marker   Patient's tumor was tested for the following markers: CA-125. Results of the tumor marker test revealed 305.   10/08/2023 Procedure   Successful ultrasound-guided diagnostic and therapeutic paracentesis yielding 3.1 liters of peritoneal fluid.   10/08/2023 Pathology Results   CYTOLOGY - NON PAP  CASE: [id number]  PATIENT: Miranda Hill  Non-Gynecological Cytology Report      Clinical History: None provided  Specimen Submitted:  A. 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 PERITONEAL, LLQ, BIOPSY:  - Metastatic carcinoma, consistent with high-grade serous carcinoma (see comment).   Comment: Immunohistochemical stains are performed.  The tumor cells stain positive for WT1 and PAX8 (patchy), with overexpression of p53. Calretinin is negative.  The combined immunomorphologic features are consistent with high-grade serous carcinoma.    10/10/2023 Cancer Staging   Staging form: Ovary, Fallopian Tube, and Primary Peritoneal Carcinoma, AJCC 8th Edition - Clinical: FIGO Stage IIIC (cT3c,  cN0, cM0) - Signed by Lonn Hicks, MD on 10/10/2023 Stage prefix: Initial diagnosis   10/14/2023 Procedure   Successful ultrasound-guided therapeutic paracentesis yielding 4.4 L liters of peritoneal fluid   10/16/2023 -  Chemotherapy   Patient is on Treatment Plan : OVARIAN Carboplatin  (AUC 6) + Paclitaxel  (175) q21d X 6 Cycles     10/16/2023 Tumor Marker   Patient's tumor was tested for the following markers: CA-125. Results of the tumor marker test revealed 164.   10/23/2023 Procedure   Successful ultrasound-guided therapeutic paracentesis yielding 1.3 liters of peritoneal fluid.   10/24/2023 Tumor Marker   Patient's tumor was tested for the following markers: CA-125. Results of the tumor marker test revealed 163.   11/07/2023 Tumor Marker   Patient's tumor was tested for the following markers: CA-125. Results of the tumor marker test revealed 185.   11/22/2023 Genetic Testing   Negative Common Hereditary Cancers panel +RNA, VUS in CHEK2, c.1270T>C (p.Tyr424His). The [employer] Common Hereditary Cancers panel includes analysis of the following 48 genes: APC, ATM, AXIN2, BAP1, BARD1, BMPR1A, BRCA1, BRCA2, BRIP1, CDH1, CDK4, CDKN2A, CHEK2, CTNNA1, DICER1, EPCAM, FH, GREM1, HOXB13, KIT, MBD4, MEN1, MLH1, MSH2, MSH3, MSH6, MUTYH, NF1, NTHL1, PALB2, PDGFRA, PMS2, POLD1, POLE, PTEN, RAD51C, RAD51D, SDHA, SDHB, SDHC, SDHD, SMAD4, SMARCA4, STK11, TP53, TSC1, TSC2, VHL. Report date 11/22/23.    12/05/2023 Imaging   CT CHEST ABDOMEN PELVIS W CONTRAST Result Date: 12/08/2023 EXAM: CT CHEST, ABDOMEN AND PELVIS WITH CONTRAST 12/05/2023 02:39:41 PM TECHNIQUE: CT of the chest, abdomen and pelvis was performed with the administration of intravenous contrast. Multiplanar reformatted images are provided for review. Automated exposure control, iterative reconstruction, and/or weight based adjustment of the mA/kV was utilized to reduce the radiation dose to as low as reasonably achievable. CONTRAST: 100 mL Iohexol   300. COMPARISON: PET CT dated 09/25/2023. CLINICAL HISTORY: Staging ovarian cancer assess response to chemo. FINDINGS: CHEST: MEDIASTINUM AND LYMPH NODES: Right chest port terminates in the mid SVC. Heart and pericardium are unremarkable. The central airways are clear. No mediastinal, hilar or axillary lymphadenopathy. LUNGS AND PLEURA: No focal consolidation or pulmonary edema. No pleural effusion or pneumothorax. ABDOMEN AND PELVIS: LIVER:  The liver is unremarkable. GALLBLADDER AND BILE DUCTS: Cholelithiasis, without associated inflammatory changes. No biliary ductal dilatation. SPLEEN: No acute abnormality. PANCREAS: No acute abnormality. ADRENAL GLANDS: No acute abnormality. KIDNEYS, URETERS AND BLADDER: No stones in the kidneys or ureters. No hydronephrosis. No perinephric or periureteral stranding. Urinary bladder is unremarkable. GI AND BOWEL: Stomach demonstrates no acute abnormality. There is no bowel obstruction. Moderate colonic stool burden, suggesting mild constipation. REPRODUCTIVE ORGANS: Uterus is within normal limits. Bilateral ovaries are unremarkable. PERITONEUM AND RETROPERITONEUM: Omental caking peritoneal disease beneath the anterior abdominal wall (image 85), improved. Prior moderate abdominopelvic ascites has resolved. No free air. VASCULATURE: Aorta is normal in caliber. ABDOMINAL AND PELVIS LYMPH NODES: No lymphadenopathy. BONES AND SOFT TISSUES: Mild degenerative changes of the visualized thoracolumbar spine. No acute osseous abnormality. No focal soft tissue abnormality. IMPRESSION: 1. Improved omental caking/peritoneal disease, compatible with partial treatment response. 2. Resolution of prior moderate abdominopelvic ascites. 3. No metastatic disease in the chest. Electronically signed by: Pinkie Pebbles MD 12/08/2023 07:54 PM EST RP Workstation: [id number]      12/12/2023 Tumor Marker   Patient's tumor was tested for the following markers: CA-125. Results of the tumor marker  test revealed 36.7.   01/14/2024 Surgery   Pre-operative Diagnosis: stage IIIC ovarian cancer now s/p 4C of NACT   Post-operative Diagnosis: same   Operation: Diagnostic laparoscopy, conversion to exploratory laparotomy with radical tumor debulking including total omentectomy, peritoneal stripping, excision of multiple small bowel mesenteric nodules, argon laser of peritoneal nodules, small bowel mesenteric nodules and rectal nodules; total hysterectomy with bilateral salpingo-oophorectomy; appendectomy R1 resection at the end of surgery.   Surgeon: Viktoria Crank MD    Operative Findings: On diagnostic laparoscopy, there was minimal miliary disease involving the right diaphragm.  Liver surface and stomach smooth.  Omentum with a large cake in the central abdomen and adherent to the left mid abdominal wall.  Within the pelvis, some nodules measuring up to approximately 1 cm noted within the cul-de-sac and on the mesorectal peritoneum.  Atrophic and normal-appearing uterus, bilateral tubes and ovaries.  Some miliary disease along pelvic peritoneum and bladder peritoneum.  Upon laparotomy, large omental cake noted measuring 10-15 cm.  Tumor quite adherent to the transverse colon mesentery with almost complete obliteration of the lesser sac and some areas. There was no disease involving the spleen.  A small, 2 cm splenic nodule noted just inferior to the spleen.  Some thickened omentum was noted medial to the spleen but the hilum itself was spared. Miliary disease noted on small bowel mesentery with most nodules measuring between 1 and 4 mm.  Larger nodules resected.  Many of the smaller nodules ablated with the argon beam.  R1 resection.   01/14/2024 Pathology Results   SURGICAL PATHOLOGY  CASE: [id number]  PATIENT: Miranda Hill  Surgical Pathology Report   Clinical History: ovarian cancer, uterovaginal prolapse unspecified, mixed incontinence, nocturia and history of mid urethral sling    FINAL MICROSCOPIC DIAGNOSIS:   A. OMENTUM:  -  Adipose tissue/omentum with extensive involvement by the patient's known high-grade serous carcinoma (definitive treatment effect not identified).   B. LEFT PELVIC PERITONEUM:  -  Involvement by the patient's known high-grade serous carcinoma.   C. 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  D. UTERUS, CERVIX, BILATERAL TUBES AND OVARIES: -  1 unoriented fallopian tube involved by high-grade serous carcinoma measuring 12 mm in greatest linear dimension. -  Unremarkable cervix, negative for dysplasia. -  Atrophic appearing endometrium with adenomyosis. -  Overall unremarkable myometrium. -  Posterior reflection/serosa with involvement by the patient's known high-grade serous carcinoma. -  See synoptic report below for further information.  E. 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  G. 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   H. APPENDIX: -  Serosal involvement by the patient's known high-grade serous carcinoma  I. 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  ONCOLOGY TABLE:  OVARY or FALLOPIAN TUBE or PRIMARY PERITONEUM: Resection  Procedure: Exploratory laparotomy with radical tumor debulking with total omentectomy, peritoneal stripping, excision of peritoneal, mesenteric, rectal and small bowel nodules with total hysterectomy and bilateral salpingo-oophorectomy with appendectomy Specimen Integrity: Intact Tumor Site: Main tumor site one of the fallopian tubes (adnexa received detached without orientation) Tumor Size: 12 mm in greatest linear dimension Histologic Type: High-grade serous carcinoma Histologic Grade:  High-grade Ovarian Surface Involvement: Not identified Fallopian Tube Surface Involvement: Present Implants: Omentum, left pelvic peritoneum, bladder peritoneum, uterine serosa, posterior cul-de-sac, left pelvic sidewall, small bowel mesenteric nodule, appendiceal serosa, left abdominal sidewall Lymphatic and/or Vascular Invasion: Not identified Other Tissue/ Organ Involvement: N/A Largest Extrapelvic Peritoneal Focus: Mesenteric masses at least 20 mm in greatest dimension Peritoneal/Ascitic Fluid Involvement: Positive, see previous cytology Chemotherapy Response Score (CRS): Status post 4 cycles of neoadjuvant chemotherapy with a CRS score of 1 (minimal/no response). Regional Lymph Nodes: N/A; no lymph nodes submitted or found      Number of Nodes with Metastasis Greater than 10 mm: N/A      Number of Nodes with Metastasis 10 mm or Less (excludes isolated tumor cells): N/A      Number of Nodes with Isolated Tumor Cells (0.2 mm or less): N/A      Number of Lymph Nodes Examined: 0 Distant Metastasis:      Distant Site(s) Involved: N/A Pathologic Stage Classification (pTNM, AJCC 8th Edition): pT3c, pNX; FIGO stage IIIC Ancillary Studies: Can be performed upon request Representative Tumor Block: D6 Comment(s): None (v1.3.0.1)       Interval History: Overall doing well.  Still has some intermittent abdominal pain.  This seems to be mostly in her upper abdomen.  Sometimes feels some tightness or distention depending on what she has eaten.  Last night, had chicken, rice, and broccoli and some cramping after.  Had 2 bowel movements this morning.  She is using Senokot and MiraLAX  together daily with good soft bowel movements.  Denies any recent vaginal bleeding, had some light spotting last week.  Denies any urinary symptoms.  Past Medical/Surgical History: 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   GERD (gastroesophageal reflux disease)     History of chlamydia    1990   History of hiatal hernia    Multiple thyroid  nodules    Multiple thyroid  nodules    Neuropathy    feet   PONV (postoperative nausea and vomiting)    Pre-diabetes    Prediabetes    Sleep apnea    uses cpap    Past Surgical History:  Procedure Laterality Date   ANTERIOR AND POSTERIOR REPAIR N/A 01/14/2024   Procedure: ANTERIOR (CYSTOCELE) AND POSTERIOR REPAIR (RECTOCELE);  Surgeon: Guadlupe Lianne DASEN, MD;  Location: [facility];  Service: Gynecology;  Laterality: N/A;  CYSTOSCOPY N/A 01/14/2024   Procedure: CYSTOSCOPY;  Surgeon: Guadlupe Lianne DASEN, MD;  Location: [facility];  Service: Gynecology;  Laterality: N/A;  cystourethroscopy possible urethrolysis   HYSTERECTOMY,ABD,W/BILAT SALPINGO-OOPHORECT,W/NEOPLASM DEBUL N/A 01/14/2024   Procedure: HYSTERECTOMY, TOTAL, ABDOMINAL, WITH BILATERAL SALPINGO-OOPHORECTOMY, WITH NEOPLASM DEBULKING, APPENDECTOMY, OMENECTOMY;  Surgeon: Viktoria Comer SAUNDERS, MD;  Location: [facility];  Service: Gynecology;  Laterality: N/A;   INTRAUTERINE DEVICE INSERTION  07/18/2008   MIRENA   IR IMAGING GUIDED PORT INSERTION  10/17/2023   IR PARACENTESIS  10/01/2023   IR PARACENTESIS  10/06/2023   IR PARACENTESIS  10/17/2023   IR PARACENTESIS  10/23/2023   LAPAROSCOPY N/A 01/14/2024   Procedure: LAPAROSCOPY, DIAGNOSTIC;  Surgeon: Viktoria Comer SAUNDERS, MD;  Location: [facility];  Service: Gynecology;  Laterality: N/A;   lymph nodes removed in neck      15 years ago   PERINEOPLASTY N/A 01/14/2024   Procedure: PERINEOPLASTY;  Surgeon: Guadlupe Lianne DASEN, MD;  Location: [facility];  Service: Gynecology;  Laterality: N/A;  perineorrhaphy   PUBOVAGINAL SLING N/A 01/14/2024   Procedure: CREATION, PUBOVAGINAL SLING;  Surgeon: Guadlupe Lianne DASEN, MD;  Location: [facility];  Service: Gynecology;  Laterality: N/A;   SALIVARY GLAND SURGERY     ABSCESS   TONSILLECTOMY AND ADENOIDECTOMY     URETHRAL SLING  01/28/1997   [facility]   VAGINAL PROLAPSE REPAIR N/A 01/14/2024   Procedure: SUSPENSION,  VAGINAL VAULT;  Surgeon: Guadlupe Lianne DASEN, MD;  Location: [facility];  Service: Gynecology;  Laterality: N/A;    Family History  Problem Relation Age of Onset   Colon cancer Mother 52   Hypertension Mother    Diabetes Mother    Hypertension Father    Diabetes Father    Lung cancer Father 16 - 41       brain mets   Other Sister        hypoglycemic   Colon polyps Sister    Depression Brother    Skin cancer Maternal Uncle    Breast cancer Paternal Aunt        dx >50   Uterine cancer Maternal Grandmother        dx>50 y.o.   Autism spectrum disorder Son     Social History   Socioeconomic History   Marital status: Married    Spouse name: Not on file   Number of children: Not on file   Years of education: Not on file   Highest education level: Associate degree: academic program  Occupational History   Not on file  Tobacco Use   Smoking status: Never   Smokeless tobacco: Never  Vaping Use   Vaping status: Never Used  Substance and Sexual Activity   Alcohol use: Not Currently   Drug use: No   Sexual activity: Yes    Birth control/protection: Post-menopausal    Comment: husband with vasectomy  Other Topics Concern   Not on file  Social History Narrative   Not on file   Social Drivers of Health   Tobacco Use: Low Risk (02/06/2024)   Patient History    Smoking Tobacco Use: Never    Smokeless Tobacco Use: Never    Passive Exposure: Not on file  Financial Resource Strain: Medium Risk (12/29/2023)   Overall Financial Resource Strain (CARDIA)    Difficulty of Paying Living Expenses: Somewhat hard  Food Insecurity: No Food Insecurity (01/14/2024)   Epic    Worried About Running Out of Food in the Last Year: Never true    Ran  Out of Food in the Last Year: Never true  Transportation Needs: No Transportation Needs (01/14/2024)   Epic    Lack of Transportation (Medical): No    Lack of Transportation (Non-Medical): No  Physical Activity: Insufficiently Active (12/29/2023)   Exercise  Vital Sign    Days of Exercise per Week: 2 days    Minutes of Exercise per Session: 30 min  Stress: No Stress Concern Present (12/29/2023)   [employer] of Occupational Health - Occupational Stress Questionnaire    Feeling of Stress: Only a little  Social Connections: Socially Integrated (01/14/2024)   Social Connection and Isolation Panel    Frequency of Communication with Friends and Family: Three times a week    Frequency of Social Gatherings with Friends and Family: Once a week    Attends Religious Services: More than 4 times per year    Active Member of Clubs or Organizations: No    Attends Banker Meetings: 1 to 4 times per year    Marital Status: Married  Depression (PHQ2-9): Low Risk (01/02/2024)   Depression (PHQ2-9)    PHQ-2 Score: 0  Alcohol Screen: Not on file  Housing: Low Risk (01/14/2024)   Epic    Unable to Pay for Housing in the Last Year: No    Number of Times Moved in the Last Year: 0    Homeless in the Last Year: No  Utilities: Not At Risk (01/14/2024)   Epic    Threatened with loss of utilities: No  Health Literacy: Not on file    Current Medications: Current Medications[1]  Review of Systems: + abdominal pain Denies appetite changes, fevers, chills, fatigue, unexplained weight changes. Denies hearing loss, neck lumps or masses, mouth sores, ringing in ears or voice changes. Denies cough or wheezing.  Denies shortness of breath. Denies chest pain or palpitations. Denies leg swelling. Denies abdominal distention, blood in stools, constipation, diarrhea, nausea, vomiting, or early satiety. Denies pain with intercourse, dysuria, frequency, hematuria or incontinence. Denies hot flashes, pelvic pain, vaginal bleeding or vaginal discharge.   Denies joint pain, back pain or muscle pain/cramps. Denies itching, rash, or wounds. Denies dizziness, headaches, numbness or seizures. Denies swollen lymph nodes or glands, denies easy bruising or  bleeding. Denies anxiety, depression, confusion, or decreased concentration.  Physical Exam: BP 133/83 (BP Location: Left Arm, Patient Position: Sitting)   Pulse 97   Temp 98.9 F (37.2 C) (Oral)   Resp 19   Wt 167 lb (75.8 kg)   SpO2 100%   BMI 30.54 kg/m  General: Alert, oriented, no acute distress. HEENT: Posterior oropharynx clear, sclera anicteric. Chest: Unlabored breathing on room air. Abdomen: soft, nontender, mildly distended.  Normoactive bowel sounds.  No masses.  Well-healed scar. Extremities: Grossly normal range of motion.  Warm, well perfused.  No edema bilaterally. GU: Normal appearing external genitalia without erythema, excoriation, or lesions.  Speculum exam reveals cuff intact, suture visible, no bleeding or discharge.  Bimanual exam reveals Intact, no fluctuance or tenderness to palpation.    Laboratory & Radiologic Studies: A. 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  B. 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  C. 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  D. UTERUS, CERVIX, BILATERAL TUBES AND OVARIES: -  1 unoriented fallopian tube involved by high-grade serous carcinoma measuring 12 mm in greatest linear dimension. -  Unremarkable cervix, negative for dysplasia. -  Atrophic appearing endometrium with adenomyosis. -  Overall unremarkable myometrium. -  Posterior reflection/serosa with involvement by the patient's known high-grade serous carcinoma. -  See synoptic report below for further information.  E. 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  G. SMALL BOWEL MESSENTERIC  NODULE: -  Involvement by the patient's known high-grade serous carcinoma.  H. APPENDIX: -  Serosal involvement by the patient's known high-grade serous carcinoma  I. 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   Assessment & Plan: Miranda Hill is a 59 y.o. 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 CARIS: BRCA2 mutation, ER/PR +, HRD neg, PDL1 CPS 1%, P53 mutation, HER2 neg (0) Germline: VUS in CHEK2  Overall doing well.  Discussed continued expectations and postoperative restrictions.  Reviewed bowel function and recommendations for bowel regimen.  She is scheduled to start chemotherapy on 1/16.  Patient was given a copy of her pathology report which we discussed in the hospital.  Given her BRCA2 somatic mutation, she is a candidate for PARPi therapy as maintenance treatment.  I will plan to see the patient back in 5 months.  20 minutes of total time was spent for this patient encounter, including preparation, face-to-face counseling with the patient and coordination of care, and documentation of the encounter.  Comer Dollar, MD  Division of Gynecologic Oncology  Department of Obstetrics and Gynecology  [facility]      [1]  Current Outpatient Medications:    apixaban  (ELIQUIS ) 2.5 MG TABS tablet, Take 1 tablet (2.5 mg total) by mouth 2 (two) times daily., Disp: 42 tablet, Rfl: 0   dexamethasone  (DECADRON ) 4 MG tablet, Take 2 tabs by mouth at the night before and 2 tab the morning of chemotherapy, every 3 weeks, by mouth x 6 cycles, Disp: 24 tablet, Rfl: 6   metFORMIN  (GLUCOPHAGE ) 500 MG tablet, Take 1 tablet (500 mg total) by mouth 2 (two) times daily with a meal., Disp: 60 tablet, Rfl: 1   omeprazole  (PRILOSEC) 40 MG capsule, Take 1 capsule (40 mg total) by mouth  daily before breakfast., Disp: 90 capsule, Rfl: 3   ondansetron  (ZOFRAN ) 4 MG tablet, Take 1 tablet (4 mg total) by mouth every 8 (eight) hours as needed for nausea or vomiting., Disp: 10 tablet, Rfl: 0   polyethylene glycol (MIRALAX  / GLYCOLAX ) 17 g packet, Take 17 g by mouth daily., Disp: 14 each, Rfl: 0   senna (SENOKOT) 8.6 MG TABS tablet, Take 1 tablet by mouth daily., Disp: , Rfl:    senna (SENOKOT) 8.6 MG TABS tablet, Take 1 tablet (8.6 mg total) by mouth 2 (two) times daily as needed for mild constipation., Disp: 25 tablet, Rfl: 1   Trospium  Chloride 60 MG CP24, Take 1 capsule (60 mg total) by mouth daily., Disp: 30 capsule, Rfl: 2   bisacodyl  (DULCOLAX) 5 MG EC tablet, The day before surgery starting at 9:00 am, take 4 Dulcolax tablets with 2 glasses of clear liquid. (Patient not taking: Reported on 02/03/2024), Disp: 4 tablet, Rfl: 0   Multiple Vitamin (MULTIVITAMIN WITH MINERALS) TABS tablet, Take 1 tablet by mouth once a week. Life Extension Two Per  Day Multivitamin (Patient not taking: Reported on 02/03/2024), Disp: , Rfl:    neomycin  (MYCIFRADIN ) 500 MG tablet, The day before surgery, plan to take 2 tablets (1000 mg total) at 2pm, 3 pm, and 10 pm (Patient not taking: Reported on 02/03/2024), Disp: 6 tablet, Rfl: 0   ondansetron  (ZOFRAN ) 8 MG tablet, Take 1 tablet (8 mg total) by mouth every 8 (eight) hours as needed for nausea or vomiting. (Patient not taking: Reported on 02/03/2024), Disp: 60 tablet, Rfl: 1   polyethylene glycol powder (MIRALAX ) 17 GM/SCOOP powder, The day before surgery at 11:00 am, mix whole bottle of Miralax  in 64 oz of Gatorade and drink one 8 oz glass every 15 minutes until gone. (Patient not taking: Reported on 02/03/2024), Disp: 238 g, Rfl: 0   potassium chloride  (KLOR-CON  M) 10 MEQ tablet, Take 1 tablet (10 mEq total) by mouth 2 (two) times daily. (Patient not taking: Reported on 02/03/2024), Disp: 6 tablet, Rfl: 0

## 2024-02-06 NOTE — Progress Notes (Signed)
 [facility] ($4/[facility]) Monitor potassium level, last 3.3 in 01/22/24

## 2024-02-06 NOTE — Patient Instructions (Signed)
 It was good to see you today.  You are healing well from surgery!  Please reach out if you need anything as you are finishing treatment.  I will plan to see you back in 5 months.

## 2024-02-07 ENCOUNTER — Other Ambulatory Visit: Payer: Self-pay

## 2024-02-09 ENCOUNTER — Encounter: Payer: Self-pay | Admitting: *Deleted

## 2024-02-09 ENCOUNTER — Telehealth: Payer: Self-pay | Admitting: *Deleted

## 2024-02-09 NOTE — Telephone Encounter (Signed)
 Dr Guadlupe

## 2024-02-09 NOTE — Telephone Encounter (Signed)
 Spoke with Ms. Marget who called the office stating she had a follow up with Dr. Viktoria on Friday and on Saturday afternoon she passed what looked like a small square of seaweed Pt reports she didn't take a picture and she didn't have her glasses on, so she is not sure exactly what it was. Pt states she then wiped and had blood on the toilet tissue. Pt states this only occurred once and she hasn't seen anything else.   Advised patient to monitor the bleeding and if it occurs again to try and take a picture and call the office back.  Pt denies fever, chills, pain and no other vaginal discharge. Pt is following all post op activity restrictions and has put nothing in the vagina. Pt thanked the office and is aware to call with any concerns or questions.

## 2024-02-12 ENCOUNTER — Other Ambulatory Visit (HOSPITAL_COMMUNITY): Payer: Self-pay

## 2024-02-13 ENCOUNTER — Inpatient Hospital Stay: Admitting: Licensed Clinical Social Worker

## 2024-02-13 ENCOUNTER — Inpatient Hospital Stay

## 2024-02-13 ENCOUNTER — Encounter: Payer: Self-pay | Admitting: Hematology and Oncology

## 2024-02-13 ENCOUNTER — Inpatient Hospital Stay: Admitting: Hematology and Oncology

## 2024-02-13 VITALS — BP 141/84 | HR 94 | Temp 98.2°F | Resp 20 | Wt 166.5 lb

## 2024-02-13 DIAGNOSIS — C569 Malignant neoplasm of unspecified ovary: Secondary | ICD-10-CM

## 2024-02-13 DIAGNOSIS — T451X5A Adverse effect of antineoplastic and immunosuppressive drugs, initial encounter: Secondary | ICD-10-CM

## 2024-02-13 DIAGNOSIS — C786 Secondary malignant neoplasm of retroperitoneum and peritoneum: Secondary | ICD-10-CM | POA: Diagnosis present

## 2024-02-13 DIAGNOSIS — D6481 Anemia due to antineoplastic chemotherapy: Secondary | ICD-10-CM

## 2024-02-13 DIAGNOSIS — C563 Malignant neoplasm of bilateral ovaries: Secondary | ICD-10-CM | POA: Diagnosis present

## 2024-02-13 DIAGNOSIS — Z5111 Encounter for antineoplastic chemotherapy: Secondary | ICD-10-CM | POA: Diagnosis present

## 2024-02-13 LAB — CMP (CANCER CENTER ONLY)
ALT: 22 U/L (ref 0–44)
AST: 18 U/L (ref 15–41)
Albumin: 4.7 g/dL (ref 3.5–5.0)
Alkaline Phosphatase: 76 U/L (ref 38–126)
Anion gap: 14 (ref 5–15)
BUN: 17 mg/dL (ref 6–20)
CO2: 24 mmol/L (ref 22–32)
Calcium: 9.6 mg/dL (ref 8.9–10.3)
Chloride: 102 mmol/L (ref 98–111)
Creatinine: 0.67 mg/dL (ref 0.44–1.00)
GFR, Estimated: >60 mL/min
Glucose, Bld: 198 mg/dL — ABNORMAL HIGH (ref 70–99)
Potassium: 4.2 mmol/L (ref 3.5–5.1)
Sodium: 140 mmol/L (ref 135–145)
Total Bilirubin: 0.6 mg/dL (ref 0.0–1.2)
Total Protein: 7.6 g/dL (ref 6.5–8.1)

## 2024-02-13 LAB — CBC WITH DIFFERENTIAL (CANCER CENTER ONLY)
Abs Immature Granulocytes: 0.03 K/uL (ref 0.00–0.07)
Basophils Absolute: 0 K/uL (ref 0.0–0.1)
Basophils Relative: 0 %
Eosinophils Absolute: 0 K/uL (ref 0.0–0.5)
Eosinophils Relative: 0 %
HCT: 33.4 % — ABNORMAL LOW (ref 36.0–46.0)
Hemoglobin: 11.1 g/dL — ABNORMAL LOW (ref 12.0–15.0)
Immature Granulocytes: 0 %
Lymphocytes Relative: 7 %
Lymphs Abs: 0.5 K/uL — ABNORMAL LOW (ref 0.7–4.0)
MCH: 29.4 pg (ref 26.0–34.0)
MCHC: 33.2 g/dL (ref 30.0–36.0)
MCV: 88.6 fL (ref 80.0–100.0)
Monocytes Absolute: 0.1 K/uL (ref 0.1–1.0)
Monocytes Relative: 1 %
Neutro Abs: 7.6 K/uL (ref 1.7–7.7)
Neutrophils Relative %: 92 %
Platelet Count: 159 K/uL (ref 150–400)
RBC: 3.77 MIL/uL — ABNORMAL LOW (ref 3.87–5.11)
RDW: 14.1 % (ref 11.5–15.5)
WBC Count: 8.3 K/uL (ref 4.0–10.5)
nRBC: 0 % (ref 0.0–0.2)

## 2024-02-13 MED ORDER — APREPITANT 130 MG/18ML IV EMUL
130.0000 mg | Freq: Once | INTRAVENOUS | Status: AC
  Administered 2024-02-13: 130 mg via INTRAVENOUS
  Filled 2024-02-13: qty 18

## 2024-02-13 MED ORDER — FAMOTIDINE IN NACL 20-0.9 MG/50ML-% IV SOLN
20.0000 mg | Freq: Once | INTRAVENOUS | Status: AC
  Administered 2024-02-13: 20 mg via INTRAVENOUS
  Filled 2024-02-13: qty 50

## 2024-02-13 MED ORDER — PALONOSETRON HCL INJECTION 0.25 MG/5ML
0.2500 mg | Freq: Once | INTRAVENOUS | Status: AC
  Administered 2024-02-13: 0.25 mg via INTRAVENOUS
  Filled 2024-02-13: qty 5

## 2024-02-13 MED ORDER — DEXAMETHASONE SOD PHOSPHATE PF 10 MG/ML IJ SOLN
10.0000 mg | Freq: Once | INTRAMUSCULAR | Status: AC
  Administered 2024-02-13: 10 mg via INTRAVENOUS
  Filled 2024-02-13: qty 1

## 2024-02-13 MED ORDER — SODIUM CHLORIDE 0.9 % IV SOLN
175.0000 mg/m2 | Freq: Once | INTRAVENOUS | Status: AC
  Administered 2024-02-13: 318 mg via INTRAVENOUS
  Filled 2024-02-13: qty 53

## 2024-02-13 MED ORDER — SODIUM CHLORIDE 0.9 % IV SOLN: INTRAVENOUS | Status: DC

## 2024-02-13 MED ORDER — SODIUM CHLORIDE 0.9 % IV SOLN
686.4000 mg | Freq: Once | INTRAVENOUS | Status: AC
  Administered 2024-02-13: 690 mg via INTRAVENOUS
  Filled 2024-02-13: qty 69

## 2024-02-13 MED ORDER — CETIRIZINE HCL 10 MG/ML IV SOLN
10.0000 mg | Freq: Once | INTRAVENOUS | Status: AC
  Administered 2024-02-13: 10 mg via INTRAVENOUS
  Filled 2024-02-13: qty 1

## 2024-02-13 MED ORDER — DIPHENHYDRAMINE HCL 50 MG/ML IJ SOLN
25.0000 mg | Freq: Once | INTRAMUSCULAR | Status: AC
  Administered 2024-02-13: 25 mg via INTRAVENOUS
  Filled 2024-02-13: qty 1

## 2024-02-13 NOTE — Progress Notes (Signed)
 [facility] CSW Progress Note  Visual Merchandiser met with patient and husband in infusion to follow-up on need for community resources and emotional support.  Patient has had surgery and is returning for additional chemotherapy. She reports doing fairly well today. She and her husband are grateful that she is feeling much better than when she was first diagnosed. They were able to be home on Christmas with their family as well. They have no resource needs today   Interventions: Provided updated support services calendar      Follow Up Plan:  Patient will contact CSW with any support or resource needs    Florette Thai E Sura Canul, LCSW Clinical Social Worker [facility]    Patient is participating in a Managed Medicaid Plan:  Yes

## 2024-02-13 NOTE — Patient Instructions (Signed)
 [facility] - A DEPT OF [facility]  Discharge Instructions: Thank you for choosing [facility] to provide your oncology and hematology care.   If you have a lab appointment with the Cancer Center, please go directly to the Cancer Center and check in at the registration area.   Wear comfortable clothing and clothing appropriate for easy access to any Portacath or PICC line.   We strive to give you quality time with your provider. You may need to reschedule your appointment if you arrive late (15 or more minutes).  Arriving late affects you and other patients whose appointments are after yours.  Also, if you miss three or more appointments without notifying the office, you may be dismissed from the clinic at the provider's discretion.      For prescription refill requests, have your pharmacy contact our office and allow 72 hours for refills to be completed.    Today you received the following chemotherapy and/or immunotherapy agents: Taxol/Carboplatin      To help prevent nausea and vomiting after your treatment, we encourage you to take your nausea medication as directed.  BELOW ARE SYMPTOMS THAT SHOULD BE REPORTED IMMEDIATELY: *FEVER GREATER THAN 100.4 F (38 C) OR HIGHER *CHILLS OR SWEATING *NAUSEA AND VOMITING THAT IS NOT CONTROLLED WITH YOUR NAUSEA MEDICATION *UNUSUAL SHORTNESS OF BREATH *UNUSUAL BRUISING OR BLEEDING *URINARY PROBLEMS (pain or burning when urinating, or frequent urination) *BOWEL PROBLEMS (unusual diarrhea, constipation, pain near the anus) TENDERNESS IN MOUTH AND THROAT WITH OR WITHOUT PRESENCE OF ULCERS (sore throat, sores in mouth, or a toothache) UNUSUAL RASH, SWELLING OR PAIN  UNUSUAL VAGINAL DISCHARGE OR ITCHING   Items with * indicate a potential emergency and should be followed up as soon as possible or go to the Emergency Department if any problems should occur.  Please show the CHEMOTHERAPY ALERT CARD or  IMMUNOTHERAPY ALERT CARD at check-in to the Emergency Department and triage nurse.  Should you have questions after your visit or need to cancel or reschedule your appointment, please contact [facility] - A DEPT OF [facility]  Dept: [phone]  and follow the prompts.  Office hours are 8:00 a.m. to 4:30 p.m. Monday - Friday. Please note that voicemails left after 4:00 p.m. may not be returned until the following business day.  We are closed weekends and major holidays. You have access to a nurse at all times for urgent questions. Please call the main number to the clinic Dept: [phone] and follow the prompts.   For any non-urgent questions, you may also contact your provider using MyChart. We now offer e-Visits for anyone 51 and older to request care online for non-urgent symptoms. For details visit [email or web address].   Also download the MyChart app! Go to the app store, search "MyChart", open the app, select [place], and log in with your MyChart username and password.

## 2024-02-13 NOTE — Progress Notes (Signed)
 [facility] OFFICE PROGRESS NOTE  Patient Care Team: Miranda Corean CROME, FNP as PCP - General (Family Medicine)  Assessment & Plan Malignant neoplasm of ovary, unspecified laterality (HCC) I have reviewed multiple imaging studies with the patient and her son The patient has developed malignant ascites and abdominal carcinomatosis, source is unknown Her gastroenterologist will arrange for EGD and colonoscopy CT-guided biopsy of peritoneal disease was done, result pending I reviewed recent cytology from paracentesis which came back high-grade serous cancer, that along with elevated CA125, is highly suggestive of either ovarian cancer versus primary peritoneal cancer CARIS 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  Germline mutation testing was negative  She received 4 cycles of neoadjuvant chemotherapy complicated by changes in blood count, initial hypercalcemia which has resolved and improvement on interim CT imaging She underwent surgery, with R1 resection Pathology confirmed high-grade serous carcinoma She is healing well after surgery We will resume 3 more cycles of adjuvant chemotherapy today She had BRCA mutation positive but germline mutation negative She can benefit from combination of PARP inhibitor with bevacizumab in the future after completion of chemotherapy I plan to repeat imaging study in March Anemia due to antineoplastic chemotherapy This is likely due to recent treatment. The patient denies recent history of bleeding such as epistaxis, hematuria or hematochezia. She is asymptomatic from the anemia. I will observe for now.  She does not require transfusion now. I will continue the chemotherapy at current dose without dosage adjustment.  If the anemia gets progressive worse in the future, I might  have to delay her treatment or adjust the chemotherapy dose.   No orders of the defined types were placed in this encounter.    Miranda Bedford, MD  INTERVAL HISTORY: she returns for treatment follow-up She is recovering well from recent surgery Appetite is stable I reviewed blood work, surgical report and pathology report  PHYSICAL EXAMINATION: ECOG PERFORMANCE STATUS: 1 - Symptomatic but completely ambulatory  Lab Results  Component Value Date   CAN125 36.7 12/11/2023   CAN125 185.0 (H) 11/06/2023   CAN125 163.0 (H) 10/23/2023      Latest Ref Rng & Units 02/13/2024    8:45 AM 01/22/2024    3:40 AM 01/21/2024    2:37 AM  CBC  WBC 4.0 - 10.5 K/uL 8.3  7.0  6.7   Hemoglobin 12.0 - 15.0 g/dL 88.8  7.9  7.9   Hematocrit 36.0 - 46.0 % 33.4  24.7  24.7   Platelets 150 - 400 K/uL 159  146  144       Chemistry      Component Value Date/Time   NA 140 02/13/2024 0845   K 4.2 02/13/2024 0845   CL 102 02/13/2024 0845   CO2 24 02/13/2024 0845   BUN 17 02/13/2024 0845   CREATININE 0.67 02/13/2024 0845   CREATININE 0.78 02/18/2018 1553      Component Value Date/Time   CALCIUM 9.6 02/13/2024 0845   ALKPHOS 76 02/13/2024 0845   AST 18 02/13/2024 0845   ALT 22 02/13/2024 0845   BILITOT 0.6 02/13/2024 0845       There were no vitals filed for this visit. There were no vitals filed for this visit. Other relevant data reviewed during this visit included CBC, CMP, surgical report, pathology report

## 2024-02-13 NOTE — Assessment & Plan Note (Addendum)
 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  Germline mutation testing was negative  She received 4 cycles of neoadjuvant chemotherapy complicated by changes in blood count, initial hypercalcemia which has resolved and improvement on interim CT imaging She underwent surgery, with R1 resection Pathology confirmed high-grade serous carcinoma She is healing well after surgery We will resume 3 more cycles of adjuvant chemotherapy today She had BRCA mutation positive but germline mutation negative She can benefit from combination of PARP inhibitor with bevacizumab in the future after completion of chemotherapy I plan to repeat imaging study in March

## 2024-02-13 NOTE — Assessment & Plan Note (Addendum)
 This is likely due to recent treatment. The patient denies recent history of bleeding such as epistaxis, hematuria or hematochezia. She is asymptomatic from the anemia. I will observe for now.  She does not require transfusion now. I will continue the chemotherapy at current dose without dosage adjustment.  If the anemia gets progressive worse in the future, I might have to delay her treatment or adjust the chemotherapy dose.

## 2024-02-14 LAB — CA 125: Cancer Antigen (CA) 125: 16.5 U/mL (ref 0.0–38.1)

## 2024-02-17 ENCOUNTER — Other Ambulatory Visit (HOSPITAL_COMMUNITY): Payer: Self-pay

## 2024-02-17 ENCOUNTER — Other Ambulatory Visit: Payer: Self-pay | Admitting: Obstetrics

## 2024-02-17 ENCOUNTER — Telehealth: Payer: Self-pay

## 2024-02-17 MED ORDER — TOLTERODINE TARTRATE ER 4 MG PO CP24
4.0000 mg | ORAL_CAPSULE | Freq: Every day | ORAL | 0 refills | Status: DC
  Filled 2024-02-17: qty 30, 30d supply, fill #0

## 2024-02-17 NOTE — Telephone Encounter (Signed)
 Patient called the office stating that her insurance has changed to medicaid since [name] is no longer a plan. Patient states the medication is not being approved by her new insurance and she received a letter stating she needs to try other medication before Trospium  Chloride 60 MG CP24.  She would like for her new medication to be sent to either [facility] or [facility]  [address], [place], [place] [address]  Thank you

## 2024-02-17 NOTE — Progress Notes (Signed)
 Rx changed from Trospium  to Detrol  due to insurance requirement.  Please review side effects of anticholinergic medications and the potential side effects of anticholinergics including dry eyes, dry mouth, constipation, cognitive impairment and urinary retention. Stop medication and seek care immediately if Miranda Hill experiences visual changes or inability to void  Consider [facility] Please visit the website below to sign up for an account. We will need an email address to send along with your prescription to verify your prescription once you have signed up.  [email or web address]  Trospium  Chloride ER Capsule Extended Release  60mg   30 count $31.11  Or    Trospium  Chloride (2 times a day dosing) Tablet  20mg   60 count  $14.03

## 2024-02-18 ENCOUNTER — Other Ambulatory Visit (HOSPITAL_COMMUNITY): Payer: Self-pay

## 2024-02-18 MED ORDER — TOLTERODINE TARTRATE ER 4 MG PO CP24: 4.0000 mg | ORAL_CAPSULE | Freq: Every day | ORAL | 0 refills | Status: DC

## 2024-02-18 NOTE — Addendum Note (Signed)
 Addended by: Jossiah Smoak N on: 02/18/2024 11:53 AM   Modules accepted: Orders

## 2024-02-20 ENCOUNTER — Telehealth: Payer: Self-pay

## 2024-02-20 NOTE — Telephone Encounter (Signed)
 Patient wanted to inform us  that DETROL  cause her to have diarrhea and she wants to know if she should continue the medication.

## 2024-02-24 ENCOUNTER — Other Ambulatory Visit (HOSPITAL_COMMUNITY): Payer: Self-pay

## 2024-02-24 ENCOUNTER — Telehealth (HOSPITAL_COMMUNITY): Payer: Self-pay

## 2024-02-24 ENCOUNTER — Ambulatory Visit: Admitting: Obstetrics

## 2024-02-24 ENCOUNTER — Encounter: Payer: Self-pay | Admitting: Obstetrics

## 2024-02-24 VITALS — BP 134/81 | HR 101

## 2024-02-24 DIAGNOSIS — Z48816 Encounter for surgical aftercare following surgery on the genitourinary system: Secondary | ICD-10-CM

## 2024-02-24 DIAGNOSIS — N3946 Mixed incontinence: Secondary | ICD-10-CM

## 2024-02-24 MED ORDER — TROSPIUM CHLORIDE ER 60 MG PO CP24
1.0000 | ORAL_CAPSULE | Freq: Every day | ORAL | 3 refills | Status: DC
  Filled 2024-02-24: qty 90, 90d supply, fill #0

## 2024-02-24 MED ORDER — TROSPIUM CHLORIDE ER 60 MG PO CP24
1.0000 | ORAL_CAPSULE | Freq: Every day | ORAL | 3 refills | Status: AC
  Filled 2024-02-24: qty 90, 90d supply, fill #0
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
## 2024-02-24 NOTE — Patient Instructions (Addendum)
 Please visit the website below to sign up for an account. We will need an email address to send along with your prescription to verify your prescription once you have signed up.  [email or web address]  Trospium  Chloride ER Capsule Extended Release  60mg   30 count $31.11  Or    83.32 for 90 days supply  Please call if you experience any change in urinary or vaginal symptoms.

## 2024-02-25 ENCOUNTER — Encounter: Payer: Self-pay | Admitting: *Deleted

## 2024-02-25 ENCOUNTER — Other Ambulatory Visit (HOSPITAL_COMMUNITY): Payer: Self-pay

## 2024-02-25 DIAGNOSIS — C569 Malignant neoplasm of unspecified ovary: Secondary | ICD-10-CM

## 2024-02-25 NOTE — Research (Signed)
" °   Effectiveness of Out-of-Pocket Psychologist, Forensic ([employer]) in Cancer Patients       Followed up with pt on the phone this afternoon. She stated she is doing well and recovering from surgery. She stated she had her 1st [facility] counseling session in December and is scheduled for another one on 03/01/18 at 3:50pm. She did have a little trouble with Medicaid paying one of her bill but she is working it out. She has not had any additional financial counseling or submitted applications for additional financial assistance. She does not have any additional questions or needs at this time. Pt has this nurse contact information and knows to reach out if she needs further assistance. [employer] registration worksheet has been updated and submitted.   Mazie Larsen, RN, BSN Clinical Research Nurse [phone] 02/25/2024    "

## 2024-02-27 MED ORDER — TROSPIUM CHLORIDE ER 60 MG PO CP24: 1.0000 | ORAL_CAPSULE | Freq: Every day | ORAL | 3 refills | Status: AC

## 2024-02-27 NOTE — Addendum Note (Signed)
 Addended by: Fidelis Loth N on: 02/27/2024 08:41 AM   Modules accepted: Orders

## 2024-03-03 ENCOUNTER — Telehealth: Payer: Self-pay | Admitting: *Deleted

## 2024-03-03 NOTE — Telephone Encounter (Signed)
"   Effectiveness of Out-of-Pocket Psychologist, Forensic ([employer]) in Cancer Patients   Spoke with pt this afternoon regarding her missed [employer] appt. With [facility]. Pt stated she didn't recognize the number and didn't pick up the phone realizing too late that it was [facility]. She stated she did fill out the electronic data systems. Informed pt that I filled out the missed appointment form and someone from the study will be reaching out to her to reschedule. If they do not reach back out to her she will call back and let us  know. Pt did not have any more questions at this time.   Miranda Larsen, RN, BSN Clinical Research Nurse [phone] 03/03/2024   "

## 2024-03-05 ENCOUNTER — Inpatient Hospital Stay: Admitting: Hematology and Oncology

## 2024-03-05 ENCOUNTER — Inpatient Hospital Stay: Admitting: Licensed Clinical Social Worker

## 2024-03-05 ENCOUNTER — Inpatient Hospital Stay

## 2024-03-05 ENCOUNTER — Inpatient Hospital Stay: Attending: Hematology and Oncology

## 2024-03-05 VITALS — BP 153/85 | HR 96 | Temp 97.9°F | Resp 18 | Wt 168.0 lb

## 2024-03-05 DIAGNOSIS — C569 Malignant neoplasm of unspecified ovary: Secondary | ICD-10-CM

## 2024-03-05 DIAGNOSIS — G620 Drug-induced polyneuropathy: Secondary | ICD-10-CM | POA: Insufficient documentation

## 2024-03-05 DIAGNOSIS — D61818 Other pancytopenia: Secondary | ICD-10-CM

## 2024-03-05 LAB — CBC WITH DIFFERENTIAL (CANCER CENTER ONLY)
Abs Immature Granulocytes: 0.05 10*3/uL (ref 0.00–0.07)
Basophils Absolute: 0 10*3/uL (ref 0.0–0.1)
Basophils Relative: 0 %
Eosinophils Absolute: 0 10*3/uL (ref 0.0–0.5)
Eosinophils Relative: 0 %
HCT: 32.9 % — ABNORMAL LOW (ref 36.0–46.0)
Hemoglobin: 11.2 g/dL — ABNORMAL LOW (ref 12.0–15.0)
Immature Granulocytes: 1 %
Lymphocytes Relative: 9 %
Lymphs Abs: 0.6 10*3/uL — ABNORMAL LOW (ref 0.7–4.0)
MCH: 29 pg (ref 26.0–34.0)
MCHC: 34 g/dL (ref 30.0–36.0)
MCV: 85.2 fL (ref 80.0–100.0)
Monocytes Absolute: 0 10*3/uL — ABNORMAL LOW (ref 0.1–1.0)
Monocytes Relative: 1 %
Neutro Abs: 5.8 10*3/uL (ref 1.7–7.7)
Neutrophils Relative %: 89 %
Platelet Count: 118 10*3/uL — ABNORMAL LOW (ref 150–400)
RBC: 3.86 MIL/uL — ABNORMAL LOW (ref 3.87–5.11)
RDW: 14.4 % (ref 11.5–15.5)
Smear Review: NORMAL
WBC Count: 6.5 10*3/uL (ref 4.0–10.5)
nRBC: 0 % (ref 0.0–0.2)

## 2024-03-05 LAB — CMP (CANCER CENTER ONLY)
ALT: 23 U/L (ref 0–44)
AST: 19 U/L (ref 15–41)
Albumin: 4.6 g/dL (ref 3.5–5.0)
Alkaline Phosphatase: 83 U/L (ref 38–126)
Anion gap: 15 (ref 5–15)
BUN: 16 mg/dL (ref 6–20)
CO2: 24 mmol/L (ref 22–32)
Calcium: 9.6 mg/dL (ref 8.9–10.3)
Chloride: 101 mmol/L (ref 98–111)
Creatinine: 0.67 mg/dL (ref 0.44–1.00)
GFR, Estimated: >60 mL/min
Glucose, Bld: 229 mg/dL — ABNORMAL HIGH (ref 70–99)
Potassium: 4.2 mmol/L (ref 3.5–5.1)
Sodium: 140 mmol/L (ref 135–145)
Total Bilirubin: 0.5 mg/dL (ref 0.0–1.2)
Total Protein: 7.5 g/dL (ref 6.5–8.1)

## 2024-03-05 MED ORDER — SODIUM CHLORIDE 0.9 % IV SOLN
686.4000 mg | Freq: Once | INTRAVENOUS | Status: AC
  Administered 2024-03-05: 690 mg via INTRAVENOUS
  Filled 2024-03-05: qty 69

## 2024-03-05 MED ORDER — DEXAMETHASONE SOD PHOSPHATE PF 10 MG/ML IJ SOLN
10.0000 mg | Freq: Once | INTRAMUSCULAR | Status: AC
  Administered 2024-03-05: 10 mg via INTRAVENOUS
  Filled 2024-03-05: qty 1

## 2024-03-05 MED ORDER — FAMOTIDINE IN NACL 20-0.9 MG/50ML-% IV SOLN
20.0000 mg | Freq: Once | INTRAVENOUS | Status: AC
  Administered 2024-03-05: 20 mg via INTRAVENOUS
  Filled 2024-03-05: qty 50

## 2024-03-05 MED ORDER — APREPITANT 130 MG/18ML IV EMUL
130.0000 mg | Freq: Once | INTRAVENOUS | Status: AC
  Administered 2024-03-05: 130 mg via INTRAVENOUS
  Filled 2024-03-05: qty 18

## 2024-03-05 MED ORDER — CETIRIZINE HCL 10 MG/ML IV SOLN
10.0000 mg | Freq: Once | INTRAVENOUS | Status: AC
  Administered 2024-03-05: 10 mg via INTRAVENOUS
  Filled 2024-03-05: qty 1

## 2024-03-05 MED ORDER — SODIUM CHLORIDE 0.9 % IV SOLN
175.0000 mg/m2 | Freq: Once | INTRAVENOUS | Status: AC
  Administered 2024-03-05: 318 mg via INTRAVENOUS
  Filled 2024-03-05: qty 53

## 2024-03-05 MED ORDER — SODIUM CHLORIDE 0.9 % IV SOLN: INTRAVENOUS | Status: DC

## 2024-03-05 MED ORDER — DIPHENHYDRAMINE HCL 50 MG/ML IJ SOLN
25.0000 mg | Freq: Once | INTRAMUSCULAR | Status: AC
  Administered 2024-03-05: 25 mg via INTRAVENOUS
  Filled 2024-03-05: qty 1

## 2024-03-05 MED ORDER — PALONOSETRON HCL INJECTION 0.25 MG/5ML
0.2500 mg | Freq: Once | INTRAVENOUS | Status: AC
  Administered 2024-03-05: 0.25 mg via INTRAVENOUS
  Filled 2024-03-05: qty 5

## 2024-03-05 NOTE — Progress Notes (Signed)
 [facility] OFFICE PROGRESS NOTE  Patient Care Team: Alvia Corean CROME, FNP as PCP - General (Family Medicine)  Assessment & Plan Malignant neoplasm of ovary, unspecified laterality (HCC) I have reviewed multiple imaging studies with the patient and her son The patient has developed malignant ascites and abdominal carcinomatosis, source is unknown Her gastroenterologist will arrange for EGD and colonoscopy CT-guided biopsy of peritoneal disease was done, result pending I reviewed recent cytology from paracentesis which came back high-grade serous cancer, that along with elevated CA125, is highly suggestive of either ovarian cancer versus primary peritoneal cancer [facility] 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  Germline mutation testing was negative  She received 4 cycles of neoadjuvant chemotherapy complicated by changes in blood count, initial hypercalcemia which has resolved and improvement on interim CT imaging She underwent surgery, with R1 resection Pathology confirmed high-grade serous carcinoma She is healing well after surgery She tolerated last cycle of adjuvant chemotherapy well except for some mild peripheral neuropathy, fatigue and constipation She had BRCA mutation positive but germline mutation negative She can benefit from combination of PARP inhibitor with bevacizumab in the future after completion of chemotherapy I plan to repeat imaging study in March after completion of adjuvant treatment Pancytopenia, [name] She is pancytopenic due to treatment but not symptomatic Observe only 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 It is only mild, not bothering the patient. I will observe for now If it gets  worse in the future, I will consider modifying the dose of the treatment   No orders of the defined types were placed in this encounter.    Almarie Bedford, MD  INTERVAL HISTORY: she returns for treatment follow-up Complications related to previous cycle of chemotherapy included pancytopenia,, peripheral neuropathy,, and fatigue, She has mild neuropathy in the feet but not bothering her She had initial diarrhea due to medications for her bladder but once she stopped that, she developed mild constipation  PHYSICAL EXAMINATION: ECOG PERFORMANCE STATUS: 1 - Symptomatic but completely ambulatory  Lab Results  Component Value Date   CAN125 16.5 02/13/2024   CAN125 36.7 12/11/2023   CAN125 185.0 (H) 11/06/2023      Latest Ref Rng & Units 03/05/2024    8:00 AM 02/13/2024    8:45 AM 01/22/2024    3:40 AM  CBC  WBC 4.0 - 10.5 K/uL 6.5  8.3  7.0   Hemoglobin 12.0 - 15.0 g/dL 88.7  88.8  7.9   Hematocrit 36.0 - 46.0 % 32.9  33.4  24.7   Platelets 150 - 400 K/uL 118  159  146       Chemistry      Component Value Date/Time   NA 140 03/05/2024 0800   K 4.2 03/05/2024 0800   CL 101 03/05/2024 0800   CO2 24 03/05/2024 0800   BUN 16 03/05/2024 0800   CREATININE 0.67 03/05/2024 0800   CREATININE 0.78 02/18/2018 1553      Component Value Date/Time   CALCIUM 9.6 03/05/2024 0800   ALKPHOS 83 03/05/2024 0800   AST 19 03/05/2024 0800   ALT 23 03/05/2024 0800   BILITOT 0.5 03/05/2024 0800       There were no vitals filed for this visit. There were no vitals filed for this visit.  Other relevant data reviewed during this visit included CBC, CMP, CA125

## 2024-03-05 NOTE — Assessment & Plan Note (Addendum)
 She is pancytopenic due to treatment but not symptomatic Observe only

## 2024-03-05 NOTE — Progress Notes (Signed)
 [facility] CSW Progress Note  Visual Merchandiser met with patient to follow-up on emotional support.   Interventions: Provided brief mental health counseling with regard to adjustment to cancer and interpersonal stressors    Pt shared some frustration and disappointment she has had with different relationships, some family and some friends, and the response she has received in regard to support at this time. Allowed space for patient to share and process. 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     Follow Up Plan:  Patient will contact CSW with any support or resource needs    Hulda Reddix E Jewelene Mairena, LCSW Clinical Social Worker [facility]    Patient is participating in a Managed Medicaid Plan:  Yes

## 2024-03-05 NOTE — Assessment & Plan Note (Addendum)
she has mild peripheral neuropathy, likely related to side effects of treatment. It is only mild, not bothering the patient. I will observe for now If it gets worse in the future, I will consider modifying the dose of the treatment  

## 2024-03-05 NOTE — Patient Instructions (Signed)
 [facility] - A DEPT OF [facility]  Discharge Instructions: Thank you for choosing [facility] to provide your oncology and hematology care.   If you have a lab appointment with the Cancer Center, please go directly to the Cancer Center and check in at the registration area.   Wear comfortable clothing and clothing appropriate for easy access to any Portacath or PICC line.   We strive to give you quality time with your provider. You may need to reschedule your appointment if you arrive late (15 or more minutes).  Arriving late affects you and other patients whose appointments are after yours.  Also, if you miss three or more appointments without notifying the office, you may be dismissed from the clinic at the provider's discretion.      For prescription refill requests, have your pharmacy contact our office and allow 72 hours for refills to be completed.    Today you received the following chemotherapy and/or immunotherapy agents: Taxol/Carboplatin      To help prevent nausea and vomiting after your treatment, we encourage you to take your nausea medication as directed.  BELOW ARE SYMPTOMS THAT SHOULD BE REPORTED IMMEDIATELY: *FEVER GREATER THAN 100.4 F (38 C) OR HIGHER *CHILLS OR SWEATING *NAUSEA AND VOMITING THAT IS NOT CONTROLLED WITH YOUR NAUSEA MEDICATION *UNUSUAL SHORTNESS OF BREATH *UNUSUAL BRUISING OR BLEEDING *URINARY PROBLEMS (pain or burning when urinating, or frequent urination) *BOWEL PROBLEMS (unusual diarrhea, constipation, pain near the anus) TENDERNESS IN MOUTH AND THROAT WITH OR WITHOUT PRESENCE OF ULCERS (sore throat, sores in mouth, or a toothache) UNUSUAL RASH, SWELLING OR PAIN  UNUSUAL VAGINAL DISCHARGE OR ITCHING   Items with * indicate a potential emergency and should be followed up as soon as possible or go to the Emergency Department if any problems should occur.  Please show the CHEMOTHERAPY ALERT CARD or  IMMUNOTHERAPY ALERT CARD at check-in to the Emergency Department and triage nurse.  Should you have questions after your visit or need to cancel or reschedule your appointment, please contact [facility] - A DEPT OF [facility]  Dept: [phone]  and follow the prompts.  Office hours are 8:00 a.m. to 4:30 p.m. Monday - Friday. Please note that voicemails left after 4:00 p.m. may not be returned until the following business day.  We are closed weekends and major holidays. You have access to a nurse at all times for urgent questions. Please call the main number to the clinic Dept: [phone] and follow the prompts.   For any non-urgent questions, you may also contact your provider using MyChart. We now offer e-Visits for anyone 51 and older to request care online for non-urgent symptoms. For details visit [email or web address].   Also download the MyChart app! Go to the app store, search "MyChart", open the app, select [place], and log in with your MyChart username and password.

## 2024-03-05 NOTE — Assessment & Plan Note (Addendum)
 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  Germline mutation testing was negative  She received 4 cycles of neoadjuvant chemotherapy complicated by changes in blood count, initial hypercalcemia which has resolved and improvement on interim CT imaging She underwent surgery, with R1 resection Pathology confirmed high-grade serous carcinoma She is healing well after surgery She tolerated last cycle of adjuvant chemotherapy well except for some mild peripheral neuropathy, fatigue and constipation She had BRCA mutation positive but germline mutation negative She can benefit from combination of PARP inhibitor with bevacizumab in the future after completion of chemotherapy I plan to repeat imaging study in March after completion of adjuvant treatment

## 2024-03-26 ENCOUNTER — Inpatient Hospital Stay

## 2024-03-26 ENCOUNTER — Inpatient Hospital Stay: Admitting: Licensed Clinical Social Worker

## 2024-03-26 ENCOUNTER — Inpatient Hospital Stay: Admitting: Hematology and Oncology

## 2024-04-05 ENCOUNTER — Ambulatory Visit: Admitting: Family Medicine

## 2024-04-06 ENCOUNTER — Encounter: Admitting: Obstetrics

## 2024-07-08 ENCOUNTER — Inpatient Hospital Stay: Admitting: Gynecologic Oncology
